# Patient Record
Sex: Male | Born: 1945 | Race: White | Hispanic: No | Marital: Married | Smoking: Never smoker
Health system: Southern US, Community
[De-identification: ages and names within clinical notes are randomized; demographics above are authoritative.]

## PROBLEM LIST (undated history)

## (undated) DIAGNOSIS — R351 Nocturia: Secondary | ICD-10-CM

## (undated) DIAGNOSIS — R972 Elevated prostate specific antigen [PSA]: Principal | ICD-10-CM

## (undated) DIAGNOSIS — F329 Major depressive disorder, single episode, unspecified: Secondary | ICD-10-CM

## (undated) DIAGNOSIS — L719 Rosacea, unspecified: Secondary | ICD-10-CM

## (undated) DIAGNOSIS — I10 Essential (primary) hypertension: Secondary | ICD-10-CM

## (undated) DIAGNOSIS — F32A Depression, unspecified: Secondary | ICD-10-CM

## (undated) DIAGNOSIS — E785 Hyperlipidemia, unspecified: Secondary | ICD-10-CM

## (undated) DIAGNOSIS — N401 Enlarged prostate with lower urinary tract symptoms: Secondary | ICD-10-CM

## (undated) HISTORY — DX: Elevated prostate specific antigen (PSA): R97.20

## (undated) HISTORY — DX: Essential (primary) hypertension: I10

## (undated) HISTORY — DX: Nocturia: R35.1

## (undated) HISTORY — DX: Major depressive disorder, single episode, unspecified: F32.9

## (undated) HISTORY — DX: Hyperlipidemia, unspecified: E78.5

## (undated) HISTORY — PX: WISDOM TOOTH EXTRACTION: SHX21

## (undated) HISTORY — DX: Benign prostatic hyperplasia with lower urinary tract symptoms: N40.1

## (undated) HISTORY — DX: Depression, unspecified: F32.A

## (undated) HISTORY — PX: TONSILLECTOMY: SUR1361

## (undated) HISTORY — DX: Rosacea, unspecified: L71.9

## (undated) HISTORY — PX: FLEXIBLE SIGMOIDOSCOPY: SHX1649

---

## 2003-04-25 LAB — HM SIGMOIDOSCOPY

## 2004-04-02 ENCOUNTER — Encounter: Payer: Self-pay | Admitting: Internal Medicine

## 2004-07-04 ENCOUNTER — Ambulatory Visit: Payer: Self-pay | Admitting: Internal Medicine

## 2004-10-02 ENCOUNTER — Ambulatory Visit: Payer: Self-pay | Admitting: Internal Medicine

## 2004-10-10 ENCOUNTER — Ambulatory Visit: Payer: Self-pay | Admitting: Internal Medicine

## 2005-04-09 ENCOUNTER — Ambulatory Visit: Payer: Self-pay | Admitting: Internal Medicine

## 2005-04-16 ENCOUNTER — Ambulatory Visit: Payer: Self-pay | Admitting: Internal Medicine

## 2005-06-30 ENCOUNTER — Ambulatory Visit: Payer: Self-pay | Admitting: Internal Medicine

## 2005-07-23 ENCOUNTER — Ambulatory Visit: Payer: Self-pay | Admitting: Internal Medicine

## 2005-11-04 ENCOUNTER — Ambulatory Visit: Payer: Self-pay | Admitting: Internal Medicine

## 2005-11-11 ENCOUNTER — Ambulatory Visit: Payer: Self-pay | Admitting: Internal Medicine

## 2006-03-03 ENCOUNTER — Ambulatory Visit: Payer: Self-pay | Admitting: Internal Medicine

## 2006-03-10 ENCOUNTER — Ambulatory Visit: Payer: Self-pay | Admitting: Internal Medicine

## 2006-03-24 ENCOUNTER — Ambulatory Visit: Payer: Self-pay | Admitting: Internal Medicine

## 2006-04-09 ENCOUNTER — Ambulatory Visit: Payer: Self-pay | Admitting: Internal Medicine

## 2006-04-15 ENCOUNTER — Encounter: Admission: RE | Admit: 2006-04-15 | Discharge: 2006-04-15 | Payer: Self-pay | Admitting: Internal Medicine

## 2006-05-18 ENCOUNTER — Ambulatory Visit: Payer: Self-pay | Admitting: Internal Medicine

## 2006-06-15 ENCOUNTER — Ambulatory Visit: Payer: Self-pay | Admitting: Internal Medicine

## 2006-06-15 LAB — CONVERTED CEMR LAB
CO2: 29 meq/L (ref 19–32)
Creatinine, Ser: 1.7 mg/dL — ABNORMAL HIGH (ref 0.4–1.5)
Potassium: 5.2 meq/L — ABNORMAL HIGH (ref 3.5–5.1)
Sodium: 146 meq/L — ABNORMAL HIGH (ref 135–145)

## 2006-06-22 ENCOUNTER — Ambulatory Visit: Payer: Self-pay | Admitting: Internal Medicine

## 2006-07-21 ENCOUNTER — Ambulatory Visit: Payer: Self-pay | Admitting: Internal Medicine

## 2007-02-25 ENCOUNTER — Ambulatory Visit: Payer: Self-pay | Admitting: Internal Medicine

## 2007-02-25 LAB — CONVERTED CEMR LAB
Albumin: 4.4 g/dL (ref 3.5–5.2)
GFR calc Af Amer: 47 mL/min
GFR calc non Af Amer: 39 mL/min
HDL: 43.6 mg/dL (ref >39.0)
Hgb A1c MFr Bld: 6.1 % — ABNORMAL HIGH (ref 4.6–6.0)
LDL Cholesterol: 87 mg/dL (ref 0–99)
Potassium: 4.4 meq/L (ref 3.5–5.1)
Sodium: 142 meq/L (ref 135–145)
Total Bilirubin: 0.8 mg/dL (ref 0.3–1.2)
Total CHOL/HDL Ratio: 3.8
Triglycerides: 172 mg/dL — ABNORMAL HIGH (ref 0–149)
VLDL: 34 mg/dL (ref 0–40)

## 2007-05-19 ENCOUNTER — Encounter: Payer: Self-pay | Admitting: Internal Medicine

## 2007-05-20 ENCOUNTER — Encounter: Payer: Self-pay | Admitting: Internal Medicine

## 2007-05-24 DIAGNOSIS — E785 Hyperlipidemia, unspecified: Secondary | ICD-10-CM | POA: Insufficient documentation

## 2007-05-24 DIAGNOSIS — F329 Major depressive disorder, single episode, unspecified: Secondary | ICD-10-CM | POA: Insufficient documentation

## 2007-05-24 DIAGNOSIS — F3289 Other specified depressive episodes: Secondary | ICD-10-CM | POA: Insufficient documentation

## 2007-05-31 ENCOUNTER — Ambulatory Visit: Payer: Self-pay | Admitting: Internal Medicine

## 2007-05-31 DIAGNOSIS — I10 Essential (primary) hypertension: Secondary | ICD-10-CM | POA: Insufficient documentation

## 2007-05-31 LAB — CONVERTED CEMR LAB
Cholesterol, target level: 200 mg/dL
HDL goal, serum: 40 mg/dL

## 2007-09-27 ENCOUNTER — Ambulatory Visit: Payer: Self-pay | Admitting: Internal Medicine

## 2007-09-27 LAB — CONVERTED CEMR LAB
AST: 21 units/L (ref 0–37)
Basophils Relative: 0.1 % (ref 0.0–1.0)
Bilirubin, Direct: 0.2 mg/dL (ref 0.0–0.3)
CO2: 28 meq/L (ref 19–32)
Chloride: 106 meq/L (ref 96–112)
Creatinine, Ser: 1.8 mg/dL — ABNORMAL HIGH (ref 0.4–1.5)
Eosinophils Relative: 1.5 % (ref 0.0–5.0)
Glucose, Bld: 102 mg/dL — ABNORMAL HIGH (ref 70–99)
Glucose, Urine, Semiquant: NEGATIVE
HCT: 37.8 % — ABNORMAL LOW (ref 39.0–52.0)
MCV: 88.2 fL (ref 78.0–100.0)
Neutrophils Relative %: 67.3 % (ref 43.0–77.0)
PSA: 1.06 ng/mL (ref 0.10–4.00)
Protein, U semiquant: NEGATIVE
RBC: 4.29 M/uL (ref 4.22–5.81)
RDW: 12.6 % (ref 11.5–14.6)
Sodium: 143 meq/L (ref 135–145)
Total Bilirubin: 0.7 mg/dL (ref 0.3–1.2)
Total CHOL/HDL Ratio: 3.9
WBC Urine, dipstick: NEGATIVE
WBC: 7.1 10*3/uL (ref 4.5–10.5)
pH: 5.5

## 2007-10-04 ENCOUNTER — Ambulatory Visit: Payer: Self-pay | Admitting: Internal Medicine

## 2008-03-29 ENCOUNTER — Ambulatory Visit: Payer: Self-pay | Admitting: Internal Medicine

## 2008-03-29 LAB — CONVERTED CEMR LAB
ALT: 20 units/L (ref 0–53)
AST: 24 units/L (ref 0–37)
Albumin: 4.3 g/dL (ref 3.5–5.2)
BUN: 27 mg/dL — ABNORMAL HIGH (ref 6–23)
Basophils Absolute: 0 10*3/uL (ref 0.0–0.1)
Basophils Relative: 0.3 % (ref 0.0–3.0)
CO2: 28 meq/L (ref 19–32)
Calcium: 9.4 mg/dL (ref 8.4–10.5)
Chloride: 104 meq/L (ref 96–112)
Creatinine, Ser: 1.9 mg/dL — ABNORMAL HIGH (ref 0.4–1.5)
Direct LDL: 116.8 mg/dL
Eosinophils Relative: 1.8 % (ref 0.0–5.0)
Hemoglobin: 12.8 g/dL — ABNORMAL LOW (ref 13.0–17.0)
Lymphocytes Relative: 29.2 % (ref 12.0–46.0)
MCHC: 34.5 g/dL (ref 30.0–36.0)
MCV: 89.8 fL (ref 78.0–100.0)
Neutro Abs: 3.7 10*3/uL (ref 1.4–7.7)
Nitrite: NEGATIVE
PSA: 1.13 ng/mL (ref 0.10–4.00)
Protein, U semiquant: NEGATIVE
RBC: 4.13 M/uL — ABNORMAL LOW (ref 4.22–5.81)
Specific Gravity, Urine: 1.02
Total Bilirubin: 0.8 mg/dL (ref 0.3–1.2)
Total Protein: 7.1 g/dL (ref 6.0–8.3)
Urobilinogen, UA: 0.2
VLDL: 47 mg/dL — ABNORMAL HIGH (ref 0–40)
WBC: 5.9 10*3/uL (ref 4.5–10.5)
pH: 5.5

## 2008-04-05 ENCOUNTER — Ambulatory Visit: Payer: Self-pay | Admitting: Internal Medicine

## 2008-04-05 DIAGNOSIS — N183 Chronic kidney disease, stage 3 (moderate): Secondary | ICD-10-CM

## 2008-07-04 ENCOUNTER — Ambulatory Visit: Payer: Self-pay | Admitting: Internal Medicine

## 2008-07-04 LAB — CONVERTED CEMR LAB
ALT: 21 units/L (ref 0–53)
AST: 27 units/L (ref 0–37)
Bilirubin, Direct: 0.1 mg/dL (ref 0.0–0.3)
CO2: 27 meq/L (ref 19–32)
Chloride: 107 meq/L (ref 96–112)
Creatinine, Ser: 2 mg/dL — ABNORMAL HIGH (ref 0.4–1.5)
GFR calc non Af Amer: 36 mL/min
LDL Cholesterol: 85 mg/dL (ref 0–99)
Potassium: 5.1 meq/L (ref 3.5–5.1)
Sodium: 142 meq/L (ref 135–145)
Total Bilirubin: 0.7 mg/dL (ref 0.3–1.2)
Total CHOL/HDL Ratio: 3.7
Triglycerides: 144 mg/dL (ref 0–149)

## 2008-07-11 ENCOUNTER — Ambulatory Visit: Payer: Self-pay | Admitting: Internal Medicine

## 2008-11-09 ENCOUNTER — Ambulatory Visit: Payer: Self-pay | Admitting: Internal Medicine

## 2008-11-09 LAB — CONVERTED CEMR LAB
ALT: 21 units/L (ref 0–53)
Alkaline Phosphatase: 23 units/L — ABNORMAL LOW (ref 39–117)
BUN: 22 mg/dL (ref 6–23)
Bilirubin, Direct: 0 mg/dL (ref 0.0–0.3)
CO2: 28 meq/L (ref 19–32)
Calcium: 9.3 mg/dL (ref 8.4–10.5)
GFR calc non Af Amer: 40.94 mL/min (ref >60)
Glucose, Bld: 99 mg/dL (ref 70–99)
HDL: 42.8 mg/dL (ref >39.00)
Sodium: 142 meq/L (ref 135–145)
Total Bilirubin: 0.8 mg/dL (ref 0.3–1.2)
Total Protein: 6.9 g/dL (ref 6.0–8.3)
VLDL: 42.6 mg/dL — ABNORMAL HIGH (ref 0.0–40.0)

## 2008-11-16 ENCOUNTER — Ambulatory Visit: Payer: Self-pay | Admitting: Internal Medicine

## 2008-11-16 DIAGNOSIS — L719 Rosacea, unspecified: Secondary | ICD-10-CM | POA: Insufficient documentation

## 2009-03-16 ENCOUNTER — Telehealth: Payer: Self-pay | Admitting: Internal Medicine

## 2009-03-16 ENCOUNTER — Encounter: Payer: Self-pay | Admitting: Internal Medicine

## 2009-04-03 ENCOUNTER — Ambulatory Visit: Payer: Self-pay | Admitting: Internal Medicine

## 2009-04-03 LAB — CONVERTED CEMR LAB
AST: 25 units/L (ref 0–37)
BUN: 33 mg/dL — ABNORMAL HIGH (ref 6–23)
Basophils Absolute: 0 10*3/uL (ref 0.0–0.1)
Bilirubin Urine: NEGATIVE
Bilirubin, Direct: 0.1 mg/dL (ref 0.0–0.3)
Blood in Urine, dipstick: NEGATIVE
Calcium: 9.4 mg/dL (ref 8.4–10.5)
Cholesterol: 162 mg/dL (ref 0–200)
Creatinine, Ser: 2.2 mg/dL — ABNORMAL HIGH (ref 0.4–1.5)
GFR calc non Af Amer: 32.43 mL/min (ref >60)
Glucose, Bld: 110 mg/dL — ABNORMAL HIGH (ref 70–99)
Glucose, Urine, Semiquant: NEGATIVE
HDL: 41.1 mg/dL (ref >39.00)
LDL Cholesterol: 86 mg/dL (ref 0–99)
Lymphocytes Relative: 24.5 % (ref 12.0–46.0)
Lymphs Abs: 1.7 10*3/uL (ref 0.7–4.0)
Monocytes Relative: 7.8 % (ref 3.0–12.0)
Neutrophils Relative %: 66.1 % (ref 43.0–77.0)
Platelets: 344 10*3/uL (ref 150.0–400.0)
Protein, U semiquant: NEGATIVE
RDW: 12.9 % (ref 11.5–14.6)
TSH: 3.13 microintl units/mL (ref 0.35–5.50)
Total Bilirubin: 0.7 mg/dL (ref 0.3–1.2)
Urobilinogen, UA: 0.2
VLDL: 34.8 mg/dL (ref 0.0–40.0)
WBC: 6.9 10*3/uL (ref 4.5–10.5)
pH: 5.5

## 2009-04-13 ENCOUNTER — Ambulatory Visit: Payer: Self-pay | Admitting: Internal Medicine

## 2009-05-11 ENCOUNTER — Ambulatory Visit: Payer: Self-pay | Admitting: Internal Medicine

## 2009-05-11 LAB — CONVERTED CEMR LAB
BUN: 19 mg/dL (ref 6–23)
CO2: 30 meq/L (ref 19–32)
Chloride: 108 meq/L (ref 96–112)
Potassium: 4.2 meq/L (ref 3.5–5.1)

## 2009-05-17 ENCOUNTER — Ambulatory Visit: Payer: Self-pay | Admitting: Internal Medicine

## 2009-05-17 ENCOUNTER — Telehealth: Payer: Self-pay | Admitting: Internal Medicine

## 2009-06-21 ENCOUNTER — Ambulatory Visit: Payer: Self-pay | Admitting: Internal Medicine

## 2009-06-21 DIAGNOSIS — M109 Gout, unspecified: Secondary | ICD-10-CM

## 2009-06-27 LAB — CONVERTED CEMR LAB: Uric Acid, Serum: 7.1 mg/dL (ref 4.0–7.8)

## 2009-09-12 ENCOUNTER — Ambulatory Visit: Payer: Self-pay | Admitting: Internal Medicine

## 2009-09-12 LAB — CONVERTED CEMR LAB
CO2: 29 meq/L (ref 19–32)
Calcium: 9.3 mg/dL (ref 8.4–10.5)
Cholesterol: 181 mg/dL (ref 0–200)
GFR calc non Af Amer: 43.61 mL/min (ref >60)
HDL: 45.4 mg/dL (ref >39.00)
Sodium: 142 meq/L (ref 135–145)

## 2009-09-24 ENCOUNTER — Telehealth: Payer: Self-pay | Admitting: Internal Medicine

## 2009-12-05 ENCOUNTER — Ambulatory Visit: Payer: Self-pay | Admitting: Internal Medicine

## 2009-12-05 LAB — CONVERTED CEMR LAB
GFR calc non Af Amer: 40.8 mL/min (ref >60)
Potassium: 4.8 meq/L (ref 3.5–5.1)
Sodium: 142 meq/L (ref 135–145)

## 2009-12-12 ENCOUNTER — Ambulatory Visit: Payer: Self-pay | Admitting: Internal Medicine

## 2010-04-11 ENCOUNTER — Ambulatory Visit: Payer: Self-pay | Admitting: Internal Medicine

## 2010-04-11 LAB — CONVERTED CEMR LAB
AST: 24 units/L (ref 0–37)
BUN: 27 mg/dL — ABNORMAL HIGH (ref 6–23)
Basophils Absolute: 0 10*3/uL (ref 0.0–0.1)
Blood in Urine, dipstick: NEGATIVE
Calcium: 9.6 mg/dL (ref 8.4–10.5)
Direct LDL: 108.3 mg/dL
Eosinophils Absolute: 0.2 10*3/uL (ref 0.0–0.7)
GFR calc non Af Amer: 37.38 mL/min (ref >60)
Glucose, Bld: 101 mg/dL — ABNORMAL HIGH (ref 70–99)
Glucose, Urine, Semiquant: NEGATIVE
HCT: 36.3 % — ABNORMAL LOW (ref 39.0–52.0)
HDL: 41.5 mg/dL (ref >39.00)
Ketones, urine, test strip: NEGATIVE
Lymphocytes Relative: 27.5 % (ref 12.0–46.0)
Lymphs Abs: 1.6 10*3/uL (ref 0.7–4.0)
Monocytes Relative: 8 % (ref 3.0–12.0)
PSA: 4.31 ng/mL — ABNORMAL HIGH (ref 0.10–4.00)
Platelets: 325 10*3/uL (ref 150.0–400.0)
RDW: 14 % (ref 11.5–14.6)
Specific Gravity, Urine: 1.02
TSH: 3 microintl units/mL (ref 0.35–5.50)
Total Bilirubin: 0.6 mg/dL (ref 0.3–1.2)
Triglycerides: 224 mg/dL — ABNORMAL HIGH (ref 0.0–149.0)
pH: 6

## 2010-04-16 ENCOUNTER — Ambulatory Visit: Payer: Self-pay | Admitting: Internal Medicine

## 2010-04-16 DIAGNOSIS — R972 Elevated prostate specific antigen [PSA]: Secondary | ICD-10-CM | POA: Insufficient documentation

## 2010-04-16 HISTORY — DX: Elevated prostate specific antigen (PSA): R97.20

## 2010-05-28 ENCOUNTER — Ambulatory Visit: Payer: Self-pay | Admitting: Internal Medicine

## 2010-05-28 LAB — CONVERTED CEMR LAB: PSA: 1.64 ng/mL (ref 0.10–4.00)

## 2010-06-06 ENCOUNTER — Ambulatory Visit: Payer: Self-pay | Admitting: Internal Medicine

## 2010-09-25 NOTE — Assessment & Plan Note (Signed)
Summary: 3 mo rov/mm---PTS WIFE [facility] // RS   Vital Signs:  Patient profile:   65 year old male Height:      68 inches Weight:      183 pounds BMI:     27.93 Temp:     98.2 degrees F oral Pulse rate:   68 / minute Resp:     14 per minute BP sitting:   120 / 70  (left arm)  Vitals Entered By: Willy Eddy, LPN (September 12, 2009 11:29 AM) CC: roa, Hypertension Management   CC:  roa and Hypertension Management.  History of Present Illness: MOTHER WENT INTO ASSISTED LIVING INCREASED STRESS  Hypertension History:      He denies headache, chest pain, palpitations, dyspnea with exertion, orthopnea, PND, peripheral edema, visual symptoms, neurologic problems, syncope, and side effects from treatment.  HX OF RENAL INSUFFICIENCY.        Positive major cardiovascular risk factors include male age 64 years old or older, hyperlipidemia, and hypertension.  Negative major cardiovascular risk factors include no history of diabetes and non-tobacco-user status.        Positive history for target organ damage include renal insufficiency.  Further assessment for target organ damage reveals no history of ASHD, stroke/TIA, or peripheral vascular disease.     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 Once Daily 2)  Adult Aspirin Low Strength 81 Mg  Tbdp (Aspirin) .... Once Daily 3)  Benicar 40 Mg Tabs (Olmesartan Medoxomil) .... One By Mouth Daily 4)  Folic Acid 1 Mg  Tabs (Folic Acid) .... Once Daily 5)  Vitamin B-6 Cr 200 Mg  Tbcr (Pyridoxine Hcl) .... Once  Daily 6)  Therapeutic Multivitamin   Tabs (Multiple Vitamin) .... Once Daily 7)  Tazorac 0.1 %  Crea (Tazarotene) .... Apply To Face Daily 8)  Lipitor 40 Mg  Tabs (Atorvastatin Calcium) .... One By Mouth Daily 9)  Clindamycin Phos-Benzoyl Perox 1-5 % Gel (Clindamycin Phos-Benzoyl Perox) .... Apply To Face As Needed 10)  Colchicine 0.6 Mg Tabs (Colchicine) .... One By Mouth Two Times A Day  For Acute Gour  Current Medications (verified): 1)  Tricor 145 Mg  Tabs (Fenofibrate) .... Once Daily 2)  Adult Aspirin Low Strength 81 Mg  Tbdp (Aspirin) .... Once Daily 3)  Benicar 40 Mg Tabs (Olmesartan Medoxomil) .... One By Mouth Daily 4)  Folic Acid 1 Mg  Tabs (Folic Acid) .... Once Daily 5)  Vitamin B-6 Cr 200 Mg  Tbcr (Pyridoxine Hcl) .... Once Daily 6)  Therapeutic Multivitamin   Tabs (Multiple Vitamin) .... Once Daily 7)  Lipitor 40 Mg  Tabs (Atorvastatin Calcium) .... One By Mouth Daily 8)  Clindamycin Phos-Benzoyl Perox 1-5 % Gel (Clindamycin Phos-Benzoyl Perox) .... Apply To Face As Needed 9)  Colchicine 0.6 Mg Tabs (Colchicine) .... One By Mouth Two Times A Day  For Acute Gour  Allergies (verified): No Known Drug Allergies  Past History:  Family History: Last updated: 05/24/2007 Family History of Cardiovascular disorder  Social History: Last updated: 05/24/2007 Occupation: Married Never Smoked  Alcohol use-yes  Risk Factors: Smoking Status: never (09/12/2009)  Past medical, surgical, family and social histories (including risk factors) reviewed, and no changes noted (except as noted below).  Past Medical History: Reviewed history from 05/31/2007 and no changes required. Rosacea Depression Hyperlipidemia Hypertension  Past Surgical History: Reviewed history from 05/24/2007 and no changes required. Flexible Sigmoidoscopy Wisdom Teeth  Family History: Reviewed history from 05/24/2007 and no changes required. Family History of Cardiovascular disorder  Social  History: Reviewed history from 05/24/2007 and no changes required. Occupation: Married Never Smoked Alcohol use-yes  Review of Systems  The patient denies anorexia, fever, weight loss, weight gain, vision loss, decreased hearing, hoarseness, chest pain, syncope, dyspnea on exertion, peripheral edema, prolonged cough, headaches, hemoptysis, abdominal pain, melena, hematochezia, severe indigestion/heartburn, hematuria, incontinence, genital sores, muscle weakness, suspicious skin lesions, transient blindness, difficulty walking, depression, unusual weight change, abnormal bleeding, enlarged lymph nodes, angioedema, and breast masses.    Physical Exam  General:  Well-developed,well-nourished,in no acute distress; alert,appropriate and cooperative throughout examination Head:  normocephalic and male-pattern balding.   Eyes:  pupils equal and pupils round.   Ears:  R ear normal and L ear normal.   Mouth:  Oral mucosa and oropharynx without lesions or exudates.  Teeth in good repair. Neck:  No deformities, masses, or tenderness noted. Lungs:  normal respiratory effort and no wheezes.   Heart:  normal rate and regular rhythm.   Abdomen:  soft and non-tender.   Msk:  normal ROM, no joint tenderness, no joint swelling, no joint warmth, and no redness over joints.   Pulses:  R and L carotid,radial,femoral,dorsalis pedis and posterior tibial pulses are full and equal bilaterally Extremities:  trace left pedal edema.   Neurologic:  alert & oriented X3 and finger-to-nose normal.     Impression & Recommendations:  Problem # 1:  CHRONIC KIDNEY DISEASE STAGE II (MILD) (ICD-585.2) Assessment Unchanged  Labs Reviewed: BUN: 19 (05/11/2009)   Cr: 2.0 (05/11/2009)    Hgb: 12.9 (04/03/2009)   Hct: 37.4 (04/03/2009)   Ca++: 9.4 (05/11/2009)    TP: 7.2 (04/03/2009)   Alb: 4.4 (04/03/2009)  Problem # 2:  DISEASE, HTN CKD, NOS, W/CKD, STG I-IV/UNSPC (ICD-403.90) Assessment: Unchanged  His updated  medication list for this problem includes:    Benicar 40 Mg Tabs (Olmesartan medoxomil) ..... One by mouth daily  Orders: TLB-BMP (Basic Metabolic Panel-BMET) (80048-METABOL)  BP today: 120/70 Prior BP: 110/80 (06/21/2009)  Prior 10 Yr Risk Heart Disease: 7 % (07/11/2008)  Labs Reviewed: K+: 4.2 (05/11/2009) Creat: : 2.0 (05/11/2009)   Chol: 162 (04/03/2009)   HDL: 41.10 (04/03/2009)   LDL: 86 (04/03/2009)   TG: 174.0 (04/03/2009)  Problem # 3:  ACUTE GOUTY ARTHROPATHY (ICD-274.01)  MONITER URIC ACID His updated medication list for this problem includes:    Colchicine 0.6 Mg Tabs (Colchicine) ..... One by mouth two times a day  for acute gour  Orders: TLB-Uric Acid, Blood (84550-URIC)  Elevate extremity; warm compresses, symptomatic relief and medication as directed.   Complete Medication List: 1)  Tricor 145 Mg Tabs (Fenofibrate) .... Once daily 2)  Adult Aspirin Low Strength 81 Mg Tbdp (Aspirin) .... Once daily 3)  Benicar 40 Mg Tabs (Olmesartan medoxomil) .... One by mouth daily 4)  Folic Acid 1 Mg Tabs (Folic acid) .... Once daily 5)  Vitamin B-6 Cr 200 Mg Tbcr (Pyridoxine hcl) .... Once daily 6)  Therapeutic Multivitamin Tabs (Multiple vitamin) .... Once daily 7)  Lipitor 40 Mg Tabs (Atorvastatin calcium) .Marland KitchenMarland KitchenMarland Kitchen  One by mouth daily 8)  Clindamycin Phos-benzoyl Perox 1-5 % Gel (Clindamycin phos-benzoyl perox) .... Apply to face as needed 9)  Colchicine 0.6 Mg Tabs (Colchicine) .... One by mouth two times a day  for acute gour  Other Orders: Venipuncture (93235) TLB-Cholesterol, HDL (83718-HDL) TLB-Cholesterol, Direct LDL (83721-DIRLDL) TLB-Cholesterol, Total (82465-CHO)  Hypertension Assessment/Plan:      The patient's hypertensive risk group is category C: Target organ damage and/or diabetes.  His calculated 10 year risk of coronary heart disease is 7 %.  Today's blood pressure is 120/70.  His blood pressure goal is < 140/90.  Patient Instructions: 1)  Please schedule  a follow-up appointment in 3 months. 2)  BMP prior to visit, ICD-9:403.9

## 2010-09-25 NOTE — Progress Notes (Signed)
Summary: zpack?  Phone Note Call from Patient   Caller: Patient Call For: Stacie Glaze MD Summary of Call: Pt is coughing up green sputum, and would like a Zpack.  [facility].  Not sure about fever, but feels ill all over? [phone] Initial call taken by: Lynann Beaver CMA,  September 24, 2009 4:29 PM  Follow-up for Phone Call        per dr Lovell Sheehan- may have z pack Follow-up by: Willy Eddy, LPN,  September 24, 2009 4:41 PM  Additional Follow-up for Phone Call Additional follow up Details #1::        Phone Call Completed Additional Follow-up by: Rudy Jew, RN,  September 24, 2009 4:50 PM    New/Updated Medications: ZITHROMAX Z-PAK 250 MG TABS (AZITHROMYCIN) As directed. Prescriptions: ZITHROMAX Z-PAK 250 MG TABS (AZITHROMYCIN) As directed.  #1 x 0   Entered by:   Rudy Jew, RN   Authorized by:   Stacie Glaze MD   Signed by:   Rudy Jew, RN on 09/24/2009   Method used:   [facility]        [phone] (retail)       [address]       [place], [place]  [address]       Ph: [phone]       Fax: [phone]   RxID:   [id number]

## 2010-09-25 NOTE — Assessment & Plan Note (Signed)
Summary: CPX//CCM   Vital Signs:  Patient profile:   65 year old male Height:      68 inches Weight:      182 pounds BMI:     27.77 Temp:     98.1 degrees F oral Pulse rate:   72 / minute Resp:     14 per minute BP sitting:   110 / 64  (left arm)  Vitals Entered By: Willy Eddy, LPN (April 16, 2010 11:26 AM) CC: cpx- flex in 2002, Hypertension Management Is Patient Diabetic? No   CC:  cpx- flex in 2002 and Hypertension Management.  History of Present Illness: The pt was asked about all immunizations, health maint. services that are appropriate to their age and was given guidance on diet exercize  and weight management  rise in PSA without symptoms no back pain, dyuria or freqency milde progressive anemia with hx of renal dz blood pressure stable  Hypertension History:      He denies headache, chest pain, palpitations, dyspnea with exertion, orthopnea, PND, peripheral edema, visual symptoms, neurologic problems, syncope, and side effects from treatment.        Positive major cardiovascular risk factors include male age 79 years old or older, hyperlipidemia, and hypertension.  Negative major cardiovascular risk factors include no history of diabetes and non-tobacco-user status.        Positive history for target organ damage include renal insufficiency.  Further assessment for target organ damage reveals no history of ASHD, stroke/TIA, or peripheral vascular disease.     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 Once Daily 2)  Adult Aspirin Low Strength 81 Mg  Tbdp (Aspirin) .... Once Daily 3)  Benicar 40 Mg Tabs (Olmesartan Medoxomil) .... One By Mouth Daily 4)  Folic Acid 1 Mg  Tabs (Folic Acid) .... Once Daily 5)  Vitamin B-6 Cr 200 Mg  Tbcr (Pyridoxine Hcl) .... Once Daily 6)  Therapeutic Multivitamin   Tabs (Multiple Vitamin) .... Once Daily 7)  Lipitor 40 Mg  Tabs (Atorvastatin Calcium) .... One By Mouth Daily 8)  Clindamycin Phos-Benzoyl Perox 1-5 % Gel (Clindamycin Phos-Benzoyl Perox) .... Apply To Face As Needed 9)  Colcrys 0.6 Mg Tabs (Colchicine) .... One By Mouth  Two Times A Day Prn  Current Medications (verified): 1)  Tricor 145 Mg  Tabs (Fenofibrate) .... Once Daily 2)  Adult Aspirin Low Strength 81 Mg  Tbdp (Aspirin) .... Once Daily 3)  Benicar 40 Mg Tabs (Olmesartan Medoxomil) .... One By Mouth Daily 4)  Folic Acid 1 Mg  Tabs (Folic Acid) .... Once Daily 5)  Vitamin B-6 Cr 200 Mg  Tbcr (Pyridoxine Hcl) .... Once Daily 6)  Therapeutic Multivitamin   Tabs (Multiple Vitamin) .... Once Daily  7)  Lipitor 40 Mg  Tabs (Atorvastatin Calcium) .... One By Mouth Daily 8)  Clindamycin Phos-Benzoyl Perox 1-5 % Gel (Clindamycin Phos-Benzoyl Perox) .... Apply To Face As Needed 9)  Colcrys 0.6 Mg Tabs (Colchicine) .... One By Mouth  Two Times A Day Prn 10)  Ciprofloxacin Hcl 250 Mg Tabs (Ciprofloxacin Hcl) .... One By Mouth Two Times A Day For 21 Days  Allergies (verified): No Known Drug Allergies  Past History:  Family History: Last updated: 05/24/2007 Family History of Cardiovascular disorder  Social History: Last updated: 05/24/2007 Occupation: Married Never Smoked Alcohol  use-yes  Risk Factors: Smoking Status: never (04/16/2010)  Past medical, surgical, family and social histories (including risk factors) reviewed, and no changes noted (except as noted below).  Past Medical History: Reviewed history from 05/31/2007 and no changes required. Rosacea Depression Hyperlipidemia Hypertension  Past Surgical History: Reviewed history from 05/24/2007 and no changes required. Flexible Sigmoidoscopy Wisdom Teeth  Family History: Reviewed history from 05/24/2007 and no changes required. Family History of Cardiovascular disorder  Social History: Reviewed history from 05/24/2007 and no changes required. Occupation: Married Never Smoked Alcohol use-yes  Review of Systems  The patient denies anorexia, fever, weight loss, weight gain, vision loss, decreased hearing, hoarseness, chest pain, syncope, dyspnea on exertion, peripheral edema, prolonged cough, headaches, hemoptysis, abdominal pain, melena, hematochezia, severe indigestion/heartburn, hematuria, incontinence, genital sores, muscle weakness, suspicious skin lesions, transient blindness, difficulty walking, depression, unusual weight change, abnormal bleeding, enlarged lymph nodes, angioedema, and breast masses.    Physical Exam  General:  Well-developed,well-nourished,in no acute distress; alert,appropriate and cooperative throughout examination Head:  normocephalic and male-pattern balding.   Eyes:  pupils equal and pupils round.   Ears:  R ear normal and L ear normal.   Mouth:  Oral mucosa and oropharynx without lesions or exudates.  Teeth in good repair. Neck:  No deformities, masses, or tenderness noted. Lungs:  normal respiratory effort and no wheezes.   Heart:  normal rate and regular rhythm.   Abdomen:  soft and non-tender.   Genitalia:  circumcised and no urethral discharge.   Prostate:  no gland enlargement, boggy, and 1+ enlarged.   Msk:  normal ROM, no joint tenderness, no joint  swelling, no joint warmth, and no redness over joints.   Pulses:  R and L carotid,radial,femoral,dorsalis pedis and posterior tibial pulses are full and equal bilaterally Extremities:  trace left pedal edema.   Neurologic:  alert & oriented X3 and finger-to-nose normal.     Impression & Recommendations:  Problem # 1:  PHYSICAL EXAMINATION (ICD-V70.0) The pt was asked about all immunizations, health maint. services that are appropriate to their age and was given guidance on diet exercize  and weight management  Td Booster: Tdap (04/05/2008)   Flu Vax: Fluvax 3+ (05/17/2009)   Chol: 181 (09/12/2009)   HDL: 45.40 (09/12/2009)   LDL: 86 (04/03/2009)   TG: 174.0 (04/03/2009) TSH: 3.13 (04/03/2009)   HgbA1C: 6.1 (02/25/2007)   PSA: 1.17 (04/03/2009)  Discussed using sunscreen, use of alcohol, drug use, self testicular exam, routine dental care, routine eye care, routine physical exam, seat belts, multiple vitamins, osteoporosis prevention, adequate calcium intake in diet, and recommendations for immunizations.  Discussed exercise and checking cholesterol.  Discussed gun safety, safe sex, and contraception. Also recommend checking PSA.  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 One by mouth daily  BP today: 110/64 Prior BP: 110/70 (12/12/2009)  Prior 10 Yr Risk Heart Disease: 6 % (12/12/2009)  Labs Reviewed: K+: 4.8 (12/05/2009) Creat: : 1.8 (12/05/2009)   Chol: 181 (09/12/2009)   HDL: 45.40 (09/12/2009)   LDL: 86 (04/03/2009)   TG: 174.0 (04/03/2009)  Problem # 4:  PSA, INCREASED  (ICD-790.93) treat with cipro 250 two times a day and repeat  Complete Medication List: 1)  Tricor 145 Mg Tabs (Fenofibrate) .... Once daily 2)  Adult Aspirin Low Strength 81 Mg Tbdp (Aspirin) .... Once daily 3)  Benicar 40 Mg Tabs (Olmesartan medoxomil) .... One by mouth daily 4)  Folic Acid 1 Mg Tabs (Folic acid) .... Once daily 5)  Vitamin B-6 Cr 200 Mg Tbcr (Pyridoxine hcl) .... Once daily 6)  Therapeutic Multivitamin Tabs (Multiple vitamin) .... Once daily 7)  Lipitor 40 Mg Tabs (Atorvastatin calcium) .... One by mouth daily 8)  Clindamycin Phos-benzoyl Perox 1-5 % Gel (Clindamycin phos-benzoyl perox) .... Apply to face as needed 9)  Colcrys 0.6 Mg Tabs (Colchicine) .... One by mouth  two times a day prn 10)  Ciprofloxacin Hcl 250 Mg Tabs (Ciprofloxacin hcl) .... One by mouth two times a day for 21 days  Hypertension Assessment/Plan:      The patient's hypertensive risk group is category C: Target organ damage and/or diabetes.  His calculated 10 year risk of coronary heart disease is 6 %.  Today's blood pressure is 110/64.  His blood pressure goal is < 140/90.  Patient Instructions: 1)  Please schedule a follow-up appointment in 6 weeks. 2)  PSA prior to visit, ICD-9: 790.93 Prescriptions: CIPROFLOXACIN HCL 250 MG TABS (CIPROFLOXACIN HCL) one by mouth two times a day for 21 days  #42 x 0   Entered and Authorized by:   Stacie Glaze MD   Signed by:   Stacie Glaze MD on 04/16/2010   Method used:   [facility]ically to        [phone][facility] (retail)       [address]       [place], [place]  [address]       Ph: [phone]       Fax: [phone]   RxID:   [id number]

## 2010-09-25 NOTE — Assessment & Plan Note (Signed)
Summary: FU ON LAB WORK/NJR   Vital Signs:  Patient profile:   65 year old male Height:      68 inches Weight:      178 pounds BMI:     27.16 Temp:     98.2 degrees F oral Pulse rate:   72 / minute Resp:     14 per minute BP sitting:   120 / 70  (left arm)  Vitals Entered By: Willy Eddy, LPN (June 06, 2010 10:28 AM) CC: roa labs, Hypertension Management Is Patient Diabetic? No   Primary Care [facility]o:  Stacie Glaze MD  CC:  roa labs and Hypertension Management.  History of Present Illness: the pt present for follow up of PSA monitering for labs follow up for increased stressors with mother in hospital bloos pressure and reanl function stable wit creatinne of 1.9 lipids stable  Hypertension History:      He denies headache, chest pain, palpitations, dyspnea with exertion, orthopnea, PND, peripheral edema, visual symptoms, neurologic problems, syncope, and side effects from treatment.        Positive major cardiovascular risk factors include male age 60 years old or older, hyperlipidemia, and hypertension.  Negative major cardiovascular risk factors include no history of diabetes and non-tobacco-user status.        Positive history for target organ damage include renal insufficiency.  Further assessment for target organ damage reveals no history of ASHD, stroke/TIA, or peripheral vascular disease.     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 Once Daily 2)  Adult Aspirin Low Strength 81 Mg  Tbdp (Aspirin) .... Once Daily 3)  Benicar 40 Mg Tabs (Olmesartan Medoxomil) .... One By Mouth Daily 4)  Folic Acid 1 Mg  Tabs (Folic Acid) .... Once Daily 5)  Vitamin B-6 Cr 200 Mg  Tbcr (Pyridoxine Hcl) .... Once Daily 6)  Therapeutic Multivitamin   Tabs (Multiple Vitamin) .... Once Daily 7)  Lipitor 40 Mg  Tabs (Atorvastatin Calcium) .... One By Mouth Daily 8)  Clindamycin Phos-Benzoyl Perox 1-5 % Gel (Clindamycin Phos-Benzoyl Perox) .... Apply To Face As Needed 9)  Colcrys 0.6 Mg Tabs (Colchicine) .... One By Mouth  Two Times A Day Prn  Current Medications (verified): 1)  Tricor 145 Mg  Tabs (Fenofibrate) .... Once Daily 2)  Adult Aspirin Low Strength 81 Mg  Tbdp (Aspirin) .... Once Daily 3)  Benicar 40 Mg Tabs (Olmesartan Medoxomil) .... One By Mouth Daily 4)  Folic Acid 1 Mg  Tabs (Folic Acid) .... Once Daily 5)  Vitamin B-6 Cr 200 Mg  Tbcr (Pyridoxine Hcl) .... Once  Daily 6)  Therapeutic Multivitamin   Tabs (Multiple Vitamin) .... Once Daily 7)  Lipitor 40 Mg  Tabs (Atorvastatin Calcium) .... One By Mouth Daily 8)  Clindamycin Phos-Benzoyl Perox 1-5 % Gel (Clindamycin Phos-Benzoyl Perox) .... Apply To Face As Needed 9)  Colcrys 0.6 Mg Tabs (Colchicine) .... One By Mouth  Two Times A Day Prn  Allergies (verified): No Known Drug Allergies  Past History:  Family History: Last updated: 05/24/2007 Family History of Cardiovascular disorder  Social History: Last updated: 05/24/2007 Occupation: Married Never Smoked Alcohol use-yes  Risk Factors: Smoking Status: never  (06/06/2010)  Past medical, surgical, family and social histories (including risk factors) reviewed, and no changes noted (except as noted below).  Past Medical History: Reviewed history from 05/31/2007 and no changes required. Rosacea Depression Hyperlipidemia Hypertension  Past Surgical History: Reviewed history from 05/24/2007 and no changes required. Flexible Sigmoidoscopy Wisdom Teeth  Family History: Reviewed history from 05/24/2007 and no changes required. Family History of Cardiovascular disorder  Social History: Reviewed history from 05/24/2007 and no changes required. Occupation: Married Never Smoked Alcohol use-yes  Review of Systems       Flu Vaccine Consent Questions     Do you have a history of severe allergic reactions to this vaccine? no    Any prior history of allergic reactions to egg and/or gelatin? no    Do you have a sensitivity to the preservative Thimersol? no    Do you have a past history of Guillan-Barre Syndrome? no    Do you currently have an acute febrile illness? no    Have you ever had a severe reaction to latex? no    Vaccine information given and explained to patient? yes    Are you currently pregnant? no    Lot Number:[id number]   Exp Date:02/22/2011   Site Given  Left Deltoid IM   Physical Exam  General:  Well-developed,well-nourished,in no acute distress; alert,appropriate and cooperative throughout examination Head:  normocephalic and male-pattern balding.   Eyes:  pupils equal and pupils round.   Ears:  R ear normal and L ear normal.   Mouth:  Oral mucosa and oropharynx without lesions or exudates.  Teeth in good repair. Neck:  No deformities, masses, or tenderness noted. Lungs:  normal respiratory effort and no wheezes.   Heart:  normal rate and regular rhythm.   Abdomen:  soft and non-tender.     Impression & Recommendations:  Problem # 1:  PSA, INCREASED (ICD-790.93) esignificant psa drop with abx therfor will moniter  as scheduled in august  Problem # 2:  ACNE ROSACEA (ICD-695.3) monitering  Problem # 3:  CHRONIC KIDNEY DISEASE STAGE II (MILD) (ICD-585.2)  stabe creatinine  Labs Reviewed: BUN: 27 (04/11/2010)   Cr: 1.9 (04/11/2010)    Hgb: 12.3 (04/11/2010)   Hct: 36.3 (04/11/2010)   Ca++: 9.6 (04/11/2010)    TP: 6.6 (04/11/2010)   Alb: 4.3 (04/11/2010)  Problem # 4:  DISEASE, HTN CKD, NOS, W/CKD, STG I-IV/UNSPC (ICD-403.90)  His updated medication list for this problem includes:    Benicar 40 Mg Tabs (Olmesartan medoxomil) ..... One by mouth daily  BP today: 120/70 Prior BP: 110/64 (04/16/2010)  10 Yr Risk Heart Disease: 7 % Prior 10 Yr Risk Heart Disease: 6 % (12/12/2009)  Labs Reviewed: K+: 5.0 (04/11/2010) Creat: : 1.9 (04/11/2010)   Chol: 173 (04/11/2010)   HDL: 41.50 (04/11/2010)   LDL: 86 (04/03/2009)   TG: 224.0 (04/11/2010)  Complete Medication List: 1)  Tricor 145 Mg  Tabs (Fenofibrate) .... Once daily 2)  Adult Aspirin Low Strength 81 Mg Tbdp (Aspirin) .... Once daily 3)  Benicar 40 Mg Tabs (Olmesartan medoxomil) .... One by mouth daily 4)  Folic Acid 1 Mg Tabs (Folic acid) .... Once daily 5)  Vitamin B-6 Cr 200 Mg Tbcr (Pyridoxine hcl) .... Once daily 6)  Therapeutic Multivitamin Tabs (Multiple vitamin) .... Once daily 7)  Lipitor 40 Mg Tabs (Atorvastatin calcium) .... One by mouth daily 8)  Clindamycin Phos-benzoyl Perox 1-5 % Gel (Clindamycin phos-benzoyl perox) .... Apply to face as needed 9)  Colcrys 0.6 Mg Tabs (Colchicine) .... One by mouth  two times a day prn  Other Orders: Admin 1st Vaccine (16109) Flu Vaccine 49yrs + (60454)  Hypertension Assessment/Plan:      The patient's hypertensive risk group is category C: Target organ damage and/or diabetes.  His calculated 10 year risk of coronary heart disease is 7 %.  Today's blood pressure is 120/70.  His blood pressure goal is < 140/90.  Patient Instructions: 1)  CPX in august

## 2010-09-25 NOTE — Assessment & Plan Note (Signed)
Summary: 3 month rov/njr   Vital Signs:  Patient profile:   65 year old male Height:      68 inches Weight:      183 pounds BMI:     27.93 Temp:     98.2 degrees F oral Pulse rate:   72 / minute Resp:     14 per minute BP sitting:   110 / 70  (left arm)  Vitals Entered By: Willy Eddy, LPN (December 12, 2009 10:36 AM) CC: roa labs-takes colchicine about once a month for flare-?maintenance med?, Lipid Management, Hypertension Management   CC:  roa labs-takes colchicine about once a month for flare-?maintenance med?, Lipid Management, and Hypertension Management.  History of Present Illness: the pt has gout and has colchicine and has used this "up" and wonderers about colycris followed for stability of HTN and renal functions using the benicar and the benicar card use...  Hypertension History:      Positive major cardiovascular risk factors include male age 60 years old or older, hyperlipidemia, and hypertension.  Negative major cardiovascular risk factors include no history of diabetes and non-tobacco-user status.        Positive history for target organ damage include renal insufficiency.  Further assessment for target organ damage reveals no history of ASHD, stroke/TIA, or peripheral vascular disease.    Lipid Management History:      Positive NCEP/ATP III risk factors include male age 63 years old or older and hypertension.  Negative NCEP/ATP III risk factors include non-diabetic, non-tobacco-user status, no ASHD (atherosclerotic heart disease), no prior stroke/TIA, no peripheral vascular disease, and no history of aortic aneurysm.      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 Once Daily 2)  Adult Aspirin Low Strength 81 Mg  Tbdp (Aspirin) .... Once Daily 3)  Benicar 40 Mg Tabs (Olmesartan Medoxomil) .... One By Mouth Daily 4)  Folic Acid 1 Mg  Tabs (Folic Acid) .... Once Daily 5)  Vitamin B-6 Cr 200 Mg  Tbcr (Pyridoxine Hcl) .... Once Daily 6)  Therapeutic Multivitamin   Tabs (Multiple Vitamin) .... Once Daily 7)  Lipitor 40 Mg  Tabs (Atorvastatin Calcium) .... One By Mouth Daily 8)  Clindamycin Phos-Benzoyl Perox 1-5 % Gel (Clindamycin Phos-Benzoyl Perox) .... Apply To Face As Needed 9)  Colchicine 0.6 Mg Tabs (Colchicine) .... One By Mouth Two Times A Day  For Acute Gour 10)  Zithromax Z-Pak 250 Mg Tabs (Azithromycin) .... As Directed.  Current Medications (verified): 1)  Tricor 145 Mg  Tabs (Fenofibrate) .... Once Daily 2)  Adult Aspirin Low Strength 81 Mg  Tbdp (Aspirin) .... Once Daily 3)  Benicar 40 Mg Tabs (Olmesartan Medoxomil) .... One By Mouth Daily 4)  Folic Acid 1  Mg  Tabs (Folic Acid) .... Once Daily 5)  Vitamin B-6 Cr 200 Mg  Tbcr (Pyridoxine Hcl) .... Once Daily 6)  Therapeutic Multivitamin   Tabs (Multiple Vitamin) .... Once Daily 7)  Lipitor 40 Mg  Tabs (Atorvastatin Calcium) .... One By Mouth Daily 8)  Clindamycin Phos-Benzoyl Perox 1-5 % Gel (Clindamycin Phos-Benzoyl Perox) .... Apply To Face As Needed 9)  Colcrys 0.6 Mg Tabs (Colchicine) .... One By Mouth  Two Times A Day Prn  Allergies (verified): No Known Drug Allergies  Past History:  Family History: Last updated:  05/24/2007 Family History of Cardiovascular disorder  Social History: Last updated: 05/24/2007 Occupation: Married Never Smoked Alcohol use-yes  Risk Factors: Smoking Status: never (12/12/2009)  Past medical, surgical, family and social histories (including risk factors) reviewed, and no changes noted (except as noted below).  Past Medical History: Reviewed history from 05/31/2007 and no changes required. Rosacea Depression Hyperlipidemia Hypertension  Past Surgical History: Reviewed history from 05/24/2007 and no changes required. Flexible Sigmoidoscopy Wisdom Teeth  Family History: Reviewed history from 05/24/2007 and no changes required. Family History of Cardiovascular disorder  Social History: Reviewed history from 05/24/2007 and no changes required. Occupation: Married Never Smoked Alcohol use-yes  Review of Systems  The patient denies anorexia, fever, weight loss, weight gain, vision loss, decreased hearing, hoarseness, chest pain, syncope, dyspnea on exertion, peripheral edema, prolonged cough, headaches, hemoptysis, abdominal pain, melena, hematochezia, severe indigestion/heartburn, hematuria, incontinence, genital sores, muscle weakness, suspicious skin lesions, transient blindness, difficulty walking, depression, unusual weight change, abnormal bleeding, enlarged lymph nodes, angioedema, and breast masses.    Physical Exam  General:  Well-developed,well-nourished,in no acute distress; alert,appropriate and cooperative throughout examination Head:  normocephalic and male-pattern balding.   Eyes:  pupils equal and pupils round.   Ears:  R ear normal and L ear normal.   Mouth:  Oral mucosa and oropharynx without lesions or exudates.  Teeth in good repair. Neck:  No deformities, masses, or tenderness noted. Lungs:  normal respiratory effort and no wheezes.   Heart:  normal rate and regular rhythm.   Abdomen:  soft and non-tender.   Msk:  normal ROM, no  joint tenderness, no joint swelling, no joint warmth, and no redness over joints.   Pulses:  R and L carotid,radial,femoral,dorsalis pedis and posterior tibial pulses are full and equal bilaterally Extremities:  trace left pedal edema.     Impression & Recommendations:  Problem # 1:  CHRONIC KIDNEY DISEASE STAGE II (MILD) (ICD-585.2) stable /imporved Labs Reviewed: BUN: 23 (12/05/2009)   Cr: 1.8 (12/05/2009)    Hgb: 12.9 (04/03/2009)   Hct: 37.4 (04/03/2009)   Ca++: 9.2 (12/05/2009)    TP: 7.2 (04/03/2009)   Alb: 4.4 (04/03/2009)  Problem # 2:  DISEASE, HTN CKD, NOS, W/CKD, STG I-IV/UNSPC (ICD-403.90) one 1/2 table a day His updated medication list for this problem includes:    Benicar 40 Mg Tabs (Olmesartan medoxomil) ..... One by mouth daily  BP today: 110/70 Prior BP: 120/70 (09/12/2009)  Prior 10 Yr Risk Heart Disease: 7 % (07/11/2008)  Labs Reviewed: K+: 4.8 (12/05/2009) Creat: : 1.8 (12/05/2009)   Chol: 181 (09/12/2009)   HDL: 45.40 (09/12/2009)   LDL: 86 (04/03/2009)   TG: 174.0 (04/03/2009)  Problem # 3:  HYPERLIPIDEMIA (ICD-272.4)  His updated medication list for this problem includes:    Tricor 145 Mg Tabs (Fenofibrate) ..... Once daily    Lipitor 40 Mg Tabs (Atorvastatin calcium) ..... One by mouth daily  Labs Reviewed: SGOT: 25 (  04/03/2009)   SGPT: 21 (04/03/2009)  Lipid Goals: Chol Goal: 200 (05/31/2007)   HDL Goal: 40 (05/31/2007)   LDL Goal: 130 (05/31/2007)   TG Goal: 150 (05/31/2007)  Prior 10 Yr Risk Heart Disease: 7 % (07/11/2008)   HDL:45.40 (09/12/2009), 41.10 (04/03/2009)  LDL:86 (04/03/2009), 85 (07/04/2008)  Chol:181 (09/12/2009), 162 (04/03/2009)  Trig:174.0 (04/03/2009), 213.0 (11/09/2008)  Problem # 4:  DEPRESSION (ICD-311)  Discussed treatment options, including trial of antidpressant medication. Will refer to behavioral health. Follow-up call in in 24-48 hours and recheck in 2 weeks, sooner as needed. Patient agrees to call if any worsening of  symptoms or thoughts of doing harm arise. Verified that the patient has no suicidal ideation at this time.   Complete Medication List: 1)  Tricor 145 Mg Tabs (Fenofibrate) .... Once daily 2)  Adult Aspirin Low Strength 81 Mg Tbdp (Aspirin) .... Once daily 3)  Benicar 40 Mg Tabs (Olmesartan medoxomil) .... One by mouth daily 4)  Folic Acid 1 Mg Tabs (Folic acid) .... Once daily 5)  Vitamin B-6 Cr 200 Mg Tbcr (Pyridoxine hcl) .... Once daily 6)  Therapeutic Multivitamin Tabs (Multiple vitamin) .... Once daily 7)  Lipitor 40 Mg Tabs (Atorvastatin calcium) .... One by mouth daily 8)  Clindamycin Phos-benzoyl Perox 1-5 % Gel (Clindamycin phos-benzoyl perox) .... Apply to face as needed 9)  Colcrys 0.6 Mg Tabs (Colchicine) .... One by mouth  two times a day prn  Hypertension Assessment/Plan:      The patient's hypertensive risk group is category C: Target organ damage and/or diabetes.  His calculated 10 year risk of coronary heart disease is 6 %.  Today's blood pressure is 110/70.  His blood pressure goal is < 140/90.  Lipid Assessment/Plan:      Based on NCEP/ATP III, the patient's risk factor category is "2 or more risk factors and a calculated 10 year CAD risk of < 20%".  The patient's lipid goals are as follows: Total cholesterol goal is 200; LDL cholesterol goal is 130; HDL cholesterol goal is 40; Triglyceride goal is 150.  His LDL cholesterol goal has been met.    Patient Instructions: 1)  August CPX

## 2010-11-29 ENCOUNTER — Other Ambulatory Visit: Payer: Self-pay | Admitting: Internal Medicine

## 2011-01-25 ENCOUNTER — Other Ambulatory Visit: Payer: Self-pay | Admitting: Internal Medicine

## 2011-04-15 ENCOUNTER — Other Ambulatory Visit (INDEPENDENT_AMBULATORY_CARE_PROVIDER_SITE_OTHER): Payer: BC Managed Care – PPO

## 2011-04-15 DIAGNOSIS — Z0000 Encounter for general adult medical examination without abnormal findings: Secondary | ICD-10-CM

## 2011-04-15 LAB — CBC WITH DIFFERENTIAL/PLATELET
Basophils Relative: 0.4 % (ref 0.0–3.0)
Eosinophils Relative: 3.7 % (ref 0.0–5.0)
Hemoglobin: 12 g/dL — ABNORMAL LOW (ref 13.0–17.0)
Lymphocytes Relative: 26.9 % (ref 12.0–46.0)
Monocytes Relative: 8.8 % (ref 3.0–12.0)
Neutro Abs: 3.7 10*3/uL (ref 1.4–7.7)
Neutrophils Relative %: 60.2 % (ref 43.0–77.0)
RBC: 3.98 Mil/uL — ABNORMAL LOW (ref 4.22–5.81)
WBC: 6.2 10*3/uL (ref 4.5–10.5)

## 2011-04-15 LAB — POCT URINALYSIS DIPSTICK
Glucose, UA: NEGATIVE
Ketones, UA: NEGATIVE
Leukocytes, UA: NEGATIVE
Spec Grav, UA: 1.02
Urobilinogen, UA: 0.2

## 2011-04-15 LAB — HEPATIC FUNCTION PANEL
AST: 26 U/L (ref 0–37)
Albumin: 4.4 g/dL (ref 3.5–5.2)
Alkaline Phosphatase: 24 U/L — ABNORMAL LOW (ref 39–117)
Bilirubin, Direct: 0.1 mg/dL (ref 0.0–0.3)
Total Protein: 6.6 g/dL (ref 6.0–8.3)

## 2011-04-15 LAB — TSH: TSH: 2.74 u[IU]/mL (ref 0.35–5.50)

## 2011-04-15 LAB — BASIC METABOLIC PANEL
CO2: 26 mEq/L (ref 19–32)
Calcium: 9.4 mg/dL (ref 8.4–10.5)
GFR: 32.39 mL/min — ABNORMAL LOW (ref >60.00)
Sodium: 141 mEq/L (ref 135–145)

## 2011-04-15 LAB — PSA: PSA: 1.65 ng/mL (ref 0.10–4.00)

## 2011-04-22 ENCOUNTER — Encounter: Payer: Self-pay | Admitting: Internal Medicine

## 2011-04-22 ENCOUNTER — Ambulatory Visit (INDEPENDENT_AMBULATORY_CARE_PROVIDER_SITE_OTHER): Payer: BC Managed Care – PPO | Admitting: Internal Medicine

## 2011-04-22 DIAGNOSIS — Z0000 Encounter for general adult medical examination without abnormal findings: Secondary | ICD-10-CM

## 2011-04-22 DIAGNOSIS — N182 Chronic kidney disease, stage 2 (mild): Secondary | ICD-10-CM

## 2011-04-22 DIAGNOSIS — N289 Disorder of kidney and ureter, unspecified: Secondary | ICD-10-CM

## 2011-04-22 DIAGNOSIS — E785 Hyperlipidemia, unspecified: Secondary | ICD-10-CM

## 2011-04-22 DIAGNOSIS — I10 Essential (primary) hypertension: Secondary | ICD-10-CM

## 2011-04-22 DIAGNOSIS — I129 Hypertensive chronic kidney disease with stage 1 through stage 4 chronic kidney disease, or unspecified chronic kidney disease: Secondary | ICD-10-CM

## 2011-04-22 DIAGNOSIS — R972 Elevated prostate specific antigen [PSA]: Secondary | ICD-10-CM

## 2011-04-22 LAB — BASIC METABOLIC PANEL
CO2: 27 mEq/L (ref 19–32)
Calcium: 9.6 mg/dL (ref 8.4–10.5)
Creatinine, Ser: 2.3 mg/dL — ABNORMAL HIGH (ref 0.4–1.5)
GFR: 30.01 mL/min — ABNORMAL LOW (ref >60.00)
Glucose, Bld: 117 mg/dL — ABNORMAL HIGH (ref 70–99)
Sodium: 143 mEq/L (ref 135–145)

## 2011-04-22 MED ORDER — OLMESARTAN MEDOXOMIL 20 MG PO TABS: 10.0000 mg | ORAL_TABLET | Freq: Every day | ORAL | Status: DC

## 2011-04-22 NOTE — Progress Notes (Signed)
Subjective:    Patient ID: Robert Gaines, male    DOB: 1945-09-21, 65 y.o.   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  He has also been exercising regularly has lost weight his blood pressures and much under control he's cut his Benicar dose from 40-20 discuss continuing to titrate that down to 10 and monitoring his blood pressure today.  He is extremely volume sensitive on both his creatinine and his BUN indicating stage II renal insufficiency renal disease  Review of Systems  Constitutional: Negative for fever and fatigue.  HENT: Negative for hearing loss, congestion, neck pain and postnasal drip.   Eyes: Negative for discharge, redness and visual disturbance.  Respiratory: Negative for cough, shortness of breath and wheezing.   Cardiovascular: Negative for leg swelling.  Gastrointestinal: Negative for abdominal pain, constipation and abdominal distention.  Genitourinary: Negative for urgency and frequency.  Musculoskeletal: Negative for joint swelling and arthralgias.  Skin: Negative for color change and rash.  Neurological: Negative for weakness and light-headedness.  Hematological: Negative for adenopathy.  Psychiatric/Behavioral: Negative for behavioral problems.   Past Medical History  Diagnosis Date  . Rosacea   . Depression   . Hyperlipidemia   . Hypertension    Past Surgical History  Procedure Date  . Flexible sigmoidoscopy   . Wisdom tooth extraction     reports that he has never smoked. He does not have any smokeless tobacco history on file. He reports that he drinks alcohol. He reports that  he does not use illicit drugs. family history includes Heart disease in an unspecified family member. Not on File     Objective:   Physical Exam  Constitutional: He is oriented to person, place, and time. He appears well-developed and well-nourished.  HENT:  Head: Normocephalic and atraumatic.  Eyes: Conjunctivae are normal. Pupils are equal, round, and reactive to light.  Neck: Normal range of motion. Neck supple.  Cardiovascular: Normal rate and regular rhythm.   Pulmonary/Chest: Effort normal and breath sounds normal.  Abdominal: Soft. Bowel sounds are normal.  Genitourinary: Prostate normal. Guaiac negative stool. No penile tenderness.  Musculoskeletal: Normal range of motion.  Neurological: He is alert and oriented to person, place, and time.  Skin: Skin is warm and dry.  Psychiatric: He has a normal mood and affect. His behavior is normal.          Assessment & Plan:   Patient presents for yearly preventative medicine examination.   all immunizations and health maintenance protocols were reviewed with the patient and they are up to date with these protocols.   screening laboratory values were reviewed with the patient including screening of hyperlipidemia PSA renal function and hepatic function.   There medications past medical history social history problem list and allergies were reviewed in detail.   Goals were established with regard to weight loss exercise diet in compliance with medications  Will monitor a basic metabolic panel today and a nonfasting state see if hydrated state his BUN declined and his creatinine is at his baseline of 2.0.  We will also titrate the Benicar from 20-10 and monitor his blood pressure. He has no peripheral  edema or indication of nephropathy  He has had no recent gout his lipids are stable.

## 2011-04-23 ENCOUNTER — Encounter: Payer: Self-pay | Admitting: Internal Medicine

## 2011-06-05 ENCOUNTER — Ambulatory Visit (INDEPENDENT_AMBULATORY_CARE_PROVIDER_SITE_OTHER): Payer: BC Managed Care – PPO | Admitting: Internal Medicine

## 2011-06-05 DIAGNOSIS — Z23 Encounter for immunization: Secondary | ICD-10-CM

## 2011-07-24 ENCOUNTER — Ambulatory Visit (INDEPENDENT_AMBULATORY_CARE_PROVIDER_SITE_OTHER): Payer: BC Managed Care – PPO | Admitting: Internal Medicine

## 2011-07-24 ENCOUNTER — Encounter: Payer: Self-pay | Admitting: Internal Medicine

## 2011-07-24 VITALS — BP 132/72 | HR 72 | Temp 98.2°F | Resp 16 | Ht 67.0 in | Wt 176.0 lb

## 2011-07-24 DIAGNOSIS — N289 Disorder of kidney and ureter, unspecified: Secondary | ICD-10-CM

## 2011-07-24 LAB — BASIC METABOLIC PANEL
BUN: 32 mg/dL — ABNORMAL HIGH (ref 6–23)
CO2: 29 mEq/L (ref 19–32)
Chloride: 107 mEq/L (ref 96–112)
Creatinine, Ser: 2 mg/dL — ABNORMAL HIGH (ref 0.4–1.5)
Glucose, Bld: 90 mg/dL (ref 70–99)
Potassium: 4.8 mEq/L (ref 3.5–5.1)

## 2011-07-24 LAB — HEMOGLOBIN A1C: Hgb A1c MFr Bld: 6.1 % (ref 4.6–6.5)

## 2011-07-24 NOTE — Patient Instructions (Signed)
The patient is instructed to continue all medications as prescribed. Schedule followup with check out clerk upon leaving the clinic  

## 2011-07-25 ENCOUNTER — Other Ambulatory Visit: Payer: Self-pay | Admitting: Internal Medicine

## 2011-07-27 NOTE — Progress Notes (Signed)
Subjective:    Patient ID: Robert Gaines, male    DOB: July 18, 1946, 65 y.o.   MRN: [id number]  HPI 10. For hypertension and hyperlipidemia with chronic renal insufficiency with monitoring of his creatinine.  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  He denies any increased edema or symptoms of renal failure his blood pressures been well controlled his weight has been stable   Review of Systems  Constitutional: Negative for fever and fatigue.  HENT: Negative for hearing loss, congestion, neck pain and postnasal drip.   Eyes: Negative for discharge, redness and visual disturbance.  Respiratory: Negative for cough, shortness of breath and wheezing.   Cardiovascular: Negative for leg swelling.  Gastrointestinal: Negative for abdominal pain, constipation and abdominal distention.  Genitourinary: Negative for urgency and frequency.  Musculoskeletal: Negative for joint swelling and arthralgias.  Skin: Negative for color change and rash.  Neurological: Negative for weakness and light-headedness.  Hematological: Negative for adenopathy.  Psychiatric/Behavioral: Negative for behavioral problems.   Past Medical History  Diagnosis Date  . Rosacea   . Depression   . Hyperlipidemia   . Hypertension     History   Social History  . Marital Status: Married    Spouse Name: N/A    Number of Children: N/A  . Years of Education: N/A   Occupational History  . Not on file.   Social History Main Topics  . Smoking status: Never Smoker   . Smokeless tobacco: Not on file  . Alcohol Use: Yes  . Drug Use: No  . Sexually Active: Not on file   Other Topics Concern  . Not on file   Social History Narrative  . No narrative on file    Past Surgical History  Procedure Date  . Flexible sigmoidoscopy   . Wisdom tooth extraction     Family History  Problem Relation Age of Onset  . Heart disease      No Known  Allergies  Current Outpatient Prescriptions on File Prior to Visit  Medication Sig Dispense Refill  . aspirin 81 MG tablet Take 81 mg by mouth daily.        Marland Kitchen atorvastatin (LIPITOR) 40 MG tablet TAKE 1 TABLET BY MOUTH EVERY DAY  30 tablet  8  . B Complex-C-Folic Acid (MULTIVITAMIN, STRESS FORMULA) tablet Take 1 tablet by mouth daily.        . clindamycin-benzoyl peroxide (BENZACLIN) gel Apply topically as needed.        . colchicine 0.6 MG tablet Take 0.6 mg by mouth 2 (two) times daily as needed.        . folic acid (FOLVITE) 1 MG tablet Take 1 mg by mouth daily.        Marland Kitchen loratadine-pseudoephedrine (CLARITIN-D 24-HOUR) 10-240 MG per 24 hr tablet Take 1 tablet by mouth daily.        Marland Kitchen olmesartan (BENICAR) 20 MG tablet Take 0.5 tablets (10 mg total) by mouth daily.      Marland Kitchen pyridoxine (B-6) 200 MG tablet Take 200 mg by mouth daily.        . TRICOR 145 MG tablet TAKE 1 TABLET BY MOUTH EVERY DAY  30 tablet  7    BP 132/72  Pulse 72  Temp 98.2 F (36.8 C)  Resp 16  Ht 5\' 7"  (1.702 m)  Wt 176 lb (79.833 kg)  BMI 27.57 kg/m2       Objective:  Physical Exam  Nursing note and vitals reviewed. Constitutional: He appears well-developed and well-nourished.  HENT:  Head: Normocephalic and atraumatic.  Eyes: Conjunctivae are normal. Pupils are equal, round, and reactive to light.  Neck: Normal range of motion. Neck supple.  Cardiovascular: Normal rate and regular rhythm.   Pulmonary/Chest: Effort normal and breath sounds normal.  Abdominal: Soft. Bowel sounds are normal.          Assessment & Plan:  Monitoring of basic metabolic panel for chronic renal insufficiency stage II chronic renal disease.  Stable hypertension her current medications discuss changing medications if renal disease progresses avoiding ACE or ARB-type drugs if renal insufficiency has worsened.  Consider renal consult.  Discussed this with the patient

## 2011-07-29 ENCOUNTER — Ambulatory Visit: Payer: BC Managed Care – PPO | Admitting: Internal Medicine

## 2011-10-21 ENCOUNTER — Other Ambulatory Visit: Payer: Self-pay | Admitting: Internal Medicine

## 2011-11-20 ENCOUNTER — Ambulatory Visit: Payer: BC Managed Care – PPO | Admitting: Internal Medicine

## 2011-11-25 ENCOUNTER — Encounter: Payer: Self-pay | Admitting: Internal Medicine

## 2011-11-25 ENCOUNTER — Ambulatory Visit (INDEPENDENT_AMBULATORY_CARE_PROVIDER_SITE_OTHER): Payer: BC Managed Care – PPO | Admitting: Internal Medicine

## 2011-11-25 VITALS — BP 120/72 | HR 72 | Temp 98.1°F | Resp 16 | Ht 67.0 in | Wt 178.0 lb

## 2011-11-25 DIAGNOSIS — I10 Essential (primary) hypertension: Secondary | ICD-10-CM

## 2011-11-25 DIAGNOSIS — N182 Chronic kidney disease, stage 2 (mild): Secondary | ICD-10-CM

## 2011-11-25 DIAGNOSIS — N289 Disorder of kidney and ureter, unspecified: Secondary | ICD-10-CM

## 2011-11-25 DIAGNOSIS — L719 Rosacea, unspecified: Secondary | ICD-10-CM

## 2011-11-25 LAB — BASIC METABOLIC PANEL
BUN: 32 mg/dL — ABNORMAL HIGH (ref 6–23)
CO2: 29 mEq/L (ref 19–32)
Calcium: 9.6 mg/dL (ref 8.4–10.5)
Chloride: 102 mEq/L (ref 96–112)
Creatinine, Ser: 2 mg/dL — ABNORMAL HIGH (ref 0.4–1.5)
Glucose, Bld: 125 mg/dL — ABNORMAL HIGH (ref 70–99)

## 2011-11-25 MED ORDER — CLINDAMYCIN PHOS-BENZOYL PEROX 1-5 % EX GEL: CUTANEOUS | Status: DC | PRN

## 2011-11-25 MED ORDER — OLMESARTAN MEDOXOMIL 5 MG PO TABS: 5.0000 mg | ORAL_TABLET | Freq: Every day | ORAL | Status: DC

## 2011-11-25 NOTE — Progress Notes (Signed)
Subjective:    Patient ID: DAILY CRATE, male    DOB: Aug 04, 1946, 66 y.o.   MRN: [id number]  HPI Patient is a 66 year old white male followed for hypertension chronic renal insufficiency with stage II renal disease a history of gouty arthropathy and a history of rosacea.  He presents today with acute complaint of worsening rosacea of the face asking for a prescription for a previous product to be used which was a topical cream which he did his rosacea.  He also has hypertension which appears to be well controlled his weight stable and recent basic metabolic panel showed stability of creatinine.  He is due a creatinine BUN and GFR today  Review of Systems  Constitutional: Negative for fever and fatigue.  HENT: Negative for hearing loss, congestion, neck pain and postnasal drip.   Eyes: Negative for discharge, redness and visual disturbance.  Respiratory: Negative for cough, shortness of breath and wheezing.   Cardiovascular: Negative for leg swelling.  Gastrointestinal: Negative for abdominal pain, constipation and abdominal distention.  Genitourinary: Negative for urgency and frequency.  Musculoskeletal: Negative for joint swelling and arthralgias.  Skin: Negative for color change and rash.  Neurological: Negative for weakness and light-headedness.  Hematological: Negative for adenopathy.  Psychiatric/Behavioral: Negative for behavioral problems.   Past Medical History  Diagnosis Date  . Rosacea   . Depression   . Hyperlipidemia   . Hypertension     History   Social History  . Marital Status: Married    Spouse Name: N/A    Number of Children: N/A  . Years of Education: N/A   Occupational History  . Not on file.   Social History Main Topics  . Smoking status: Never Smoker   . Smokeless tobacco: Not on file  . Alcohol Use: Yes  . Drug Use: No  . Sexually Active: Not on file   Other Topics Concern  . Not on file   Social History Narrative  . No narrative on  file    Past Surgical History  Procedure Date  . Flexible sigmoidoscopy   . Wisdom tooth extraction     Family History  Problem Relation Age of Onset  . Heart disease      No Known Allergies  Current Outpatient Prescriptions on File Prior to Visit  Medication Sig Dispense Refill  . aspirin 81 MG tablet Take 81 mg by mouth daily.        Marland Kitchen atorvastatin (LIPITOR) 40 MG tablet TAKE 1 TABLET BY MOUTH EVERY DAY  30 tablet  8  . B Complex-C-Folic Acid (MULTIVITAMIN, STRESS FORMULA) tablet Take 1 tablet by mouth daily.        . colchicine 0.6 MG tablet Take 0.6 mg by mouth 2 (two) times daily as needed.        . folic acid (FOLVITE) 1 MG tablet Take 1 mg by mouth daily.        Marland Kitchen loratadine-pseudoephedrine (CLARITIN-D 24-HOUR) 10-240 MG per 24 hr tablet Take 1 tablet by mouth daily.        . Potassium Gluconate 595 MG CAPS Take 1 capsule by mouth daily.      Marland Kitchen pyridoxine (B-6) 200 MG tablet Take 200 mg by mouth daily.        . TRICOR 145 MG tablet TAKE 1 TABLET BY MOUTH EVERY DAY  30 tablet  7  . DISCONTD: clindamycin-benzoyl peroxide (BENZACLIN) gel Apply topically as needed.        Marland Kitchen DISCONTD: olmesartan (BENICAR) 20  MG tablet Take 0.5 tablets (10 mg total) by mouth daily.        BP 120/72  Pulse 72  Temp 98.1 F (36.7 C)  Resp 16  Ht 5\' 7"  (1.702 m)  Wt 178 lb (80.74 kg)  BMI 27.88 kg/m2       Objective:   Physical Exam  Nursing note and vitals reviewed. Constitutional: He appears well-developed and well-nourished.  HENT:  Head: Normocephalic and atraumatic.  Eyes: Conjunctivae are normal. Pupils are equal, round, and reactive to light.  Neck: Normal range of motion. Neck supple.  Cardiovascular: Normal rate and regular rhythm.   Pulmonary/Chest: Effort normal and breath sounds normal.  Abdominal: Soft. Bowel sounds are normal.          Assessment & Plan:   we will monitor basic metabolic panel to look at his creatinine and GFR we will reduce his Benicar from  10-5 due to stability of his blood pressure we will keep him on a low-dose of an ARB for renal protection.  He has had no recent gouty flares and we are avoiding nonsteroidal use and help other than his baby aspirin that he takes daily we have talked about hydration including drinking water or G-2 prior to workouts to stay hydrated and he is following a moderate protein diet

## 2011-11-25 NOTE — Patient Instructions (Signed)
The patient is instructed to continue all medications as prescribed. Schedule followup with check out clerk upon leaving the clinic  

## 2011-12-03 ENCOUNTER — Telehealth: Payer: Self-pay | Admitting: *Deleted

## 2011-12-03 NOTE — Telephone Encounter (Signed)
Pt. Needs Prior Authorization for Benicar per wife, please.

## 2011-12-08 NOTE — Telephone Encounter (Signed)
Robert Gaines, please call her back and let her know this is already done and has been approved.Thanks.

## 2011-12-08 NOTE — Telephone Encounter (Signed)
Pt. Notified.

## 2012-02-03 ENCOUNTER — Other Ambulatory Visit: Payer: Self-pay | Admitting: Internal Medicine

## 2012-03-21 ENCOUNTER — Other Ambulatory Visit: Payer: Self-pay | Admitting: Internal Medicine

## 2012-04-29 ENCOUNTER — Other Ambulatory Visit (INDEPENDENT_AMBULATORY_CARE_PROVIDER_SITE_OTHER): Payer: BC Managed Care – PPO

## 2012-04-29 DIAGNOSIS — Z0000 Encounter for general adult medical examination without abnormal findings: Secondary | ICD-10-CM

## 2012-04-29 LAB — HEPATIC FUNCTION PANEL
ALT: 23 U/L (ref 0–53)
AST: 30 U/L (ref 0–37)
Albumin: 4.1 g/dL (ref 3.5–5.2)

## 2012-04-29 LAB — POCT URINALYSIS DIPSTICK
Bilirubin, UA: NEGATIVE
Glucose, UA: NEGATIVE
Leukocytes, UA: NEGATIVE
Nitrite, UA: NEGATIVE
Urobilinogen, UA: 0.2
pH, UA: 5.5

## 2012-04-29 LAB — BASIC METABOLIC PANEL
BUN: 27 mg/dL — ABNORMAL HIGH (ref 6–23)
Creatinine, Ser: 1.8 mg/dL — ABNORMAL HIGH (ref 0.4–1.5)
GFR: 40.23 mL/min — ABNORMAL LOW (ref >60.00)
Glucose, Bld: 93 mg/dL (ref 70–99)
Potassium: 5 mEq/L (ref 3.5–5.1)

## 2012-04-29 LAB — CBC WITH DIFFERENTIAL/PLATELET
Basophils Relative: 0.5 % (ref 0.0–3.0)
Eosinophils Relative: 2.8 % (ref 0.0–5.0)
HCT: 40.2 % (ref 39.0–52.0)
Hemoglobin: 13.3 g/dL (ref 13.0–17.0)
Lymphs Abs: 1.5 10*3/uL (ref 0.7–4.0)
Monocytes Relative: 8.5 % (ref 3.0–12.0)
Neutro Abs: 3.5 10*3/uL (ref 1.4–7.7)
RBC: 4.51 Mil/uL (ref 4.22–5.81)
WBC: 5.7 10*3/uL (ref 4.5–10.5)

## 2012-04-29 LAB — LIPID PANEL
LDL Cholesterol: 76 mg/dL (ref 0–99)
Total CHOL/HDL Ratio: 4

## 2012-04-29 LAB — TSH: TSH: 1.92 u[IU]/mL (ref 0.35–5.50)

## 2012-05-06 ENCOUNTER — Encounter: Payer: Self-pay | Admitting: Internal Medicine

## 2012-05-06 ENCOUNTER — Ambulatory Visit (INDEPENDENT_AMBULATORY_CARE_PROVIDER_SITE_OTHER): Payer: BC Managed Care – PPO | Admitting: Internal Medicine

## 2012-05-06 VITALS — BP 120/76 | HR 72 | Temp 98.5°F | Resp 16 | Ht 67.0 in | Wt 172.0 lb

## 2012-05-06 DIAGNOSIS — T887XXA Unspecified adverse effect of drug or medicament, initial encounter: Secondary | ICD-10-CM

## 2012-05-06 DIAGNOSIS — Z0000 Encounter for general adult medical examination without abnormal findings: Secondary | ICD-10-CM

## 2012-05-06 DIAGNOSIS — Z23 Encounter for immunization: Secondary | ICD-10-CM

## 2012-05-06 DIAGNOSIS — E785 Hyperlipidemia, unspecified: Secondary | ICD-10-CM

## 2012-05-06 NOTE — Progress Notes (Signed)
Subjective:    Patient ID: Robert Gaines, male    DOB: 03/03/1946, 66 y.o.   MRN: [id number]  HPI Patient is a 66 year old male who is followed for hypertension stage II kidney disease mild hyperlipidemia history of elevated PSA who presents for his yearly examination we reviewed with the patient and they're screening blood work showing improvement of the creatinine to 1.8 which is baseline from 2 years ago stabilization glomerular filtration resolution of anemia stable liver function stable thyroid moderate increase in PSA and the best cholesterol profile that he has had including a triglyceride 137   Review of Systems  Constitutional: Negative for fever and fatigue.  HENT: Negative for hearing loss, congestion, neck pain and postnasal drip.   Eyes: Negative for discharge, redness and visual disturbance.  Respiratory: Negative for cough, shortness of breath and wheezing.   Cardiovascular: Negative for leg swelling.  Gastrointestinal: Negative for abdominal pain, constipation and abdominal distention.  Genitourinary: Negative for urgency and frequency.  Musculoskeletal: Negative for joint swelling and arthralgias.  Skin: Negative for color change and rash.  Neurological: Negative for weakness and light-headedness.  Hematological: Negative for adenopathy.  Psychiatric/Behavioral: Negative for behavioral problems.       Objective:   Physical Exam  Nursing note and vitals reviewed. Constitutional: He is oriented to person, place, and time. He appears well-developed and well-nourished.  HENT:  Head: Normocephalic and atraumatic.  Eyes: Conjunctivae normal are normal. Pupils are equal, round, and reactive to light.  Neck: Normal range of motion. Neck supple.  Cardiovascular: Normal rate and regular rhythm.   Pulmonary/Chest: Effort normal and breath sounds normal.  Abdominal: Soft. Bowel sounds are normal.  Genitourinary: Rectum normal and prostate normal.  Musculoskeletal: Normal  range of motion.  Neurological: He is alert and oriented to person, place, and time.  Skin: Skin is warm and dry.  Psychiatric: He has a normal mood and affect. His behavior is normal.    Past Medical History  Diagnosis Date  . Rosacea   . Depression   . Hyperlipidemia   . Hypertension     History   Social History  . Marital Status: Married    Spouse Name: N/A    Number of Children: N/A  . Years of Education: N/A   Occupational History  . Not on file.   Social History Main Topics  . Smoking status: Never Smoker   . Smokeless tobacco: Not on file  . Alcohol Use: Yes  . Drug Use: No  . Sexually Active: Not on file   Other Topics Concern  . Not on file   Social History Narrative  . No narrative on file    Past Surgical History  Procedure Date  . Flexible sigmoidoscopy   . Wisdom tooth extraction     Family History  Problem Relation Age of Onset  . Heart disease      No Known Allergies  Current Outpatient Prescriptions on File Prior to Visit  Medication Sig Dispense Refill  . aspirin 81 MG tablet Take 81 mg by mouth daily.        Marland Kitchen atorvastatin (LIPITOR) 40 MG tablet TAKE 1 TABLET BY MOUTH EVERY DAY  30 tablet  8  . B Complex-C-Folic Acid (MULTIVITAMIN, STRESS FORMULA) tablet Take 1 tablet by mouth daily.        . fenofibrate (TRICOR) 145 MG tablet TAKE 1 TABLET BY MOUTH EVERY DAY  30 tablet  7  . folic acid (FOLVITE) 1 MG tablet Take 1  mg by mouth daily.        Marland Kitchen KLS ALLERCLEAR D-24HR 10-240 MG per 24 hr tablet TAKE 1 TABLET BY MOUTH ONCE DAILY  30 tablet  11  . olmesartan (BENICAR) 5 MG tablet Take 1 tablet (5 mg total) by mouth daily.  30 tablet  11  . pyridoxine (B-6) 200 MG tablet Take 200 mg by mouth daily.          BP 120/76  Pulse 72  Temp 98.5 F (36.9 C)  Resp 16  Ht 5\' 7"  (1.702 m)  Wt 172 lb (78.019 kg)  BMI 26.94 kg/m2         Assessment & Plan:   Patient presents for yearly preventative medicine examination.   all  immunizations and health maintenance protocols were reviewed with the patient and they are up to date with these protocols.   screening laboratory values were reviewed with the patient including screening of hyperlipidemia PSA renal function and hepatic function.   There medications past medical history social history problem list and allergies were reviewed in detail.   Goals were established with regard to weight loss exercise diet in compliance with medications   Will hold the tricor and monitor the triglycerides.

## 2012-05-06 NOTE — Patient Instructions (Signed)
The patient is instructed to continue all medications as prescribed. Schedule followup with check out clerk upon leaving the clinic  

## 2012-06-17 ENCOUNTER — Telehealth: Payer: Self-pay | Admitting: Internal Medicine

## 2012-06-17 MED ORDER — AZITHROMYCIN 250 MG PO TABS: ORAL_TABLET | ORAL | Status: DC

## 2012-06-17 NOTE — Telephone Encounter (Signed)
Ok for z pack per dr Derrill Kay in and pt informed

## 2012-06-17 NOTE — Telephone Encounter (Signed)
Caller: Robert Gaines/Patient; Phone: [phone]; Reason for Call: Caller: Robert Gaines/Patient; Patient Name: Robert Gaines; PCP: Darryll Capers (Adults only); Best Callback Phone Number: [phone].  Pt.  Complains of Upper respiratory symptoms 7 days ago 06/10/12.  Pt.  Is coughing up yellow/green sputum.  Afebrile.  Pt.  Was triaged with Cough-Adult, and emergent sx.  R/o per guidelines with exception to: Productive cough with colored sputum.  Disposition: See Provider within 24 hours.  PT.  DECLINIED APPT.  HIS WIFE IS IN THE HOSPITAL AND HE DOESN'T WANT TO COME TO OFFICE AWAY FROM HER.  HE REQUESTS A Z PAK, AND STATES THAT THIS HAS BEEN DONE BEFORE FOR HIM.  Please contact pt.  On cell ph: [phone].  Care instructions given.  CAN/db.

## 2012-07-19 ENCOUNTER — Other Ambulatory Visit: Payer: Self-pay | Admitting: Internal Medicine

## 2012-07-28 ENCOUNTER — Other Ambulatory Visit: Payer: BC Managed Care – PPO

## 2012-07-29 ENCOUNTER — Other Ambulatory Visit (INDEPENDENT_AMBULATORY_CARE_PROVIDER_SITE_OTHER): Payer: Medicare Other

## 2012-07-29 DIAGNOSIS — E785 Hyperlipidemia, unspecified: Secondary | ICD-10-CM | POA: Diagnosis not present

## 2012-07-29 LAB — LIPID PANEL: HDL: 47.8 mg/dL (ref >39.00)

## 2012-08-05 ENCOUNTER — Ambulatory Visit (INDEPENDENT_AMBULATORY_CARE_PROVIDER_SITE_OTHER): Payer: Medicare Other | Admitting: Internal Medicine

## 2012-08-05 ENCOUNTER — Encounter: Payer: Self-pay | Admitting: Internal Medicine

## 2012-08-05 VITALS — BP 135/82 | HR 72 | Temp 98.0°F | Resp 16 | Ht 67.0 in | Wt 178.0 lb

## 2012-08-05 DIAGNOSIS — I129 Hypertensive chronic kidney disease with stage 1 through stage 4 chronic kidney disease, or unspecified chronic kidney disease: Secondary | ICD-10-CM | POA: Diagnosis not present

## 2012-08-05 DIAGNOSIS — I10 Essential (primary) hypertension: Secondary | ICD-10-CM

## 2012-08-05 DIAGNOSIS — N289 Disorder of kidney and ureter, unspecified: Secondary | ICD-10-CM | POA: Diagnosis not present

## 2012-08-05 DIAGNOSIS — N182 Chronic kidney disease, stage 2 (mild): Secondary | ICD-10-CM

## 2012-08-05 MED ORDER — OLMESARTAN MEDOXOMIL 5 MG PO TABS: 5.0000 mg | ORAL_TABLET | Freq: Every day | ORAL | Status: DC

## 2012-08-05 NOTE — Progress Notes (Signed)
Subjective:    Patient ID: Robert Gaines, male    DOB: 1946/02/16, 66 y.o.   MRN: [id number]  HPI  Pre visit labs for lipids reviewed with patient  Review of Systems  Constitutional: Negative for fever and fatigue.  HENT: Negative for hearing loss, congestion, neck pain and postnasal drip.   Eyes: Negative for discharge, redness and visual disturbance.  Respiratory: Negative for cough, shortness of breath and wheezing.   Cardiovascular: Negative for leg swelling.  Gastrointestinal: Negative for abdominal pain, constipation and abdominal distention.  Genitourinary: Negative for urgency and frequency.  Musculoskeletal: Negative for joint swelling and arthralgias.  Skin: Negative for color change and rash.  Neurological: Negative for weakness and light-headedness.  Hematological: Negative for adenopathy.  Psychiatric/Behavioral: Negative for behavioral problems.   Past Medical History  Diagnosis Date  . Rosacea   . Depression   . Hyperlipidemia   . Hypertension     History   Social History  . Marital Status: Married    Spouse Name: N/A    Number of Children: N/A  . Years of Education: N/A   Occupational History  . Not on file.   Social History Main Topics  . Smoking status: Never Smoker   . Smokeless tobacco: Not on file  . Alcohol Use: Yes  . Drug Use: No  . Sexually Active: Not on file   Other Topics Concern  . Not on file   Social History Narrative  . No narrative on file    Past Surgical History  Procedure Date  . Flexible sigmoidoscopy   . Wisdom tooth extraction     Family History  Problem Relation Age of Onset  . Heart disease      No Known Allergies  Current Outpatient Prescriptions on File Prior to Visit  Medication Sig Dispense Refill  . aspirin 81 MG tablet Take 81 mg by mouth daily.        Marland Kitchen atorvastatin (LIPITOR) 40 MG tablet TAKE 1 TABLET BY MOUTH EVERY DAY  30 tablet  8  . B Complex-C-Folic Acid (MULTIVITAMIN, STRESS FORMULA)  tablet Take 1 tablet by mouth daily.        . folic acid (FOLVITE) 1 MG tablet Take 1 mg by mouth daily.        Marland Kitchen KLS ALLERCLEAR D-24HR 10-240 MG per 24 hr tablet TAKE 1 TABLET BY MOUTH ONCE DAILY  30 tablet  11  . pyridoxine (B-6) 200 MG tablet Take 200 mg by mouth daily.        . [DISCONTINUED] olmesartan (BENICAR) 5 MG tablet Take 1 tablet (5 mg total) by mouth daily.  30 tablet  11    BP 135/82  Pulse 72  Temp 98 F (36.7 C)  Resp 16  Ht 5\' 7"  (1.702 m)  Wt 178 lb (80.74 kg)  BMI 27.88 kg/m2       Objective:   Physical Exam  Vitals reviewed. Constitutional: He appears well-developed and well-nourished.  HENT:  Head: Normocephalic and atraumatic.  Eyes: Conjunctivae normal are normal. Pupils are equal, round, and reactive to light.  Neck: Normal range of motion. Neck supple.  Cardiovascular: Normal rate and regular rhythm.   Murmur heard. Pulmonary/Chest: Effort normal and breath sounds normal.  Abdominal: Soft. Bowel sounds are normal.          Assessment & Plan:  Stable renal and HTN monitering of lipids with increased TG due to carbohydrates Diet reveiwed  I have spent more than 30 minutes examining  this patient face-to-face of which over half was spent in counseling

## 2012-09-28 ENCOUNTER — Telehealth: Payer: Self-pay | Admitting: Internal Medicine

## 2012-09-28 MED ORDER — AZITHROMYCIN 250 MG PO TABS: ORAL_TABLET | ORAL | Status: DC

## 2012-09-28 MED ORDER — LISINOPRIL 10 MG PO TABS: 5.0000 mg | ORAL_TABLET | Freq: Every day | ORAL | Status: DC

## 2012-09-28 NOTE — Telephone Encounter (Signed)
Change benicar to 5 mg pf lisinopril  ( take 1/2) May call in z pack

## 2012-09-28 NOTE — Telephone Encounter (Signed)
Patient came in stating that he would like to have a zpack called into [facility] in [place] because he is loosing his voice and getting a sore throat. Patient also states that his benicar is too expensive and would like for the MD to suggest something cheaper. Please advise and assist.

## 2012-09-28 NOTE — Telephone Encounter (Signed)
rx's sent in electronically, pt aware 

## 2012-12-16 DIAGNOSIS — H2510 Age-related nuclear cataract, unspecified eye: Secondary | ICD-10-CM | POA: Diagnosis not present

## 2013-02-02 ENCOUNTER — Encounter: Payer: Self-pay | Admitting: Internal Medicine

## 2013-02-02 ENCOUNTER — Ambulatory Visit (INDEPENDENT_AMBULATORY_CARE_PROVIDER_SITE_OTHER): Payer: Medicare Other | Admitting: Internal Medicine

## 2013-02-02 VITALS — BP 118/78 | HR 72 | Temp 98.2°F | Resp 16 | Ht 67.0 in | Wt 174.0 lb

## 2013-02-02 DIAGNOSIS — N182 Chronic kidney disease, stage 2 (mild): Secondary | ICD-10-CM

## 2013-02-02 DIAGNOSIS — E785 Hyperlipidemia, unspecified: Secondary | ICD-10-CM

## 2013-02-02 DIAGNOSIS — I129 Hypertensive chronic kidney disease with stage 1 through stage 4 chronic kidney disease, or unspecified chronic kidney disease: Secondary | ICD-10-CM

## 2013-02-02 DIAGNOSIS — R972 Elevated prostate specific antigen [PSA]: Secondary | ICD-10-CM

## 2013-02-02 LAB — HEPATIC FUNCTION PANEL
ALT: 23 U/L (ref 0–53)
AST: 22 U/L (ref 0–37)
Albumin: 4 g/dL (ref 3.5–5.2)
Total Bilirubin: 0.7 mg/dL (ref 0.3–1.2)
Total Protein: 6.6 g/dL (ref 6.0–8.3)

## 2013-02-02 LAB — LIPID PANEL
Cholesterol: 145 mg/dL (ref 0–200)
HDL: 43.2 mg/dL (ref >39.00)
Triglycerides: 160 mg/dL — ABNORMAL HIGH (ref 0.0–149.0)
VLDL: 32 mg/dL (ref 0.0–40.0)

## 2013-02-02 LAB — BASIC METABOLIC PANEL
Calcium: 9.2 mg/dL (ref 8.4–10.5)
GFR: 61.52 mL/min (ref >60.00)
Glucose, Bld: 93 mg/dL (ref 70–99)
Potassium: 4.7 mEq/L (ref 3.5–5.1)
Sodium: 141 mEq/L (ref 135–145)

## 2013-02-02 NOTE — Addendum Note (Signed)
Addended by: Stacie Glaze on: 02/02/2013 09:36 AM   Modules accepted: Orders

## 2013-02-02 NOTE — Patient Instructions (Signed)
The patient is instructed to continue all medications as prescribed. Schedule followup with check out clerk upon leaving the clinic  

## 2013-02-02 NOTE — Progress Notes (Signed)
Subjective:    Patient ID: Robert Gaines, male    DOB: May 01, 1946, 67 y.o.   MRN: [id number]  HPI Is a 67 year old male who presents for followup of hypertension hyperlipidemia and also for renal insufficiency.  He presents today for chronic monitoring of his chronic disease states. -- 4 a lipid panel a liver panel and a basic metabolic panel to look at kidneys    Review of Systems  Constitutional: Negative for fever and fatigue.  HENT: Negative for hearing loss, congestion, neck pain and postnasal drip.   Eyes: Negative for discharge, redness and visual disturbance.  Respiratory: Negative for cough, shortness of breath and wheezing.   Cardiovascular: Negative for leg swelling.  Gastrointestinal: Negative for abdominal pain, constipation and abdominal distention.  Genitourinary: Negative for urgency and frequency.  Musculoskeletal: Negative for joint swelling and arthralgias.  Skin: Negative for color change and rash.  Neurological: Negative for weakness and light-headedness.  Hematological: Negative for adenopathy.  Psychiatric/Behavioral: Negative for behavioral problems.       Past Medical History  Diagnosis Date  . Rosacea   . Depression   . Hyperlipidemia   . Hypertension     History   Social History  . Marital Status: Married    Spouse Name: N/A    Number of Children: N/A  . Years of Education: N/A   Occupational History  . Not on file.   Social History Main Topics  . Smoking status: Never Smoker   . Smokeless tobacco: Not on file  . Alcohol Use: Yes  . Drug Use: No  . Sexually Active: Not on file   Other Topics Concern  . Not on file   Social History Narrative  . No narrative on file    Past Surgical History  Procedure Laterality Date  . Flexible sigmoidoscopy    . Wisdom tooth extraction      Family History  Problem Relation Age of Onset  . Heart disease      No Known Allergies  Current Outpatient Prescriptions on File Prior to Visit   Medication Sig Dispense Refill  . aspirin 81 MG tablet Take 81 mg by mouth daily.        Marland Kitchen atorvastatin (LIPITOR) 40 MG tablet TAKE 1 TABLET BY MOUTH EVERY DAY  30 tablet  8  . B Complex-C-Folic Acid (MULTIVITAMIN, STRESS FORMULA) tablet Take 1 tablet by mouth daily.        . folic acid (FOLVITE) 1 MG tablet Take 1 mg by mouth daily.        Marland Kitchen KLS ALLERCLEAR D-24HR 10-240 MG per 24 hr tablet TAKE 1 TABLET BY MOUTH ONCE DAILY  30 tablet  11  . lisinopril (PRINIVIL,ZESTRIL) 10 MG tablet Take 0.5 tablets (5 mg total) by mouth daily.  45 tablet  3  . pyridoxine (B-6) 200 MG tablet Take 200 mg by mouth daily.         No current facility-administered medications on file prior to visit.    BP 118/78  Pulse 72  Temp(Src) 98.2 F (36.8 C)  Resp 16  Ht 5\' 7"  (1.702 m)  Wt 174 lb (78.926 kg)  BMI 27.25 kg/m2    Objective:   Physical Exam  Nursing note and vitals reviewed. Constitutional: He appears well-developed and well-nourished.  HENT:  Head: Normocephalic and atraumatic.  Eyes: Conjunctivae are normal. Pupils are equal, round, and reactive to light.  Neck: Normal range of motion. Neck supple.  Cardiovascular: Normal rate and regular rhythm.  Pulmonary/Chest: Effort normal and breath sounds normal.  Abdominal: Soft. Bowel sounds are normal.          Assessment & Plan:  We'll draw appropriate blood work including a lipid panel liver panel and a basic metabolic panel.  Recently his creatinine has been stable and cholesterol has been at goal

## 2013-02-09 ENCOUNTER — Other Ambulatory Visit: Payer: Self-pay | Admitting: Internal Medicine

## 2013-03-30 ENCOUNTER — Other Ambulatory Visit: Payer: Self-pay

## 2013-04-18 ENCOUNTER — Other Ambulatory Visit: Payer: Self-pay | Admitting: Internal Medicine

## 2013-06-30 ENCOUNTER — Other Ambulatory Visit: Payer: Self-pay

## 2013-08-08 ENCOUNTER — Encounter: Payer: Medicare Other | Admitting: Internal Medicine

## 2013-08-09 ENCOUNTER — Ambulatory Visit (INDEPENDENT_AMBULATORY_CARE_PROVIDER_SITE_OTHER): Payer: Medicare Other

## 2013-08-09 DIAGNOSIS — Z23 Encounter for immunization: Secondary | ICD-10-CM

## 2013-08-22 ENCOUNTER — Encounter: Payer: Medicare Other | Admitting: Internal Medicine

## 2013-09-21 ENCOUNTER — Encounter: Payer: Medicare Other | Admitting: Internal Medicine

## 2013-09-21 ENCOUNTER — Ambulatory Visit (INDEPENDENT_AMBULATORY_CARE_PROVIDER_SITE_OTHER): Payer: Medicare Other | Admitting: Internal Medicine

## 2013-09-21 ENCOUNTER — Encounter: Payer: Self-pay | Admitting: Internal Medicine

## 2013-09-21 VITALS — BP 140/80 | HR 72 | Temp 98.3°F | Resp 16 | Ht 67.0 in | Wt 175.0 lb

## 2013-09-21 DIAGNOSIS — N182 Chronic kidney disease, stage 2 (mild): Secondary | ICD-10-CM | POA: Diagnosis not present

## 2013-09-21 DIAGNOSIS — Z136 Encounter for screening for cardiovascular disorders: Secondary | ICD-10-CM | POA: Diagnosis not present

## 2013-09-21 DIAGNOSIS — R972 Elevated prostate specific antigen [PSA]: Secondary | ICD-10-CM | POA: Diagnosis not present

## 2013-09-21 DIAGNOSIS — Z125 Encounter for screening for malignant neoplasm of prostate: Secondary | ICD-10-CM | POA: Diagnosis not present

## 2013-09-21 DIAGNOSIS — E785 Hyperlipidemia, unspecified: Secondary | ICD-10-CM | POA: Diagnosis not present

## 2013-09-21 DIAGNOSIS — Z0000 Encounter for general adult medical examination without abnormal findings: Secondary | ICD-10-CM

## 2013-09-21 DIAGNOSIS — Z1211 Encounter for screening for malignant neoplasm of colon: Secondary | ICD-10-CM

## 2013-09-21 DIAGNOSIS — L719 Rosacea, unspecified: Secondary | ICD-10-CM

## 2013-09-21 LAB — PSA: PSA: 6.66 ng/mL — ABNORMAL HIGH (ref 0.10–4.00)

## 2013-09-21 LAB — LIPID PANEL
CHOLESTEROL: 172 mg/dL (ref 0–200)
HDL: 45.5 mg/dL (ref >39.00)
Total CHOL/HDL Ratio: 4
Triglycerides: 215 mg/dL — ABNORMAL HIGH (ref 0.0–149.0)
VLDL: 43 mg/dL — AB (ref 0.0–40.0)

## 2013-09-21 LAB — POCT URINALYSIS DIPSTICK
BILIRUBIN UA: NEGATIVE
Glucose, UA: NEGATIVE
Ketones, UA: NEGATIVE
LEUKOCYTES UA: NEGATIVE
Nitrite, UA: NEGATIVE
PROTEIN UA: NEGATIVE
SPEC GRAV UA: 1.02
Urobilinogen, UA: 0.2
pH, UA: 6

## 2013-09-21 LAB — CBC WITH DIFFERENTIAL/PLATELET
BASOS ABS: 0 10*3/uL (ref 0.0–0.1)
Basophils Relative: 0.4 % (ref 0.0–3.0)
EOS ABS: 0.1 10*3/uL (ref 0.0–0.7)
Eosinophils Relative: 1.7 % (ref 0.0–5.0)
HCT: 46.2 % (ref 39.0–52.0)
HEMOGLOBIN: 15.2 g/dL (ref 13.0–17.0)
LYMPHS ABS: 1.7 10*3/uL (ref 0.7–4.0)
LYMPHS PCT: 23.6 % (ref 12.0–46.0)
MCHC: 33 g/dL (ref 30.0–36.0)
MCV: 90.8 fl (ref 78.0–100.0)
MONO ABS: 0.6 10*3/uL (ref 0.1–1.0)
Monocytes Relative: 7.7 % (ref 3.0–12.0)
NEUTROS ABS: 4.8 10*3/uL (ref 1.4–7.7)
Neutrophils Relative %: 66.6 % (ref 43.0–77.0)
Platelets: 334 10*3/uL (ref 150.0–400.0)
RBC: 5.08 Mil/uL (ref 4.22–5.81)
RDW: 13.1 % (ref 11.5–14.6)
WBC: 7.2 10*3/uL (ref 4.5–10.5)

## 2013-09-21 LAB — HEPATIC FUNCTION PANEL
ALBUMIN: 4.5 g/dL (ref 3.5–5.2)
ALT: 23 U/L (ref 0–53)
AST: 28 U/L (ref 0–37)
Alkaline Phosphatase: 64 U/L (ref 39–117)
Bilirubin, Direct: 0.1 mg/dL (ref 0.0–0.3)
TOTAL PROTEIN: 7 g/dL (ref 6.0–8.3)
Total Bilirubin: 0.8 mg/dL (ref 0.3–1.2)

## 2013-09-21 LAB — TSH: TSH: 2.93 u[IU]/mL (ref 0.35–5.50)

## 2013-09-21 LAB — BASIC METABOLIC PANEL
BUN: 21 mg/dL (ref 6–23)
CALCIUM: 9.6 mg/dL (ref 8.4–10.5)
CO2: 28 mEq/L (ref 19–32)
Chloride: 103 mEq/L (ref 96–112)
Creatinine, Ser: 1.4 mg/dL (ref 0.4–1.5)
GFR: 55.71 mL/min — AB (ref >60.00)
GLUCOSE: 107 mg/dL — AB (ref 70–99)
POTASSIUM: 4.7 meq/L (ref 3.5–5.1)
Sodium: 138 mEq/L (ref 135–145)

## 2013-09-21 LAB — LDL CHOLESTEROL, DIRECT: LDL DIRECT: 99.6 mg/dL

## 2013-09-21 NOTE — Progress Notes (Signed)
Subjective:    Patient ID: Robert Gaines, male    DOB: [birth year], 68 y.o.   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  Appropriate lab results ordered Repeat blood pressure today 130.70   Review of Systems     Objective:   Physical Exam        Assessment & Plan:   Subjective:    Robert Gaines is a 68 y.o. male who presents for a welcome to Medicare exam.   Cardiac risk factors: advanced age (older than 80 for men, 17 for women), dyslipidemia and hypertension.  Depression Screen (Note: if answer to either of the following is "Yes", a more complete depression screening is indicated)  Q1: Over the past two weeks, have you felt down, depressed or hopeless? no Q2: Over the past two weeks, have you felt little interest or pleasure in doing things? no  Activities of Daily Living In your present state of health, do you have any difficulty performing the following activities?:  Preparing food and eating?: No Bathing yourself: No Getting dressed: No Using the toilet:No Moving around from place to place: No In the past year have you fallen or had a near fall?:No  Current exercise habits: Gym/ health club routine includes cardio, light weights and stationary bike.  Dietary issues discussed:  Renal diet  Hearing difficulties: No Safe in current home environment: yes  The following portions of the patient's history were reviewed and updated as appropriate: allergies, current medications, past family history, past medical history, past social history, past surgical history and problem list. Review of Systems Pertinent items are noted in HPI.    Objective:     Vision by Snellen chart: right eye:20/20, left eye:20/20 Blood pressure 140/80, pulse 72, temperature 98.3 F (36.8 C), resp. rate 16,  height 5\' 7"  (1.702 m), weight 175 lb (79.379 kg). Body mass index is 27.4 kg/(m^2). BP 140/80  Pulse 72  Temp(Src) 98.3 F (36.8 C)  Resp 16  Ht 5\' 7"  (1.702 m)  Wt 175 lb (79.379 kg)  BMI 27.40 kg/m2  General Appearance:    Alert, cooperative, no distress, appears stated age  Head:    Normocephalic, without obvious abnormality, atraumatic  Eyes:    PERRL, conjunctiva/corneas clear, EOM's intact, fundi    benign, both eyes       Ears:    Normal TM's and external ear canals, both ears  Nose:   Nares normal, septum midline, mucosa normal, no drainage    or sinus tenderness  Throat:   Lips, mucosa, and tongue normal; teeth and gums normal  Neck:   Supple, symmetrical, trachea midline, no adenopathy;       thyroid:  No enlargement/tenderness/nodules; no carotid   bruit or JVD  Back:     Symmetric, no curvature, ROM normal, no CVA tenderness  Lungs:     Clear to auscultation bilaterally, respirations unlabored  Chest wall:    No tenderness or deformity  Heart:    Regular rate and rhythm, S1 and S2 normal, no murmur, rub   or gallop  Abdomen:     Soft, non-tender, bowel sounds active all four quadrants,    no masses, no organomegaly  Genitalia:    Normal male without lesion, discharge or tenderness  Rectal:    Normal tone, normal  prostate, no masses or tenderness;   guaiac negative stool  Extremities:   Extremities normal, atraumatic, no cyanosis or edema  Pulses:   2+ and symmetric all extremities  Skin:   Skin color, texture, turgor normal, no rashes or lesions  Lymph nodes:   Cervical, supraclavicular, and axillary nodes normal  Neurologic:   CNII-XII intact. Normal strength, sensation and reflexes      throughout      Assessment:      Patient presents for yearly preventative medicine examination.   all immunizations and health maintenance protocols were reviewed with the patient and they are up to date with these protocols.   screening laboratory values were reviewed with  the patient including screening of hyperlipidemia PSA renal function and hepatic function.   There medications past medical history social history problem list and allergies were reviewed in detail.   Goals were established with regard to weight loss exercise diet in compliance with medications   has Power of [facility]   Plan:     During the course of the visit the patient was educated and counseled about appropriate screening and preventive services including:   Pneumococcal vaccine   Influenza vaccine  Prostate cancer screening  Colorectal cancer screening  Patient Instructions (the written plan) was given to the patient.

## 2013-09-21 NOTE — Progress Notes (Signed)
Pre visit review using our clinic review tool, if applicable. No additional management support is needed unless otherwise documented below in the visit note. 

## 2013-09-21 NOTE — Patient Instructions (Signed)
The patient is instructed to continue all medications as prescribed. Schedule followup with check out clerk upon leaving the clinic  

## 2013-09-21 NOTE — Addendum Note (Signed)
Addended by: Bonnye FavaKWEI, Viraaj Vorndran K on: 09/21/2013 09:38 AM   Modules accepted: Orders

## 2013-09-22 ENCOUNTER — Other Ambulatory Visit: Payer: Self-pay | Admitting: Internal Medicine

## 2013-09-27 ENCOUNTER — Encounter: Payer: Self-pay | Admitting: Cardiology

## 2013-09-27 ENCOUNTER — Ambulatory Visit (HOSPITAL_COMMUNITY): Payer: Medicare Other | Attending: Cardiology

## 2013-09-27 DIAGNOSIS — I129 Hypertensive chronic kidney disease with stage 1 through stage 4 chronic kidney disease, or unspecified chronic kidney disease: Secondary | ICD-10-CM | POA: Insufficient documentation

## 2013-09-27 DIAGNOSIS — N182 Chronic kidney disease, stage 2 (mild): Secondary | ICD-10-CM | POA: Insufficient documentation

## 2013-09-27 DIAGNOSIS — I700 Atherosclerosis of aorta: Secondary | ICD-10-CM | POA: Diagnosis not present

## 2013-09-27 DIAGNOSIS — E785 Hyperlipidemia, unspecified: Secondary | ICD-10-CM | POA: Diagnosis not present

## 2013-09-27 DIAGNOSIS — I708 Atherosclerosis of other arteries: Secondary | ICD-10-CM | POA: Insufficient documentation

## 2013-09-27 DIAGNOSIS — Z136 Encounter for screening for cardiovascular disorders: Secondary | ICD-10-CM

## 2013-09-30 ENCOUNTER — Encounter: Payer: Self-pay | Admitting: Internal Medicine

## 2013-10-21 ENCOUNTER — Other Ambulatory Visit: Payer: Self-pay | Admitting: Internal Medicine

## 2013-10-21 DIAGNOSIS — N410 Acute prostatitis: Secondary | ICD-10-CM

## 2013-10-21 DIAGNOSIS — R972 Elevated prostate specific antigen [PSA]: Secondary | ICD-10-CM

## 2013-10-21 MED ORDER — CIPROFLOXACIN HCL 500 MG PO TABS: 500.0000 mg | ORAL_TABLET | Freq: Two times a day (BID) | ORAL | Status: DC

## 2013-10-25 ENCOUNTER — Ambulatory Visit (AMBULATORY_SURGERY_CENTER): Payer: Self-pay | Admitting: *Deleted

## 2013-10-25 VITALS — Ht 67.0 in | Wt 181.6 lb

## 2013-10-25 DIAGNOSIS — Z1211 Encounter for screening for malignant neoplasm of colon: Secondary | ICD-10-CM

## 2013-10-25 MED ORDER — MOVIPREP 100 G PO SOLR: 1.0000 | Freq: Once | ORAL | Status: DC

## 2013-10-25 NOTE — Progress Notes (Signed)
Denies allergies to eggs or soy products. Denies complications with sedation or anesthesia. 

## 2013-11-02 ENCOUNTER — Ambulatory Visit (AMBULATORY_SURGERY_CENTER): Payer: Medicare Other | Admitting: Internal Medicine

## 2013-11-02 ENCOUNTER — Encounter: Payer: Self-pay | Admitting: Internal Medicine

## 2013-11-02 VITALS — BP 122/71 | HR 64 | Temp 98.5°F | Resp 21 | Ht 67.0 in | Wt 181.0 lb

## 2013-11-02 DIAGNOSIS — F329 Major depressive disorder, single episode, unspecified: Secondary | ICD-10-CM | POA: Diagnosis not present

## 2013-11-02 DIAGNOSIS — F3289 Other specified depressive episodes: Secondary | ICD-10-CM | POA: Diagnosis not present

## 2013-11-02 DIAGNOSIS — Z1211 Encounter for screening for malignant neoplasm of colon: Secondary | ICD-10-CM

## 2013-11-02 DIAGNOSIS — N186 End stage renal disease: Secondary | ICD-10-CM | POA: Diagnosis not present

## 2013-11-02 DIAGNOSIS — I10 Essential (primary) hypertension: Secondary | ICD-10-CM | POA: Diagnosis not present

## 2013-11-02 MED ORDER — SODIUM CHLORIDE 0.9 % IV SOLN: 500.0000 mL | INTRAVENOUS | Status: DC

## 2013-11-02 NOTE — Progress Notes (Signed)
Patient denies any allergies to eggs or soy. Patient denies any problems with anesthesia.  

## 2013-11-02 NOTE — Op Note (Signed)
[facility] [address] [place], [id number]   COLONOSCOPY PROCEDURE REPORT  PATIENT: Robert Gaines, Robert L.  MR#: [id number] BIRTHDATE: [birth year] , 67  yrs. old GENDER: Male ENDOSCOPIST: Roxy CedarJohn N [place] Dalporto Jr, MD REFERRED XB:JYNWBY:[place] Carolynn SayersE Jenkins, M.D. PROCEDURE DATE:  11/02/2013 PROCEDURE:   Colonoscopy, screening First Screening Colonoscopy - Avg.  risk and is 50 yrs.  old or older - No.  Prior Negative Screening - Now for repeat screening. N/A  History of Adenoma - Now for follow-up colonoscopy & has been > or = to 3 yrs.  N/A  Polyps Removed Today? No.  Recommend repeat exam, <10 yrs? No. ASA CLASS:   Class II INDICATIONS:average risk screening. MEDICATIONS: MAC sedation, administered by CRNA and propofol (Diprivan) 300mg  IV  DESCRIPTION OF PROCEDURE:   After the risks benefits and alternatives of the procedure were thoroughly explained, informed consent was obtained.  A digital rectal exam revealed no abnormalities of the rectum.   The LB [id number]  endoscope was introduced through the anus and advanced to the cecum, which was identified by both the appendix and ileocecal valve. No adverse events experienced.   The quality of the prep was good, using MoviPrep  The instrument was then slowly withdrawn as the colon was fully examined.      COLON FINDINGS: A normal appearing cecum, ileocecal valve, and appendiceal orifice were identified.  The ascending, hepatic flexure, transverse, splenic flexure, descending, sigmoid colon and rectum appeared unremarkable.  No polyps or cancers were seen. Retroflexed views revealed internal hemorrhoids. The time to cecum=4 minutes 20 seconds.  Withdrawal time=13 minutes 0 seconds. The scope was withdrawn and the procedure completed. COMPLICATIONS: There were no complications.  ENDOSCOPIC IMPRESSION: Normal colon  RECOMMENDATIONS: Continue current colorectal screening recommendations for "routine risk" patients with a  repeat colonoscopy in 10 years.   eSigned:  Roxy CedarJohn N Sanii Kukla Jr, MD 11/02/2013 1:48 PM   cc: Stacie GlazeJohn E Jenkins, MD and The Patient

## 2013-11-02 NOTE — Progress Notes (Signed)
Propofol given over incremental dosages 

## 2013-11-02 NOTE — Patient Instructions (Signed)
Discharge instructions given with verbal understanding. Normal exam. Resume previous medications. YOU HAD AN ENDOSCOPIC PROCEDURE TODAY AT THE  ENDOSCOPY CENTER: Refer to the procedure report that was given to you for any specific questions about what was found during the examination.  If the procedure report does not answer your questions, please call your gastroenterologist to clarify.  If you requested that your care partner not be given the details of your procedure findings, then the procedure report has been included in a sealed envelope for you to review at your convenience later.  YOU SHOULD EXPECT: Some feelings of bloating in the abdomen. Passage of more gas than usual.  Walking can help get rid of the air that was put into your GI tract during the procedure and reduce the bloating. If you had a lower endoscopy (such as a colonoscopy or flexible sigmoidoscopy) you may notice spotting of blood in your stool or on the toilet paper. If you underwent a bowel prep for your procedure, then you may not have a normal bowel movement for a few days.  DIET: Your first meal following the procedure should be a light meal and then it is ok to progress to your normal diet.  A half-sandwich or bowl of soup is an example of a good first meal.  Heavy or fried foods are harder to digest and may make you feel nauseous or bloated.  Likewise meals heavy in dairy and vegetables can cause extra gas to form and this can also increase the bloating.  Drink plenty of fluids but you should avoid alcoholic beverages for 24 hours.  ACTIVITY: Your care partner should take you home directly after the procedure.  You should plan to take it easy, moving slowly for the rest of the day.  You can resume normal activity the day after the procedure however you should NOT DRIVE or use heavy machinery for 24 hours (because of the sedation medicines used during the test).    SYMPTOMS TO REPORT IMMEDIATELY: A gastroenterologist  can be reached at any hour.  During normal business hours, 8:30 AM to 5:00 PM Monday through Friday, call [phone].  After hours and on weekends, please call the GI answering service at [phone] who will take a message and have the physician on call contact you.   Following lower endoscopy (colonoscopy or flexible sigmoidoscopy):  Excessive amounts of blood in the stool  Significant tenderness or worsening of abdominal pains  Swelling of the abdomen that is new, acute  Fever of 100F or higher  FOLLOW UP: If any biopsies were taken you will be contacted by phone or by letter within the next 1-3 weeks.  Call your gastroenterologist if you have not heard about the biopsies in 3 weeks.  Our staff will call the home number listed on your records the next business day following your procedure to check on you and address any questions or concerns that you may have at that time regarding the information given to you following your procedure. This is a courtesy call and so if there is no answer at the home number and we have not heard from you through the emergency physician on call, we will assume that you have returned to your regular daily activities without incident.  SIGNATURES/CONFIDENTIALITY: You and/or your care partner have signed paperwork which will be entered into your electronic medical record.  These signatures attest to the fact that that the information above on your After Visit Summary has been reviewed   and is understood.  Full responsibility of the confidentiality of this discharge information lies with you and/or your care-partner. 

## 2013-11-03 ENCOUNTER — Telehealth: Payer: Self-pay | Admitting: *Deleted

## 2013-11-03 NOTE — Telephone Encounter (Signed)
  Follow up Call-  Call back number 11/02/2013  Post procedure Call Back phone  # [phone]  Permission to leave phone message Yes     Patient questions:  Do you have a fever, pain , or abdominal swelling? no Pain Score  0 *  Have you tolerated food without any problems? yes  Have you been able to return to your normal activities? yes  Do you have any questions about your discharge instructions: Diet   no Medications  no Follow up visit  no  Do you have questions or concerns about your Care? no  Actions: * If pain score is 4 or above: No action needed, pain <4.

## 2013-11-08 ENCOUNTER — Telehealth: Payer: Self-pay | Admitting: Internal Medicine

## 2013-11-08 MED ORDER — COLCHICINE 0.6 MG PO TABS: 0.6000 mg | ORAL_TABLET | Freq: Two times a day (BID) | ORAL | Status: DC

## 2013-11-08 NOTE — Telephone Encounter (Signed)
Pt is having a gout flare up and requesting [facility]chicine [facility] [address] [place]

## 2013-11-08 NOTE — Telephone Encounter (Signed)
rx sent in electronically 

## 2013-11-18 ENCOUNTER — Other Ambulatory Visit: Payer: Medicare Other

## 2013-11-18 DIAGNOSIS — N410 Acute prostatitis: Secondary | ICD-10-CM

## 2013-11-18 DIAGNOSIS — R972 Elevated prostate specific antigen [PSA]: Secondary | ICD-10-CM

## 2013-11-19 LAB — PSA, TOTAL AND FREE
PSA, Free Pct: 25 % (ref >25)
PSA, Free: 0.63 ng/mL
PSA: 2.52 ng/mL (ref <=4.00)

## 2013-11-23 ENCOUNTER — Telehealth: Payer: Self-pay | Admitting: Internal Medicine

## 2013-11-23 NOTE — Telephone Encounter (Signed)
Pt wife is calling requesting PSA results

## 2013-11-28 NOTE — Telephone Encounter (Signed)
PSA dropped to 2.2!!!!

## 2013-11-29 NOTE — Telephone Encounter (Signed)
Pt informed

## 2014-01-11 DIAGNOSIS — M545 Low back pain, unspecified: Secondary | ICD-10-CM | POA: Diagnosis not present

## 2014-01-11 DIAGNOSIS — M25569 Pain in unspecified knee: Secondary | ICD-10-CM | POA: Diagnosis not present

## 2014-01-12 ENCOUNTER — Other Ambulatory Visit: Payer: Self-pay | Admitting: Internal Medicine

## 2014-01-24 ENCOUNTER — Ambulatory Visit: Payer: Medicare Other | Attending: Sports Medicine | Admitting: Physical Therapy

## 2014-01-24 DIAGNOSIS — M25559 Pain in unspecified hip: Secondary | ICD-10-CM | POA: Diagnosis not present

## 2014-01-24 DIAGNOSIS — M25569 Pain in unspecified knee: Secondary | ICD-10-CM | POA: Insufficient documentation

## 2014-01-24 DIAGNOSIS — IMO0001 Reserved for inherently not codable concepts without codable children: Secondary | ICD-10-CM | POA: Insufficient documentation

## 2014-01-26 ENCOUNTER — Ambulatory Visit: Payer: Medicare Other | Admitting: Physical Therapy

## 2014-01-26 DIAGNOSIS — IMO0001 Reserved for inherently not codable concepts without codable children: Secondary | ICD-10-CM | POA: Diagnosis not present

## 2014-01-31 ENCOUNTER — Other Ambulatory Visit: Payer: Self-pay | Admitting: Internal Medicine

## 2014-01-31 ENCOUNTER — Ambulatory Visit: Payer: Medicare Other | Admitting: Physical Therapy

## 2014-01-31 DIAGNOSIS — IMO0001 Reserved for inherently not codable concepts without codable children: Secondary | ICD-10-CM | POA: Diagnosis not present

## 2014-02-02 ENCOUNTER — Ambulatory Visit: Payer: Medicare Other | Admitting: Physical Therapy

## 2014-02-02 DIAGNOSIS — IMO0001 Reserved for inherently not codable concepts without codable children: Secondary | ICD-10-CM | POA: Diagnosis not present

## 2014-03-09 DIAGNOSIS — H2510 Age-related nuclear cataract, unspecified eye: Secondary | ICD-10-CM | POA: Diagnosis not present

## 2014-03-22 ENCOUNTER — Ambulatory Visit: Payer: Medicare Other | Admitting: Internal Medicine

## 2014-04-11 ENCOUNTER — Ambulatory Visit: Payer: Medicare Other | Admitting: Family Medicine

## 2014-05-11 ENCOUNTER — Encounter: Payer: Self-pay | Admitting: Family Medicine

## 2014-05-11 ENCOUNTER — Ambulatory Visit (INDEPENDENT_AMBULATORY_CARE_PROVIDER_SITE_OTHER): Payer: Medicare Other | Admitting: Family Medicine

## 2014-05-11 VITALS — BP 120/72 | HR 88 | Temp 97.4°F | Wt 178.0 lb

## 2014-05-11 DIAGNOSIS — Z23 Encounter for immunization: Secondary | ICD-10-CM

## 2014-05-11 DIAGNOSIS — N183 Chronic kidney disease, stage 3 unspecified: Secondary | ICD-10-CM

## 2014-05-11 DIAGNOSIS — I10 Essential (primary) hypertension: Secondary | ICD-10-CM

## 2014-05-11 DIAGNOSIS — E785 Hyperlipidemia, unspecified: Secondary | ICD-10-CM

## 2014-05-11 DIAGNOSIS — J302 Other seasonal allergic rhinitis: Secondary | ICD-10-CM | POA: Insufficient documentation

## 2014-05-11 NOTE — Assessment & Plan Note (Signed)
Unclear etiology with previous GFR closer to 30. I am glad it is improved closer to 60. Patient takes Aleve on a regular basis and I advised against this. Tylenol only. Continue lisinopril 5 mg. Check bmet next visit

## 2014-05-11 NOTE — Patient Instructions (Addendum)
Front desk-May be 6th person per full day for 30 minute visit in January or February for physical.   No changes today.   Health Maintenance Due  Topic Date Due  . Zostavax -check with insurance 08/13/2007  . Pneumococcal Polysaccharide -today  08/12/2012

## 2014-05-11 NOTE — Progress Notes (Signed)
Tana Conch, MD Phone: [phone]  Subjective:  Patient presents today to establish care with me as their new primary care provider. Patient was formerly a patient of Dr. Lovell Sheehan. Chief complaint-noted.   Hyperlipidemia-well controlled  Lab Results  Component Value Date   LDLCALC 70 02/02/2013  On statin: atorvastatin.  Regular exercise: yes Gym 3x a week.  Diet: attempts to eat well ROS- no chest pain or shortness of breath. No myalgias  Hypertension-stable CKD Stage II/III-stable BP Readings from Last 3 Encounters:  05/11/14 120/72  11/02/13 122/71  09/21/13 140/80  Home BP monitoring-no Advised avoid nsaids Compliant with medications-yes without side effects, lisinopril ROS-Denies any CP, HA, SOB, blurry vision, LE edema.   The following were reviewed and entered/updated in epic: Past Medical History  Diagnosis Date  . Rosacea   . Depression   . Hyperlipidemia   . Hypertension    Patient Active Problem List   Diagnosis Date Noted  . Acute gouty arthropathy 06/21/2009    Priority: Medium  . Chronic kidney disease, stage III (moderate) 04/05/2008    Priority: Medium  . Hypertension 05/31/2007    Priority: Medium  . HYPERLIPIDEMIA 05/24/2007    Priority: Medium  . Seasonal allergies 05/11/2014    Priority: Low  . PSA, INCREASED 04/16/2010    Priority: Low  . ACNE ROSACEA 11/16/2008    Priority: Low  . DEPRESSION 05/24/2007    Priority: Low   Past Surgical History  Procedure Laterality Date  . Flexible sigmoidoscopy    . Wisdom tooth extraction      Family History  Problem Relation Age of Onset  . Heart disease Father     MI 50, smoker  . Colon cancer Neg Hx   . Esophageal cancer Neg Hx   . Rectal cancer Neg Hx   . Stomach cancer Neg Hx     Medications- reviewed and updated Current Outpatient Prescriptions  Medication Sig Dispense Refill  . aspirin 81 MG tablet Take 81 mg by mouth daily.        Marland Kitchen atorvastatin (LIPITOR) 40 MG tablet TAKE 1  TABLET BY MOUTH EVERY DAY  30 tablet  8  . B Complex-C-Folic Acid (MULTIVITAMIN, STRESS FORMULA) tablet Take 1 tablet by mouth daily.        . folic acid (FOLVITE) 1 MG tablet Take 1 mg by mouth daily.        Marland Kitchen lisinopril (PRINIVIL,ZESTRIL) 10 MG tablet TAKE 0.5 TABLETS (5 MG TOTAL) BY MOUTH DAILY.  45 tablet  3  . pyridoxine (B-6) 200 MG tablet Take 200 mg by mouth daily.        . colchicine 0.6 MG tablet Take 1 tablet (0.6 mg total) by mouth 2 (two) times daily.  60 tablet  0  . KLS ALLERCLEAR D-24HR 10-240 MG per 24 hr tablet TAKE 1 TABLET BY MOUTH ONCE DAILY  30 tablet  11   No current facility-administered medications for this visit.    Allergies-reviewed and updated No Known Allergies  History   Social History  . Marital Status: Married    Spouse Name: N/A    Number of Children: N/A  . Years of Education: N/A   Social History Main Topics  . Smoking status: Never Smoker   . Smokeless tobacco: Never Used  . Alcohol Use: 0.0 oz/week     Comment: occas  . Drug Use: No  . Sexual Activity: None   Other Topics Concern  . None   Social History Narrative  Married 1978. Son from first marriage [name] (1971-psychiatrist in [place]). No grandkids.    2 dogs-sheltie/collie mix and border collie mix      Retired from Medical laboratory scientific officer: time with dogs, exercising    ROS--See HPI   Objective: BP 120/72  Pulse 88  Temp(Src) 97.4 F (36.3 C)  Wt 178 lb (80.74 kg) Gen: NAD, resting comfortably HEENT: Mucous membranes are moist. Oropharynx normal Neck: no thyromegaly CV: RRR no murmurs rubs or gallops Lungs: CTAB no crackles, wheeze, rhonchi Abdomen: soft/nontender/nondistended/normal bowel sounds. No rebound or guarding.  Ext: no edema Skin: warm, dry Neuro: grossly normal, moves all extremities, PERRLA  Assessment/Plan:  HYPERLIPIDEMIA Well-controlled on Lipitor. Continue current medication.  Chronic kidney disease, stage III  (moderate) Unclear etiology with previous GFR closer to 30. I am glad it is improved closer to 60. Patient takes Aleve on a regular basis and I advised against this. Tylenol only. Continue lisinopril 5 mg. Check bmet next visit  Hypertension Well-controlled on lisinopril 5 mg. Continue.   Orders Placed This Encounter  Procedures  . Pneumococcal polysaccharide vaccine 23-valent greater than or equal to 2yo subcutaneous/IM

## 2014-05-11 NOTE — Assessment & Plan Note (Signed)
Well-controlled on Lipitor. Continue current medication.

## 2014-05-11 NOTE — Assessment & Plan Note (Signed)
Well-controlled on lisinopril 5 mg. Continue.

## 2014-06-12 ENCOUNTER — Ambulatory Visit (INDEPENDENT_AMBULATORY_CARE_PROVIDER_SITE_OTHER): Payer: Medicare Other | Admitting: Family Medicine

## 2014-06-12 ENCOUNTER — Encounter: Payer: Self-pay | Admitting: Family Medicine

## 2014-06-12 VITALS — BP 140/72 | Temp 97.5°F | Wt 182.0 lb

## 2014-06-12 DIAGNOSIS — J329 Chronic sinusitis, unspecified: Secondary | ICD-10-CM

## 2014-06-12 DIAGNOSIS — B349 Viral infection, unspecified: Secondary | ICD-10-CM | POA: Diagnosis not present

## 2014-06-12 DIAGNOSIS — B9789 Other viral agents as the cause of diseases classified elsewhere: Secondary | ICD-10-CM

## 2014-06-12 MED ORDER — AZITHROMYCIN 250 MG PO TABS: ORAL_TABLET | ORAL | Status: DC

## 2014-06-12 NOTE — Patient Instructions (Addendum)
Suspect this is a viral URI or sinusitis. I am worried about your right sinus potentially developing a bacterial infection.   I want you to stick with OTC meds as well as neti pot unless you develop the following 1. Worsening of symptoms at this point as they have started to improve 2. Lingering of symptoms that are not improving beyond 10 days (Friday or Saturday)

## 2014-06-12 NOTE — Progress Notes (Signed)
  Tana ConchStephen Hunter, MD Phone: [phone]  Subjective:   Robert Gaines is a 68 y.o. year old very pleasant male patient who presents with the following:  Cough/congestion/sinus pressure Wednesday or Thursday of last week started with some weakness and started with runny nose. Then started with cough with green sptum. Right ear with slushy sound which resolved. Right sided sinus pressure. CHicken soup last night. Tried mucinex and taking claritin-d. Aleve-pm and generic tylenol in daytime. Symptoms seem to be improving slightly today for first day.   ROS- occasional chill and slight warmth but no fever. No nausea/vomiting. Slight tightness in chest with coughing.   Past Medical History- Patient Active Problem List   Diagnosis Date Noted  . Acute gouty arthropathy 06/21/2009    Priority: Medium  . Chronic kidney disease, stage III (moderate) 04/05/2008    Priority: Medium  . Hypertension 05/31/2007    Priority: Medium  . HYPERLIPIDEMIA 05/24/2007    Priority: Medium  . Seasonal allergies 05/11/2014    Priority: Low  . PSA, INCREASED 04/16/2010    Priority: Low  . ACNE ROSACEA 11/16/2008    Priority: Low  . DEPRESSION 05/24/2007    Priority: Low   Medications- reviewed and updated Current Outpatient Prescriptions  Medication Sig Dispense Refill  . aspirin 81 MG tablet Take 81 mg by mouth daily.        Marland Kitchen. atorvastatin (LIPITOR) 40 MG tablet TAKE 1 TABLET BY MOUTH EVERY DAY  30 tablet  8  . B Complex-C-Folic Acid (MULTIVITAMIN, STRESS FORMULA) tablet Take 1 tablet by mouth daily.        . colchicine 0.6 MG tablet Take 1 tablet (0.6 mg total) by mouth 2 (two) times daily.  60 tablet  0  . folic acid (FOLVITE) 1 MG tablet Take 1 mg by mouth daily.        Marland Kitchen. KLS ALLERCLEAR D-24HR 10-240 MG per 24 hr tablet TAKE 1 TABLET BY MOUTH ONCE DAILY  30 tablet  11  . lisinopril (PRINIVIL,ZESTRIL) 10 MG tablet TAKE 0.5 TABLETS (5 MG TOTAL) BY MOUTH DAILY.  45 tablet  3  . pyridoxine (B-6) 200 MG  tablet Take 200 mg by mouth daily.         No current facility-administered medications for this visit.    Objective: BP 140/72  Temp(Src) 97.5 F (36.4 C)  Wt 182 lb (82.555 kg) Gen: NAD, resting comfortably HEENT: TM normal, oropharynx normal, nasal turbinates erythematous and swollen, right sided sinus tenderness noted over right maxillary sinus.  CV: RRR no murmurs rubs or gallops Lungs: CTAB no crackles, wheeze, rhonchi Abdomen: soft/nontender/nondistended/normal bowel sounds.  Ext: no edema Skin: warm, dry, no rash Neuro: grossly normal, moves all extremities  Assessment/Plan:  Viral Upper respiratory infection/likely R viral sinusitis Discusses symptomatic care to continue. If symptoms last beyond 10 days or worsen in regards to right maxillary sinus, provided azithromycin for bacterial superinfection. Extensive counseling on need to avoid antibiotic overuse discussed.   Return precautions advised (if symptoms dont resolve with antibiotic or worsen despite or new symptoms)  Meds ordered this encounter  Medications  . azithromycin (ZITHROMAX) 250 MG tablet    Sig: Take 2 pills on day 1, then 1 pill daily until finished (5 days total)    Dispense:  6 tablet    Refill:  0

## 2014-06-14 ENCOUNTER — Ambulatory Visit: Payer: Medicare Other | Admitting: Family Medicine

## 2014-07-12 ENCOUNTER — Ambulatory Visit: Payer: Medicare Other | Admitting: Family Medicine

## 2014-09-26 ENCOUNTER — Other Ambulatory Visit: Payer: Self-pay | Admitting: *Deleted

## 2014-09-26 ENCOUNTER — Other Ambulatory Visit: Payer: Medicare Other

## 2014-09-26 MED ORDER — ATORVASTATIN CALCIUM 40 MG PO TABS: 40.0000 mg | ORAL_TABLET | Freq: Every day | ORAL | Status: DC

## 2014-09-29 ENCOUNTER — Other Ambulatory Visit: Payer: Self-pay | Admitting: Family Medicine

## 2014-10-03 ENCOUNTER — Ambulatory Visit (INDEPENDENT_AMBULATORY_CARE_PROVIDER_SITE_OTHER): Payer: Medicare Other | Admitting: Family Medicine

## 2014-10-03 ENCOUNTER — Encounter: Payer: Self-pay | Admitting: Family Medicine

## 2014-10-03 VITALS — BP 130/60 | Temp 97.6°F | Wt 175.0 lb

## 2014-10-03 DIAGNOSIS — N401 Enlarged prostate with lower urinary tract symptoms: Secondary | ICD-10-CM | POA: Insufficient documentation

## 2014-10-03 DIAGNOSIS — M10079 Idiopathic gout, unspecified ankle and foot: Secondary | ICD-10-CM

## 2014-10-03 DIAGNOSIS — R351 Nocturia: Secondary | ICD-10-CM

## 2014-10-03 DIAGNOSIS — N183 Chronic kidney disease, stage 3 unspecified: Secondary | ICD-10-CM

## 2014-10-03 DIAGNOSIS — R739 Hyperglycemia, unspecified: Secondary | ICD-10-CM | POA: Diagnosis not present

## 2014-10-03 DIAGNOSIS — E785 Hyperlipidemia, unspecified: Secondary | ICD-10-CM

## 2014-10-03 DIAGNOSIS — I10 Essential (primary) hypertension: Secondary | ICD-10-CM | POA: Diagnosis not present

## 2014-10-03 HISTORY — DX: Benign prostatic hyperplasia with lower urinary tract symptoms: N40.1

## 2014-10-03 LAB — HEMOGLOBIN A1C: Hgb A1c MFr Bld: 6 % (ref 4.6–6.5)

## 2014-10-03 LAB — COMPREHENSIVE METABOLIC PANEL
ALT: 26 U/L (ref 0–53)
AST: 23 U/L (ref 0–37)
Albumin: 4.6 g/dL (ref 3.5–5.2)
Alkaline Phosphatase: 40 U/L (ref 39–117)
BILIRUBIN TOTAL: 0.6 mg/dL (ref 0.2–1.2)
BUN: 22 mg/dL (ref 6–23)
CALCIUM: 9.8 mg/dL (ref 8.4–10.5)
CO2: 29 meq/L (ref 19–32)
CREATININE: 1.32 mg/dL (ref 0.40–1.50)
Chloride: 105 mEq/L (ref 96–112)
GFR: 57.48 mL/min — AB (ref >60.00)
Glucose, Bld: 108 mg/dL — ABNORMAL HIGH (ref 70–99)
Potassium: 5.3 mEq/L — ABNORMAL HIGH (ref 3.5–5.1)
Sodium: 141 mEq/L (ref 135–145)
Total Protein: 6.9 g/dL (ref 6.0–8.3)

## 2014-10-03 LAB — CBC
HEMATOCRIT: 43.4 % (ref 39.0–52.0)
Hemoglobin: 14.9 g/dL (ref 13.0–17.0)
MCHC: 34.4 g/dL (ref 30.0–36.0)
MCV: 87.9 fl (ref 78.0–100.0)
PLATELETS: 293 10*3/uL (ref 150.0–400.0)
RBC: 4.94 Mil/uL (ref 4.22–5.81)
RDW: 13.1 % (ref 11.5–15.5)
WBC: 6.2 10*3/uL (ref 4.0–10.5)

## 2014-10-03 LAB — LIPID PANEL
CHOL/HDL RATIO: 4
Cholesterol: 173 mg/dL (ref 0–200)
HDL: 48.6 mg/dL (ref >39.00)
LDL CALC: 94 mg/dL (ref 0–99)
NonHDL: 124.4
Triglycerides: 151 mg/dL — ABNORMAL HIGH (ref 0.0–149.0)
VLDL: 30.2 mg/dL (ref 0.0–40.0)

## 2014-10-03 LAB — PSA: PSA: 2.6 ng/mL (ref 0.10–4.00)

## 2014-10-03 LAB — TSH: TSH: 2.62 u[IU]/mL (ref 0.35–4.50)

## 2014-10-03 LAB — URIC ACID: Uric Acid, Serum: 8.7 mg/dL — ABNORMAL HIGH (ref 4.0–7.8)

## 2014-10-03 NOTE — Patient Instructions (Addendum)
Update labs today  Target weight at home between 165-170 as long as no diabetes, may push lower if this develops. Glad you have made some lifestyle changes  Mychart message with results. Next year can let me know a few weeks before by mychart and I can order your fasting labs.   Keep an eye on spot R low back  See me in 6 months.

## 2014-10-03 NOTE — Assessment & Plan Note (Signed)
Continue lipitor, check lipids today.

## 2014-10-03 NOTE — Assessment & Plan Note (Signed)
Check uric acid. Continue prn colchicine. If uric acid >6 consider allopurinol.

## 2014-10-03 NOTE — Assessment & Plan Note (Signed)
Nocturia twice a night. Does not want medication at this time. Check PSA given enlargement with nocturia.

## 2014-10-03 NOTE — Progress Notes (Signed)
Tana ConchStephen Hunter, MD Phone: [phone]  Subjective:  Patient presents today for follow up of chronic medical conditions including gout, hyperlipidemia, hypertension, CKD stage III.   Patient has not had a gout attack since his visit in September. He has colchcine as needed for attacks 1-2x a year. Not on uric acid lowering agent. His cholesterol was well controlled on lipitor on last check. His blood pressure is controlled on lisiopril 5mg . This also protects him from progression of CKD though on low dose only.   In addition, patient has a history of elevated PSA. He has an enlarged prostate on exam today and reports Nocturia twice a night so we opted for repeat PSA testing.    Going to gym 3x a week. Hoping to maintain weight around 170 at home. Has cut out sweet tea on regular basis after going to wife dm education classes. Cutting down on sweeteners. He is getting regular dental exams, eye exams, wearing sunscreen. Does not see dermatologist. Opts for PSA testing. Diet reasonable.    ROS-Denies any CP, HA, SOB, blurry vision, LE edema. No hot/swollen joints. No oliguria or changes in urination.   The following were reviewed and entered/updated in epic (no changes have occurred since establish visit): Past Medical History  Diagnosis Date  . Rosacea   . Depression   . Hyperlipidemia   . Hypertension    Patient Active Problem List   Diagnosis Date Noted  . Hyperglycemia 10/03/2014    Priority: Medium  . BPH associated with nocturia 10/03/2014    Priority: Medium  . Gout 06/21/2009    Priority: Medium  . Chronic kidney disease, stage III (moderate) 04/05/2008    Priority: Medium  . Hypertension 05/31/2007    Priority: Medium  . Hyperlipidemia 05/24/2007    Priority: Medium  . Seasonal allergies 05/11/2014    Priority: Low  . PSA, INCREASED 04/16/2010    Priority: Low  . ACNE ROSACEA 11/16/2008    Priority: Low  . DEPRESSION 05/24/2007    Priority: Low   Past Surgical  History  Procedure Laterality Date  . Flexible sigmoidoscopy    . Wisdom tooth extraction      Family History  Problem Relation Age of Onset  . Heart disease Father     MI 2057, smoker  . Colon cancer Neg Hx   . Esophageal cancer Neg Hx   . Rectal cancer Neg Hx   . Stomach cancer Neg Hx     Medications- reviewed and updated Current Outpatient Prescriptions  Medication Sig Dispense Refill  . aspirin 81 MG tablet Take 81 mg by mouth daily.      Marland Kitchen. atorvastatin (LIPITOR) 40 MG tablet Take 1 tablet (40 mg total) by mouth daily. 30 tablet 11  . B Complex-C-Folic Acid (MULTIVITAMIN, STRESS FORMULA) tablet Take 1 tablet by mouth daily.      . colchicine 0.6 MG tablet Take 1 tablet (0.6 mg total) by mouth 2 (two) times daily. 60 tablet 0  . folic acid (FOLVITE) 1 MG tablet Take 1 mg by mouth daily.      Marland Kitchen. KLS ALLERCLEAR D-24HR 10-240 MG per 24 hr tablet TAKE 1 TABLET BY MOUTH ONCE DAILY 30 tablet 11  . lisinopril (PRINIVIL,ZESTRIL) 10 MG tablet TAKE 1/2 TABLET BY MOUTH DAILY 45 tablet 3  . pyridoxine (B-6) 200 MG tablet Take 200 mg by mouth daily.       No current facility-administered medications for this visit.    Allergies-reviewed and updated No Known Allergies  History   Social History  . Marital Status: Married    Spouse Name: N/A    Number of Children: N/A  . Years of Education: N/A   Social History Main Topics  . Smoking status: Never Smoker   . Smokeless tobacco: Never Used  . Alcohol Use: 0.0 oz/week     Comment: occas  . Drug Use: No  . Sexual Activity: None   Other Topics Concern  . None   Social History Narrative   Married 1978. Son from first marriage [name] (1971-psychiatrist in [place]). No grandkids.    2 dogs-sheltie/collie mix and border collie mix      Retired from Medical laboratory scientific officer: time with dogs, exercising    ROS--See HPI   Objective: BP 130/60 mmHg  Temp(Src) 97.6 F (36.4 C)  Wt 175 lb (79.379 kg) Gen:  NAD, resting comfortably on table HEENT: Mucous membranes are moist. Oropharynx normal. Good dentition Neck: no thyromegaly, no lyphadenopathy CV: RRR no murmurs rubs or gallops Lungs: CTAB no crackles, wheeze, rhonchi Abdomen: soft/nontender/nondistended/normal bowel sounds. No rebound or guarding.  Ext: no edema Skin: warm, dry, right low back with oval shaped 8 x 4cm lesion (present for years per patient with no recent change) Neuro: grossly normal, moves all extremities, PERRLA   Assessment/Plan:  Hyperlipidemia Continue lipitor, check lipids today.    Gout Check uric acid. Continue prn colchicine. If uric acid >6 consider allopurinol.    Hypertension Controlled on lisinopril .    Chronic kidney disease, stage III (moderate) Continue lisinopril . Check BMET today. Continue to avoid nsaids.    BPH associated with nocturia Nocturia twice a night. Does not want medication at this time. Check PSA given enlargement with nocturia.     Return precautions advised. Keep an eye on spot r low back. 6 month follow up to monitor kidney function advised. AWV 1 year.    Orders Placed This Encounter  Procedures  . Uric Acid  . CBC    [place]  . Comprehensive metabolic panel    [place]    Order Specific Question:  Has the patient fasted?    Answer:  No  . Lipid panel    [place]    Order Specific Question:  Has the patient fasted?    Answer:  No  . Hemoglobin A1c    [place]  . TSH    [place]  . PSA

## 2014-10-03 NOTE — Assessment & Plan Note (Signed)
Controlled on lisinopril 5 mg 

## 2014-10-03 NOTE — Assessment & Plan Note (Signed)
Continue lisinopril 5mg . Check BMET today. Continue to avoid nsaids.

## 2014-12-12 ENCOUNTER — Other Ambulatory Visit: Payer: Self-pay | Admitting: *Deleted

## 2014-12-12 MED ORDER — ATORVASTATIN CALCIUM 40 MG PO TABS: 40.0000 mg | ORAL_TABLET | Freq: Every day | ORAL | Status: DC

## 2015-01-08 ENCOUNTER — Telehealth: Payer: Self-pay | Admitting: Family Medicine

## 2015-01-08 DIAGNOSIS — N183 Chronic kidney disease, stage 3 (moderate): Secondary | ICD-10-CM

## 2015-01-08 DIAGNOSIS — M109 Gout, unspecified: Secondary | ICD-10-CM

## 2015-01-08 NOTE — Telephone Encounter (Signed)
Dt hunter had suggested pt recheck potasium in 2-3 mos for a visit 2/9. Can you put the order in?

## 2015-01-09 ENCOUNTER — Other Ambulatory Visit (INDEPENDENT_AMBULATORY_CARE_PROVIDER_SITE_OTHER): Payer: Medicare Other

## 2015-01-09 DIAGNOSIS — M109 Gout, unspecified: Secondary | ICD-10-CM | POA: Diagnosis not present

## 2015-01-09 DIAGNOSIS — N183 Chronic kidney disease, stage 3 (moderate): Secondary | ICD-10-CM

## 2015-01-09 LAB — COMPREHENSIVE METABOLIC PANEL
ALK PHOS: 38 U/L — AB (ref 39–117)
ALT: 22 U/L (ref 0–53)
AST: 25 U/L (ref 0–37)
Albumin: 4.5 g/dL (ref 3.5–5.2)
BILIRUBIN TOTAL: 0.7 mg/dL (ref 0.2–1.2)
BUN: 21 mg/dL (ref 6–23)
CALCIUM: 9.4 mg/dL (ref 8.4–10.5)
CO2: 29 mEq/L (ref 19–32)
Chloride: 104 mEq/L (ref 96–112)
Creatinine, Ser: 1.39 mg/dL (ref 0.40–1.50)
GFR: 54.11 mL/min — ABNORMAL LOW (ref >60.00)
Glucose, Bld: 99 mg/dL (ref 70–99)
Potassium: 3.9 mEq/L (ref 3.5–5.1)
Sodium: 138 mEq/L (ref 135–145)
TOTAL PROTEIN: 6.9 g/dL (ref 6.0–8.3)

## 2015-01-09 LAB — URIC ACID: URIC ACID, SERUM: 9.2 mg/dL — AB (ref 4.0–7.8)

## 2015-01-09 NOTE — Telephone Encounter (Signed)
Orders entered

## 2015-01-16 ENCOUNTER — Encounter: Payer: Self-pay | Admitting: Family Medicine

## 2015-01-16 ENCOUNTER — Ambulatory Visit (INDEPENDENT_AMBULATORY_CARE_PROVIDER_SITE_OTHER): Payer: Medicare Other | Admitting: Family Medicine

## 2015-01-16 DIAGNOSIS — M10079 Idiopathic gout, unspecified ankle and foot: Secondary | ICD-10-CM

## 2015-01-16 DIAGNOSIS — N183 Chronic kidney disease, stage 3 unspecified: Secondary | ICD-10-CM

## 2015-01-16 DIAGNOSIS — I10 Essential (primary) hypertension: Secondary | ICD-10-CM

## 2015-01-16 DIAGNOSIS — E785 Hyperlipidemia, unspecified: Secondary | ICD-10-CM | POA: Diagnosis not present

## 2015-01-16 MED ORDER — COLCHICINE 0.6 MG PO TABS: 0.6000 mg | ORAL_TABLET | Freq: Every day | ORAL | Status: DC

## 2015-01-16 MED ORDER — ALLOPURINOL 100 MG PO TABS: 100.0000 mg | ORAL_TABLET | Freq: Every day | ORAL | Status: DC

## 2015-01-16 NOTE — Assessment & Plan Note (Signed)
Well controlled with LDL <100. Continue current meds: lipitor 40mg . Need this for CKD III

## 2015-01-16 NOTE — Assessment & Plan Note (Signed)
GFR still in 55-60 range after previously as low as 35. COntinue BP, lipid control. Also think lowering uric acid may benefit potentially.

## 2015-01-16 NOTE — Assessment & Plan Note (Signed)
Good control, lisinopril also likely renal protective.

## 2015-01-16 NOTE — Patient Instructions (Signed)
Kidney function stable- no changes  Gout Uric acid 9.2 and goal less than 6 Start allopurinol 100mg  and take daily For first 2 weeks, take 1 colchicine a day After 2 weeks, continue allopurinol but stop colchicine  Get uric acid level checked 2-3 months and may further increase allopurinol to 200mg 

## 2015-01-16 NOTE — Assessment & Plan Note (Signed)
Uric acid 9.2. Start allopurinol 100mg  with follow up labs 2-3 months and likely titrate at that time. For first 2 weeks, also use colchicine daily

## 2015-01-16 NOTE — Progress Notes (Signed)
Tana ConchStephen Rogene Meth, MD  Subjective:  Robert Gaines is a 69 y.o. year old very pleasant male patient who presents with:  CKD Stage III- stable with BP and lipid control Gout- stable but with elevated uric acid, no flares in last year, previously 1-2x a year Hyperlipidemia-controlled on atorvastatin 40mg  Hypertension-controlled on lisinopril 5mg   ROS- no chest pain, shortness of breath, hot swollen joint, myalgias  Past Medical History- hyperglycemia, BPH  Medications- reviewed and updated Current Outpatient Prescriptions  Medication Sig Dispense Refill  . aspirin 81 MG tablet Take 81 mg by mouth daily.      Marland Kitchen. atorvastatin (LIPITOR) 40 MG tablet Take 1 tablet (40 mg total) by mouth daily. 90 tablet 3  . B Complex-C-Folic Acid (MULTIVITAMIN, STRESS FORMULA) tablet Take 1 tablet by mouth daily.      . colchicine 0.6 MG tablet Take 1 tablet (0.6 mg total) by mouth 2 (two) times daily. 60 tablet 0  . folic acid (FOLVITE) 1 MG tablet Take 1 mg by mouth daily.      Marland Kitchen. KLS ALLERCLEAR D-24HR 10-240 MG per 24 hr tablet TAKE 1 TABLET BY MOUTH ONCE DAILY 30 tablet 11  . lisinopril (PRINIVIL,ZESTRIL) 10 MG tablet TAKE 1/2 TABLET BY MOUTH DAILY 45 tablet 3  . pyridoxine (B-6) 200 MG tablet Take 200 mg by mouth daily.       No current facility-administered medications for this visit.    Objective: BP 138/68 mmHg  Pulse 69  Temp(Src) 97.4 F (36.3 C)  Wt 173 lb (78.472 kg) Gen: NAD, resting comfortably CV: RRR no murmurs rubs or gallops Lungs: CTAB no crackles, wheeze, rhonchi Abdomen: soft/nontender/nondistended/normal bowel sounds.  Ext: no edema Skin: warm, dry, no rash Neuro: grossly normal, moves all extremities   Assessment/Plan:  Hyperlipidemia Well controlled with LDL <100. Continue current meds: lipitor 40mg . Need this for CKD III    Gout Uric acid 9.2. Start allopurinol 100mg  with follow up labs 2-3 months and likely titrate at that time. For first 2 weeks, also use colchicine  daily   Hypertension Good control, lisinopril also likely renal protective.    Chronic kidney disease, stage III (moderate) GFR still in 55-60 range after previously as low as 35. COntinue BP, lipid control. Also think lowering uric acid may benefit potentially.    2-3 month uric acid. Likely in office 6 months  Orders Placed This Encounter  Procedures  . Uric Acid    Standing Status: Future     Number of Occurrences:      Standing Expiration Date: 01/16/2016    Meds ordered this encounter  Medications  . allopurinol (ZYLOPRIM) 100 MG tablet    Sig: Take 1-2 tablets (100-200 mg total) by mouth daily. Start with 100mg  until next lab    Dispense:  60 tablet    Refill:  5  . colchicine 0.6 MG tablet    Sig: Take 1 tablet (0.6 mg total) by mouth daily. If not on daily and  If flare, can use 2 tabs followed by repeat dose in 2 hours ifnot resolved    Dispense:  60 tablet    Refill:  2

## 2015-01-29 ENCOUNTER — Other Ambulatory Visit: Payer: Self-pay | Admitting: *Deleted

## 2015-01-29 MED ORDER — LORATADINE-PSEUDOEPHEDRINE ER 10-240 MG PO TB24: 1.0000 | ORAL_TABLET | Freq: Every day | ORAL | Status: DC

## 2015-04-02 ENCOUNTER — Other Ambulatory Visit (INDEPENDENT_AMBULATORY_CARE_PROVIDER_SITE_OTHER): Payer: Medicare Other

## 2015-04-02 DIAGNOSIS — M10079 Idiopathic gout, unspecified ankle and foot: Secondary | ICD-10-CM | POA: Diagnosis not present

## 2015-04-02 LAB — URIC ACID: Uric Acid, Serum: 7 mg/dL (ref 4.0–7.8)

## 2015-04-02 MED ORDER — ALLOPURINOL 100 MG PO TABS: ORAL_TABLET | ORAL | Status: DC

## 2015-04-10 ENCOUNTER — Other Ambulatory Visit: Payer: Self-pay

## 2015-04-10 MED ORDER — ALLOPURINOL 100 MG PO TABS: ORAL_TABLET | ORAL | Status: DC

## 2015-04-12 DIAGNOSIS — H40013 Open angle with borderline findings, low risk, bilateral: Secondary | ICD-10-CM | POA: Diagnosis not present

## 2015-05-14 ENCOUNTER — Other Ambulatory Visit (INDEPENDENT_AMBULATORY_CARE_PROVIDER_SITE_OTHER): Payer: Medicare Other

## 2015-05-14 DIAGNOSIS — M10079 Idiopathic gout, unspecified ankle and foot: Secondary | ICD-10-CM

## 2015-05-14 LAB — URIC ACID: Uric Acid, Serum: 5.6 mg/dL (ref 4.0–7.8)

## 2015-05-24 ENCOUNTER — Ambulatory Visit (INDEPENDENT_AMBULATORY_CARE_PROVIDER_SITE_OTHER): Payer: Medicare Other | Admitting: Family Medicine

## 2015-05-24 DIAGNOSIS — Z23 Encounter for immunization: Secondary | ICD-10-CM

## 2015-07-30 ENCOUNTER — Ambulatory Visit: Payer: Medicare Other | Admitting: Family Medicine

## 2015-07-31 ENCOUNTER — Other Ambulatory Visit: Payer: Self-pay | Admitting: Family Medicine

## 2015-09-16 ENCOUNTER — Other Ambulatory Visit: Payer: Self-pay | Admitting: Family Medicine

## 2015-09-20 ENCOUNTER — Encounter: Payer: Self-pay | Admitting: Family Medicine

## 2015-09-20 NOTE — Telephone Encounter (Signed)
Uric acid under gout Cbc, cmet, lipid under hyperlipidemia PSA under bph with nocturia   thanks

## 2015-09-21 ENCOUNTER — Other Ambulatory Visit: Payer: Self-pay | Admitting: Family Medicine

## 2015-09-21 DIAGNOSIS — E785 Hyperlipidemia, unspecified: Secondary | ICD-10-CM

## 2015-09-21 DIAGNOSIS — M10079 Idiopathic gout, unspecified ankle and foot: Secondary | ICD-10-CM

## 2015-09-21 DIAGNOSIS — R351 Nocturia: Principal | ICD-10-CM

## 2015-09-21 DIAGNOSIS — N401 Enlarged prostate with lower urinary tract symptoms: Secondary | ICD-10-CM

## 2015-10-04 ENCOUNTER — Other Ambulatory Visit (INDEPENDENT_AMBULATORY_CARE_PROVIDER_SITE_OTHER): Payer: Medicare Other

## 2015-10-04 DIAGNOSIS — E785 Hyperlipidemia, unspecified: Secondary | ICD-10-CM | POA: Diagnosis not present

## 2015-10-04 DIAGNOSIS — N401 Enlarged prostate with lower urinary tract symptoms: Secondary | ICD-10-CM

## 2015-10-04 DIAGNOSIS — R351 Nocturia: Secondary | ICD-10-CM | POA: Diagnosis not present

## 2015-10-04 DIAGNOSIS — M10079 Idiopathic gout, unspecified ankle and foot: Secondary | ICD-10-CM | POA: Diagnosis not present

## 2015-10-04 LAB — COMPREHENSIVE METABOLIC PANEL
ALT: 22 U/L (ref 0–53)
AST: 24 U/L (ref 0–37)
Albumin: 4.7 g/dL (ref 3.5–5.2)
Alkaline Phosphatase: 39 U/L (ref 39–117)
BUN: 21 mg/dL (ref 6–23)
CALCIUM: 9.8 mg/dL (ref 8.4–10.5)
CHLORIDE: 102 meq/L (ref 96–112)
CO2: 30 meq/L (ref 19–32)
CREATININE: 1.34 mg/dL (ref 0.40–1.50)
GFR: 56.32 mL/min — ABNORMAL LOW (ref >60.00)
Glucose, Bld: 107 mg/dL — ABNORMAL HIGH (ref 70–99)
POTASSIUM: 5 meq/L (ref 3.5–5.1)
SODIUM: 139 meq/L (ref 135–145)
Total Bilirubin: 0.8 mg/dL (ref 0.2–1.2)
Total Protein: 6.9 g/dL (ref 6.0–8.3)

## 2015-10-04 LAB — LIPID PANEL
CHOL/HDL RATIO: 4
Cholesterol: 191 mg/dL (ref 0–200)
HDL: 47.6 mg/dL (ref >39.00)
LDL CALC: 104 mg/dL — AB (ref 0–99)
NonHDL: 143.25
Triglycerides: 195 mg/dL — ABNORMAL HIGH (ref 0.0–149.0)
VLDL: 39 mg/dL (ref 0.0–40.0)

## 2015-10-04 LAB — CBC
HCT: 43.7 % (ref 39.0–52.0)
Hemoglobin: 14.5 g/dL (ref 13.0–17.0)
MCHC: 33.2 g/dL (ref 30.0–36.0)
MCV: 91.2 fl (ref 78.0–100.0)
Platelets: 265 10*3/uL (ref 150.0–400.0)
RBC: 4.79 Mil/uL (ref 4.22–5.81)
RDW: 13.5 % (ref 11.5–15.5)
WBC: 6.4 10*3/uL (ref 4.0–10.5)

## 2015-10-04 LAB — URIC ACID: URIC ACID, SERUM: 6 mg/dL (ref 4.0–7.8)

## 2015-10-04 LAB — PSA: PSA: 4.15 ng/mL — ABNORMAL HIGH (ref 0.10–4.00)

## 2015-10-09 ENCOUNTER — Encounter: Payer: Self-pay | Admitting: Family Medicine

## 2015-10-09 ENCOUNTER — Ambulatory Visit (INDEPENDENT_AMBULATORY_CARE_PROVIDER_SITE_OTHER): Payer: Medicare Other | Admitting: Family Medicine

## 2015-10-09 VITALS — BP 126/64 | HR 72 | Temp 97.7°F | Ht 67.0 in | Wt 177.0 lb

## 2015-10-09 DIAGNOSIS — R351 Nocturia: Secondary | ICD-10-CM

## 2015-10-09 DIAGNOSIS — R6889 Other general symptoms and signs: Secondary | ICD-10-CM

## 2015-10-09 DIAGNOSIS — Z0001 Encounter for general adult medical examination with abnormal findings: Secondary | ICD-10-CM

## 2015-10-09 DIAGNOSIS — Z20828 Contact with and (suspected) exposure to other viral communicable diseases: Secondary | ICD-10-CM

## 2015-10-09 DIAGNOSIS — R739 Hyperglycemia, unspecified: Secondary | ICD-10-CM

## 2015-10-09 DIAGNOSIS — R972 Elevated prostate specific antigen [PSA]: Secondary | ICD-10-CM | POA: Diagnosis not present

## 2015-10-09 DIAGNOSIS — Z23 Encounter for immunization: Secondary | ICD-10-CM | POA: Diagnosis not present

## 2015-10-09 DIAGNOSIS — N401 Enlarged prostate with lower urinary tract symptoms: Secondary | ICD-10-CM

## 2015-10-09 NOTE — Patient Instructions (Addendum)
Final pneumonia shot ([id number]) received today.  Return in 3 months for labs. We will decide on follow up at that point. I would likely say see me at 6 months from today for blood pressure check in but if blood pressure is doing great and no gout flares, 1 year for annual wellness visit likely ok.    Robert Gaines , Thank you for taking time to come for your Medicare Wellness Visit. I appreciate your ongoing commitment to your health goals. Please review the following plan we discussed and let me know if I can assist you in the future.   These are the goals we discussed: 1. [place]rive for 165-170 on home scales   This is a list of the screening recommended for you and due dates:  Health Maintenance  Topic Date Due  .  Hepatitis C: One time screening is recommended by Center for Disease Control  (CDC) for  adults born from [birth year] through 1964.   25-Nov-1945  . Shingles Vaccine  10/24/2019*  . Flu Shot  03/25/2016  . Tetanus Vaccine  04/05/2018  . Colon Cancer Screening  11/03/2023  . Pneumonia vaccines  Completed  *Topic was postponed. The date shown is not the original due date.

## 2015-10-09 NOTE — Progress Notes (Signed)
Tana Conch, MD Phone: [phone]  Subjective:  Patient presents today for their annual wellness visit.    Preventive Screening-Counseling & Management  Smoking Status: Never Smoker Second Hand Smoking status: No smokers in home  Risk Factors Regular exercise: 3x a week stretching/cardio- walks quickly, does some lifting as well with 1.5-2 hours Diet: reasonable  Fall Risk: None   Cardiac risk factors:  advanced age (older than 44 for men, 2 for women)  Hyperlipidemia -mild poor control but on atrovastatin  No diabetes.  Family History: father at age 40 had MI   Depression Screen None. PHQ2 0   Activities of Daily Living Independent ADLs and IADLs   Hearing Difficulties: -patient declines  Cognitive Testing No reported trouble.   Normal 3 word recall  List the Names of Other Physician/Practitioners you currently use: -Dr. Nile Riggs optho  Immunization History  Administered Date(s) Administered  . Influenza Split 06/05/2011, 05/06/2012  . Influenza Whole 08/25/2000, 05/31/2007, 05/17/2009, 06/06/2010  . Influenza,inj,Quad PF,36+ Mos 08/09/2013, 05/11/2014, 05/24/2015  . Pneumococcal Conjugate-13 10/09/2015  . Pneumococcal Polysaccharide-23 05/11/2014  . Td 06/25/1998, 04/05/2008   Required Immunizations needed today prevnar 13 given  Screening tests- up to date Health Maintenance Due  Topic Date Due  . Hepatitis C Screening - next labs 05-30-1946   ROS- No pertinent positives discovered in course of AWV. Has had some plantar fasciitis - right heel pain.  Pertinent to prostate issues- no increased nocturia, polyuria. No bony pain. No increased fatigue.   The following were reviewed and entered/updated in epic: Past Medical History  Diagnosis Date  . Rosacea   . Depression   . Hyperlipidemia   . Hypertension   . BPH associated with nocturia 10/03/2014  . PSA, INCREASED 04/16/2010    History prostatitis x 2, # decreased with  ciprofloxacin     Patient Active Problem List   Diagnosis Date Noted  . Hyperglycemia 10/03/2014    Priority: Medium  . BPH associated with nocturia 10/03/2014    Priority: Medium  . Gout 06/21/2009    Priority: Medium  . Chronic kidney disease, stage III (moderate) 04/05/2008    Priority: Medium  . Hypertension 05/31/2007    Priority: Medium  . Hyperlipidemia 05/24/2007    Priority: Medium  . Seasonal allergies 05/11/2014    Priority: Low  . PSA, INCREASED 04/16/2010    Priority: Low  . ACNE ROSACEA 11/16/2008    Priority: Low  . DEPRESSION 05/24/2007    Priority: Low   Past Surgical History  Procedure Laterality Date  . Flexible sigmoidoscopy    . Wisdom tooth extraction      Family History  Problem Relation Age of Onset  . Heart disease Father     MI 65, smoker  . Colon cancer Neg Hx   . Esophageal cancer Neg Hx   . Rectal cancer Neg Hx   . Stomach cancer Neg Hx     Medications- reviewed and updated Current Outpatient Prescriptions  Medication Sig Dispense Refill  . allopurinol (ZYLOPRIM) 100 MG tablet Take two pills daily. 60 tablet 6  . aspirin 81 MG tablet Take 81 mg by mouth daily.      Marland Kitchen atorvastatin (LIPITOR) 40 MG tablet Take 1 tablet (40 mg total) by mouth daily. 90 tablet 3  . Calcium Carbonate-Vitamin D (CALCIUM-VITAMIN D) 500-200 MG-UNIT tablet Take 1 tablet by mouth daily.    . colchicine 0.6 MG tablet Take 1 tablet (0.6 mg total) by mouth daily. If not on daily and  If flare, can use 2 tabs followed by repeat dose in 2 hours ifnot resolved (Patient not taking: Reported on 10/09/2015) 60 tablet 2  . folic acid (FOLVITE) 1 MG tablet Take 1 mg by mouth daily.      Marland Kitchen glucosamine-chondroitin 500-400 MG tablet Take 1 tablet by mouth 3 (three) times daily.    Marland Kitchen lisinopril (PRINIVIL,ZESTRIL) 10 MG tablet TAKE 1/2 TABLET BY MOUTH DAILY 45 tablet 3  . loratadine-pseudoephedrine (KLS ALLERCLEAR D-24HR) 10-240 MG per 24 hr tablet Take 1 tablet by mouth daily. 30  tablet 11  . Multiple Vitamins-Minerals (CENTRUM SILVER PO) Take by mouth.    . pyridoxine (B-6) 200 MG tablet Take 200 mg by mouth daily.       No current facility-administered medications for this visit.    Allergies-reviewed and updated No Known Allergies  Social History   Social History  . Marital Status: Married    Spouse Name: N/A  . Number of Children: N/A  . Years of Education: N/A   Social History Main Topics  . Smoking status: Never Smoker   . Smokeless tobacco: Never Used  . Alcohol Use: 0.0 oz/week     Comment: occas  . Drug Use: No  . Sexual Activity: Not Asked   Other Topics Concern  . None   Social History Narrative   Married 1978. Son from first marriage [name] (1971-psychiatrist in [place]). No grandkids.    2 dogs-sheltie/collie mix and border collie mix      Retired from Special educational needs teacher      Hobbies: time with dogs, exercising    Objective: BP 126/64 mmHg  Pulse 72  Temp(Src) 97.7 F (36.5 C)  Ht 5\' 7"  (1.702 m)  Wt 177 lb (80.287 kg)  BMI 27.72 kg/m2 Gen: NAD, resting comfortably HEENT: Mucous membranes are moist. Oropharynx normal Neck: no thyromegaly CV: RRR no murmurs rubs or gallops Lungs: CTAB no crackles, wheeze, rhonchi Abdomen: soft/nontender/nondistended/normal bowel sounds. No rebound or guarding.  Rectal: normal tone- non boggy, diffusely enlarged prostate, no masses or tenderness Ext: no edema Skin: warm, dry Neuro: grossly normal, moves all extremities, PERRLA  Assessment/Plan:  AWV completed  BPH associated with nocturia Elevated PSA S: known BPH. Also known history of prostatitis leading to increased PSA in past and resolved with ciprofloxacin treatment. At present has elevated PSA but no obvious prostatitis on exam A/P: we discussed referring to urology vs. Repeat PSA in 3 months. Without obvious prostatitis on exam- discussed guidelines in primary care lean against treating empirically. He opted  for 3 month PSA check.     3 month return for labs. Follow up 6 months for BP check. 1 year for Awv if no gout flares and bp controlled at home and PSA normalizes Return precautions advised.   Orders Placed This Encounter  Procedures  . Pneumococcal conjugate vaccine 13-valent  . Hepatitis C antibody, reflex    [facility]    Standing Status: Future     Number of Occurrences:      Standing Expiration Date: 10/08/2016  . Basic metabolic panel    Standing Status: Future     Number of Occurrences:      Standing Expiration Date: 10/08/2016  . Hemoglobin A1c    [facility]    Standing Status: Future     Number of Occurrences:      Standing Expiration Date: 10/08/2016  . PSA    Standing Status: Future     Number of Occurrences:  Standing Expiration Date: 10/08/2016    Meds ordered this encounter  Medications  . Calcium Carbonate-Vitamin D (CALCIUM-VITAMIN D) 500-200 MG-UNIT tablet    Sig: Take 1 tablet by mouth daily.  Marland Kitchen glucosamine-chondroitin 500-400 MG tablet    Sig: Take 1 tablet by mouth 3 (three) times daily.  . Multiple Vitamins-Minerals (CENTRUM SILVER PO)    Sig: Take by mouth.

## 2015-10-10 ENCOUNTER — Encounter: Payer: Self-pay | Admitting: Family Medicine

## 2015-10-10 NOTE — Assessment & Plan Note (Addendum)
Elevated PSA S: known BPH. Also known history of prostatitis leading to increased PSA in past and resolved with ciprofloxacin treatment. At present has elevated PSA but no obvious prostatitis on exam A/P: we discussed referring to urology vs. Repeat PSA in 3 months. Without obvious prostatitis on exam- discussed guidelines in primary care lean against treating empirically. He opted for 3 month PSA check. Long discussion about patient concerns about biopsy potential as well as fluctuations of PSA

## 2015-11-14 ENCOUNTER — Telehealth: Payer: Self-pay | Admitting: Family Medicine

## 2015-11-14 NOTE — Telephone Encounter (Signed)
FYI - patient has an appointment 11/15/15.  Patient is requesting a zpak

## 2015-11-14 NOTE — Telephone Encounter (Signed)
noted 

## 2015-11-14 NOTE — Telephone Encounter (Signed)
Patient Name: Robert Gaines  DOB: Jul 23, 1946    Initial Comment Caller states her husband started taking Mucinex when he started to feel sick two days ago. She wants to know if he should be taking something else? He is having cold chills and a bad cough.    Nurse Assessment  Nurse: Sherilyn CooterHenry, RN, Thurmond ButtsWade Date/Time Lamount Cohen(Eastern Time): 11/14/2015 1:26:12 PM  Confirm and document reason for call. If symptomatic, describe symptoms. You must click the next button to save text entered. ---Patient states that he has a cough for the past couple days. He is coughing up "green stuff." He is taking Mucinex DM. He is also taking Aleve for "general feeling." He was hoping to be able to get a Zpk. Denies difficulty breathing. He has a scratchy throat;. He has had low grade fevers off and on. He does this by touch, not a thermometer. He has chills at times.  Has the patient traveled out of the country within the last 30 days? ---No  Does the patient have any new or worsening symptoms? ---Yes  Will a triage be completed? ---Yes  Related visit to physician within the last 2 weeks? ---No  Does the PT have any chronic conditions? (i.e. diabetes, asthma, etc.) ---Yes  List chronic conditions. ---Hypercholesterolemia, Gout, HTN  Is this a behavioral health or substance abuse call? ---No     Guidelines    Guideline Title Affirmed Question Affirmed Notes  Cough - Acute Productive Cough (all triage questions negative)    Final Disposition User   Home Care Sherilyn CooterHenry, RN, Thurmond ButtsWade    Referrals  REFERRED TO PCP OFFICE   Disagree/Comply: Disagree  Disagree/Comply Reason: Disagree with instructions

## 2015-11-15 ENCOUNTER — Ambulatory Visit (INDEPENDENT_AMBULATORY_CARE_PROVIDER_SITE_OTHER): Payer: Medicare Other | Admitting: Family Medicine

## 2015-11-15 ENCOUNTER — Encounter: Payer: Self-pay | Admitting: Family Medicine

## 2015-11-15 VITALS — BP 140/80 | HR 79 | Temp 98.0°F | Resp 20 | Ht 67.0 in | Wt 174.0 lb

## 2015-11-15 DIAGNOSIS — J069 Acute upper respiratory infection, unspecified: Secondary | ICD-10-CM

## 2015-11-15 NOTE — Progress Notes (Signed)
PCP: Tana Conch, MD  Subjective:  Robert Gaines is a 70 y.o. year old very pleasant male patient who presents with Upper Respiratory infection symptoms including nasal congestion (but minimal), sore throat due to drainage), cough productive of dark green sputum -started: monday -symptoms show no change -previous treatments: claritin D, mucinex DM -sick contacts/travel/risks: denies flu exposure. Wife has been sick with similar illness but no productive cough -Hx of: allergies  ROS-denies fever (does have some subjective warmth at night), SOB, NVD, tooth pain  Pertinent Past Medical History-  Patient Active Problem List   Diagnosis Date Noted  . Hyperglycemia 10/03/2014    Priority: Medium  . BPH associated with nocturia 10/03/2014    Priority: Medium  . Gout 06/21/2009    Priority: Medium  . Chronic kidney disease, stage III (moderate) 04/05/2008    Priority: Medium  . Hypertension 05/31/2007    Priority: Medium  . Hyperlipidemia 05/24/2007    Priority: Medium  . Seasonal allergies 05/11/2014    Priority: Low  . PSA, INCREASED 04/16/2010    Priority: Low  . ACNE ROSACEA 11/16/2008    Priority: Low  . DEPRESSION 05/24/2007    Priority: Low   Medications- reviewed  Current Outpatient Prescriptions  Medication Sig Dispense Refill  . allopurinol (ZYLOPRIM) 100 MG tablet Take two pills daily. 60 tablet 6  . aspirin 81 MG tablet Take 81 mg by mouth daily.      Marland Kitchen atorvastatin (LIPITOR) 40 MG tablet Take 1 tablet (40 mg total) by mouth daily. 90 tablet 3  . Calcium Carbonate-Vitamin D (CALCIUM-VITAMIN D) 500-200 MG-UNIT tablet Take 1 tablet by mouth daily.    . colchicine 0.6 MG tablet Take 1 tablet (0.6 mg total) by mouth daily. If not on daily and  If flare, can use 2 tabs followed by repeat dose in 2 hours ifnot resolved 60 tablet 2  . folic acid (FOLVITE) 1 MG tablet Take 1 mg by mouth daily.      Marland Kitchen glucosamine-chondroitin 500-400 MG tablet Take 1 tablet by mouth 3  (three) times daily.    Marland Kitchen lisinopril (PRINIVIL,ZESTRIL) 10 MG tablet TAKE 1/2 TABLET BY MOUTH DAILY 45 tablet 3  . loratadine-pseudoephedrine (KLS ALLERCLEAR D-24HR) 10-240 MG per 24 hr tablet Take 1 tablet by mouth daily. 30 tablet 11  . Multiple Vitamins-Minerals (CENTRUM SILVER PO) Take by mouth.    . pyridoxine (B-6) 200 MG tablet Take 200 mg by mouth daily.       No current facility-administered medications for this visit.    Objective: BP 140/80 mmHg  Pulse 79  Temp(Src) 98 F (36.7 C) (Oral)  Resp 20  Ht  (1.702 m)  Wt 174 lb (78.926 kg)  BMI 27.25 kg/m2  SpO2 97% Gen: NAD, resting comfortably HEENT: Turbinates erythematous, TM normal, pharynx mildly erythematous with no tonsilar exudate or edema, no sinus tenderness CV: RRR no murmurs rubs or gallops Lungs: CTAB no crackles, wheeze, rhonchi Abdomen: soft/nontender/nondistended/normal bowel sounds.  Ext: no edema Skin: warm, dry, no rash Neuro: grossly normal, moves all extremities  Assessment/Plan:  Upper Respiratory infection History and exam today are suggestive of viral URI.  We discussed that a serious infection or illness is unlikely. We also discussed reasons why current illness does not meet criteria for bacterial illness and therefore no antibiotics indicated. Also educated on signs that bacterial infection may have developed. We discussed treatment side effects, likely course, antibiotic misuse, transmission, and signs of developing a serious illness.  Symptomatic  treatment with: mucinex Dm, may continue claritin- D advised to watch BP (only modestly elevated today). Declines codeine cough syrup as states cough not that bad  Finally, we reviewed reasons to return to care including if symptoms worsen or persist or new concerns arise.  Patient does have some sinus congestion and we discussed if he has double sickening through Monday or symptoms through next Thursday would agree to z-pak for potential bacterial  sinusitis.   Wife may schedule appointment as well  The duration of face-to-face time during this visit was 20  minutes. Greater than 50% of this time was spent in counseling, explanation of diagnosis, planning of further management, and/or coordination of care.

## 2015-11-15 NOTE — Patient Instructions (Signed)
Right now- I would say you have more of an upper respiratory infection that are close to 100% viral so antibiotics would not be helpful   Signs that you may have developed bacterial infection 1. Symptoms get better over next few days then worsen again (particularly sinus pressure and congestion) 2. Symptoms last to next Thursday- particularly with sinus pressure and congestion being more prominent  If either of these develop- please give me a call and I will call in antibiotics (Azithromycin per your request as have tolerated well in past)  Upper Respiratory Infection, Adult Most upper respiratory infections (URIs) are a viral infection of the air passages leading to the lungs. A URI affects the nose, throat, and upper air passages. The most common type of URI is nasopharyngitis and is typically referred to as "the common cold." URIs run their course and usually go away on their own. Most of the time, a URI does not require medical attention, but sometimes a bacterial infection in the upper airways can follow a viral infection. This is called a secondary infection. Sinus and middle ear infections are common types of secondary upper respiratory infections. Bacterial pneumonia can also complicate a URI. A URI can worsen asthma and chronic obstructive pulmonary disease (COPD). Sometimes, these complications can require emergency medical care and may be life threatening.  CAUSES Almost all URIs are caused by viruses. A virus is a type of germ and can spread from one person to another.  RISKS FACTORS You may be at risk for a URI if:   You smoke.   You have chronic heart or lung disease.  You have a weakened defense (immune) system.   You are very young or very old.   You have nasal allergies or asthma.  You work in crowded or poorly ventilated areas.  You work in health care facilities or schools. SIGNS AND SYMPTOMS  Symptoms typically develop 2-3 days after you come in contact with a  cold virus. Most viral URIs last 7-14 days.  Symptoms may include:   Runny or stuffy (congested) nose.   Sneezing.   Cough.   Sore throat.   Headache.   Fatigue.   Fever.   Loss of appetite.   Pain in your forehead, behind your eyes, and over your cheekbones (sinus pain).  Muscle aches.  DIAGNOSIS  Your health care provider may diagnose a URI by:  Physical exam.  Tests to check that your symptoms are not due to another condition such as:  Strep throat.  Sinusitis.  Pneumonia.  Asthma. TREATMENT  A URI goes away on its own with time. It cannot be cured with medicines, but medicines may be prescribed or recommended to relieve symptoms. Medicines may help:  Reduce your fever.  Reduce your cough.  Relieve nasal congestion. HOME CARE INSTRUCTIONS   Take medicines only as directed by your health care provider.   Gargle warm saltwater or take cough drops to comfort your throat as directed by your health care provider.  Use a warm mist humidifier or inhale steam from a shower to increase air moisture. This may make it easier to breathe.  Drink enough fluid to keep your urine clear or pale yellow.   Eat soups and other clear broths and maintain good nutrition.   Rest as needed.   Return to work when your temperature has returned to normal or as your health care provider advises. You may need to stay home longer to avoid infecting others. You can also use  a face mask and careful hand washing to prevent spread of the virus.  Increase the usage of your inhaler if you have asthma.   Do not use any tobacco products, including cigarettes, chewing tobacco, or electronic cigarettes. If you need help quitting, ask your health care provider. PREVENTION  The best way to protect yourself from getting a cold is to practice good hygiene.   Avoid oral or hand contact with people with cold symptoms.   Wash your hands often if contact occurs.  There is no  clear evidence that vitamin C, vitamin E, echinacea, or exercise reduces the chance of developing a cold. However, it is always recommended to get plenty of rest, exercise, and practice good nutrition.  SEEK MEDICAL CARE IF:   You are getting worse rather than better.   Your symptoms are not controlled by medicine.   You have chills.  You have worsening shortness of breath.  You have brown or red mucus.  You have yellow or brown nasal discharge.  You have pain in your face, especially when you bend forward.  You have a fever.  You have swollen neck glands.  You have pain while swallowing.  You have white areas in the back of your throat. SEEK IMMEDIATE MEDICAL CARE IF:   You have severe or persistent:  Headache.  Ear pain.  Sinus pain.  Chest pain.  You have chronic lung disease and any of the following:  Wheezing.  Prolonged cough.  Coughing up blood.  A change in your usual mucus.  You have a stiff neck.  You have changes in your:  Vision.  Hearing.  Thinking.  Mood. MAKE SURE YOU:   Understand these instructions.  Will watch your condition.  Will get help right away if you are not doing well or get worse.   This information is not intended to replace advice given to you by your health care provider. Make sure you discuss any questions you have with your health care provider.   Document Released: 02/04/2001 Document Revised: 12/26/2014 Document Reviewed: 11/16/2013 Elsevier Interactive Patient Education [employer]2016 [employer] Inc[employer]

## 2015-11-19 ENCOUNTER — Encounter: Payer: Self-pay | Admitting: Family Medicine

## 2015-11-20 ENCOUNTER — Emergency Department (HOSPITAL_COMMUNITY)
Admission: EM | Admit: 2015-11-20 | Discharge: 2015-11-21 | Disposition: A | Discharge status: Home / Self Care | Payer: Medicare Other | Attending: Emergency Medicine | Admitting: Emergency Medicine

## 2015-11-20 ENCOUNTER — Emergency Department (HOSPITAL_COMMUNITY): Payer: Medicare Other

## 2015-11-20 ENCOUNTER — Encounter: Payer: Self-pay | Admitting: Family Medicine

## 2015-11-20 ENCOUNTER — Encounter (HOSPITAL_COMMUNITY): Payer: Self-pay

## 2015-11-20 ENCOUNTER — Ambulatory Visit (INDEPENDENT_AMBULATORY_CARE_PROVIDER_SITE_OTHER): Payer: Medicare Other | Admitting: Family Medicine

## 2015-11-20 VITALS — BP 120/68 | HR 87 | Temp 98.3°F | Wt 172.0 lb

## 2015-11-20 DIAGNOSIS — N400 Enlarged prostate without lower urinary tract symptoms: Secondary | ICD-10-CM | POA: Insufficient documentation

## 2015-11-20 DIAGNOSIS — R140 Abdominal distension (gaseous): Secondary | ICD-10-CM | POA: Diagnosis not present

## 2015-11-20 DIAGNOSIS — I10 Essential (primary) hypertension: Secondary | ICD-10-CM | POA: Diagnosis not present

## 2015-11-20 DIAGNOSIS — R109 Unspecified abdominal pain: Secondary | ICD-10-CM

## 2015-11-20 DIAGNOSIS — Z79899 Other long term (current) drug therapy: Secondary | ICD-10-CM | POA: Diagnosis not present

## 2015-11-20 DIAGNOSIS — R1084 Generalized abdominal pain: Secondary | ICD-10-CM

## 2015-11-20 DIAGNOSIS — Z7982 Long term (current) use of aspirin: Secondary | ICD-10-CM | POA: Insufficient documentation

## 2015-11-20 DIAGNOSIS — Z872 Personal history of diseases of the skin and subcutaneous tissue: Secondary | ICD-10-CM | POA: Diagnosis not present

## 2015-11-20 DIAGNOSIS — Z8659 Personal history of other mental and behavioral disorders: Secondary | ICD-10-CM | POA: Insufficient documentation

## 2015-11-20 DIAGNOSIS — E785 Hyperlipidemia, unspecified: Secondary | ICD-10-CM | POA: Insufficient documentation

## 2015-11-20 DIAGNOSIS — K5900 Constipation, unspecified: Secondary | ICD-10-CM | POA: Diagnosis not present

## 2015-11-20 DIAGNOSIS — N200 Calculus of kidney: Secondary | ICD-10-CM | POA: Diagnosis not present

## 2015-11-20 DIAGNOSIS — N201 Calculus of ureter: Secondary | ICD-10-CM | POA: Diagnosis not present

## 2015-11-20 LAB — URINALYSIS, ROUTINE W REFLEX MICROSCOPIC
BILIRUBIN URINE: NEGATIVE
GLUCOSE, UA: NEGATIVE mg/dL
HGB URINE DIPSTICK: NEGATIVE
KETONES UR: NEGATIVE mg/dL
LEUKOCYTES UA: NEGATIVE
Nitrite: NEGATIVE
PH: 5.5 (ref 5.0–8.0)
PROTEIN: NEGATIVE mg/dL
Specific Gravity, Urine: 1.026 (ref 1.005–1.030)

## 2015-11-20 LAB — BASIC METABOLIC PANEL
ANION GAP: 13 (ref 5–15)
BUN: 25 mg/dL — AB (ref 6–20)
CHLORIDE: 97 mmol/L — AB (ref 101–111)
CO2: 24 mmol/L (ref 22–32)
CREATININE: 2.2 mg/dL — AB (ref 0.61–1.24)
Calcium: 9.2 mg/dL (ref 8.9–10.3)
GFR calc Af Amer: 34 mL/min — ABNORMAL LOW (ref >60)
GFR calc non Af Amer: 29 mL/min — ABNORMAL LOW (ref >60)
Glucose, Bld: 124 mg/dL — ABNORMAL HIGH (ref 65–99)
POTASSIUM: 4.4 mmol/L (ref 3.5–5.1)
SODIUM: 134 mmol/L — AB (ref 135–145)

## 2015-11-20 LAB — CBC WITH DIFFERENTIAL/PLATELET
BASOS ABS: 0 10*3/uL (ref 0.0–0.1)
Basophils Relative: 0 %
EOS ABS: 0 10*3/uL (ref 0.0–0.7)
EOS PCT: 0 %
HCT: 38.5 % — ABNORMAL LOW (ref 39.0–52.0)
Hemoglobin: 13.7 g/dL (ref 13.0–17.0)
Lymphocytes Relative: 7 %
Lymphs Abs: 1.2 10*3/uL (ref 0.7–4.0)
MCH: 31.1 pg (ref 26.0–34.0)
MCHC: 35.6 g/dL (ref 30.0–36.0)
MCV: 87.3 fL (ref 78.0–100.0)
MONO ABS: 1.9 10*3/uL — AB (ref 0.1–1.0)
Monocytes Relative: 11 %
Neutro Abs: 13.9 10*3/uL — ABNORMAL HIGH (ref 1.7–7.7)
Neutrophils Relative %: 82 %
PLATELETS: 292 10*3/uL (ref 150–400)
RBC: 4.41 MIL/uL (ref 4.22–5.81)
RDW: 12.9 % (ref 11.5–15.5)
WBC: 17.1 10*3/uL — AB (ref 4.0–10.5)

## 2015-11-20 MED ORDER — SODIUM CHLORIDE 0.9 % IV BOLUS (SEPSIS)
1000.0000 mL | Freq: Once | INTRAVENOUS | Status: AC
  Administered 2015-11-20: 1000 mL via INTRAVENOUS

## 2015-11-20 NOTE — ED Provider Notes (Signed)
CSN: [id number]     Arrival date & time 11/20/15  1753 History   First MD Initiated Contact with Patient 11/20/15 1903     Chief Complaint  Patient presents with  . Constipation   HPI  Robert Gaines is a 70 year old male with PMHx of HTN, HLD, depression and BPH presenting with constipation. Patient was seen at his PCPs office earlier today for this complaint and directed to come to the emergency department for imaging to rule out bowel obstruction. Patient has not had a bowel movement in 5 days. He states he typically has one every 1-2 days. He does endorse straining an attempt to have a bowel movement with no stool passage. He complains of intermittent abdominal pressure. He states this is worse in the evening. He denies pain; states it is simply uncomfortable. He also notes that he has not been passing gas. He denies any history of abdominal surgeries. He notes decreased appetite over the past week. He has been able to drink fluids but states he gets somewhat nauseous with solids. He has tried MiraLAX and mag citrate without relief of his symptoms. He does note that he recently had an upper respiratory viral infection but those symptoms resolved. He states he is currently not experiencing any abdominal pain, abdominal pressure or nausea. He denies any episodes of vomiting. He states his abdomen does appear to be slightly more distended than usual. He has no other complaints today. Not use narcotic medications. No new changes to medications or diet.  Past Medical History  Diagnosis Date  . Rosacea   . Depression   . Hyperlipidemia   . Hypertension   . BPH associated with nocturia 10/03/2014  . PSA, INCREASED 04/16/2010    History prostatitis x 2, # decreased with ciprofloxacin     Past Surgical History  Procedure Laterality Date  . Flexible sigmoidoscopy    . Wisdom tooth extraction     Family History  Problem Relation Age of Onset  . Heart disease Father     MI 53, smoker  . Colon cancer  Neg Hx   . Esophageal cancer Neg Hx   . Rectal cancer Neg Hx   . Stomach cancer Neg Hx    Social History  Substance Use Topics  . Smoking status: Never Smoker   . Smokeless tobacco: Never Used  . Alcohol Use: 0.0 oz/week     Comment: occas    Review of Systems  Constitutional: Positive for appetite change.  Gastrointestinal: Positive for nausea, constipation and abdominal distention.  All other systems reviewed and are negative.     Allergies  Review of patient's allergies indicates no known allergies.  Home Medications   Prior to Admission medications   Medication Sig Start Date End Date Taking? Authorizing Provider  allopurinol (ZYLOPRIM) 100 MG tablet Take two pills daily. Patient taking differently: Take 200 mg by mouth daily.  04/10/15  Yes Shelva Majestic, MD  aspirin 81 MG tablet Take 81 mg by mouth daily.     Yes Historical Provider, MD  atorvastatin (LIPITOR) 40 MG tablet Take 1 tablet (40 mg total) by mouth daily. 12/12/14  Yes Shelva Majestic, MD  Calcium Carbonate-Vitamin D (CALCIUM-VITAMIN D) 500-200 MG-UNIT tablet Take 1 tablet by mouth daily.   Yes Historical Provider, MD  colchicine 0.6 MG tablet Take 1 tablet (0.6 mg total) by mouth daily. If not on daily and  If flare, can use 2 tabs followed by repeat dose in 2 hours  ifnot resolved 01/16/15  Yes Shelva Majestic, MD  folic acid (FOLVITE) 1 MG tablet Take 1 mg by mouth daily.     Yes Historical Provider, MD  glucosamine-chondroitin 500-400 MG tablet Take 1 tablet by mouth 3 (three) times daily.   Yes Historical Provider, MD  lisinopril (PRINIVIL,ZESTRIL) 10 MG tablet TAKE 1/2 TABLET BY MOUTH DAILY 09/17/15  Yes Shelva Majestic, MD  loratadine-pseudoephedrine (KLS ALLERCLEAR D-24HR) 10-240 MG per 24 hr tablet Take 1 tablet by mouth daily. 01/29/15  Yes Shelva Majestic, MD  Multiple Vitamins-Minerals (CENTRUM SILVER PO) Take 1 tablet by mouth daily.    Yes Historical Provider, MD  pyridoxine (B-6) 200 MG tablet  Take 200 mg by mouth daily.     Yes Historical Provider, MD  ondansetron (ZOFRAN) 4 MG tablet Take 1 tablet (4 mg total) by mouth every 6 (six) hours. 11/21/15   Robert Meriweather, PA-C  oxyCODONE-acetaminophen (PERCOCET/ROXICET) 5-325 MG tablet Take 1 tablet by mouth every 4 (four) hours as needed for severe pain. 11/21/15   Robert Mich, PA-C  tamsulosin (FLOMAX) 0.4 MG CAPS capsule Take 1 capsule (0.4 mg total) by mouth daily. 11/21/15   Robert Karapetyan, PA-C   BP 128/86 mmHg  Pulse 89  Temp(Src) 98.2 F (36.8 C) (Oral)  Resp 18  SpO2 96% Physical Exam  Constitutional: He appears well-developed and well-nourished. No distress.  Nontoxic-appearing  HENT:  Head: Normocephalic and atraumatic.  Eyes: Conjunctivae are normal. Right eye exhibits no discharge. Left eye exhibits no discharge. No scleral icterus.  Neck: Normal range of motion.  Cardiovascular: Normal rate and regular rhythm.   Pulmonary/Chest: Effort normal and breath sounds normal. No respiratory distress.  Abdominal: Soft. Bowel sounds are normal. He exhibits distension. There is no tenderness. There is no rebound and no guarding.  Normoactive bowel sounds. Abdomen is soft and mildly distended. No tenderness or peritoneal signs. No CVA tenderness  Musculoskeletal: Normal range of motion.  Neurological: He is alert. Coordination normal.  Skin: Skin is warm and dry.  Psychiatric: He has a normal mood and affect. His behavior is normal.  Nursing note and vitals reviewed.   ED Course  Procedures (including critical care time) Labs Review Labs Reviewed  CBC WITH DIFFERENTIAL/PLATELET - Abnormal; Notable for the following:    WBC 17.1 (*)    HCT 38.5 (*)    Neutro Abs 13.9 (*)    Monocytes Absolute 1.9 (*)    All other components within normal limits  BASIC METABOLIC PANEL - Abnormal; Notable for the following:    Sodium 134 (*)    Chloride 97 (*)    Glucose, Bld 124 (*)    BUN 25 (*)    Creatinine, Ser 2.20 (*)    GFR  calc non Af Amer 29 (*)    GFR calc Af Amer 34 (*)    All other components within normal limits  URINALYSIS, ROUTINE W REFLEX MICROSCOPIC (NOT AT [facility]) - Abnormal; Notable for the following:    Color, Urine AMBER (*)    All other components within normal limits    Imaging Review Ct Abdomen Pelvis Wo Contrast  11/20/2015  CLINICAL DATA:  Abdominal pain and constipation. EXAM: CT ABDOMEN AND PELVIS WITHOUT CONTRAST TECHNIQUE: Multidetector CT imaging of the abdomen and pelvis was performed following the standard protocol without IV contrast. COMPARISON:  04/15/2006 MRI angiogram of the abdomen. Abdominal radiographs from earlier today. FINDINGS: Lower chest: Left lower lobe 5 mm solid pulmonary nodule (series 6/image 3).  Coronary atherosclerosis. Hepatobiliary: Normal liver with no liver mass. Normal gallbladder with no radiopaque cholelithiasis. No biliary ductal dilatation. Pancreas: Normal, with no mass or duct dilation. Spleen: Normal size. No mass. Adrenals/Urinary Tract: Normal adrenals. Obstructing 6 x 6 mm left ureteral stone at the level of the proximal left pelvic ureter, with mild to moderate left hydroureteronephrosis with left perinephric fat stranding, mild enlargement of the left kidney, left periureteric fat stranding and urothelial wall thickening in the left renal collecting system and dilated proximal left ureter. No additional renal or ureteral stones. Malrotated right kidney. No right hydronephrosis. Simple 1.4 cm upper right renal cyst. Simple exophytic 6.7 cm lower right renal cyst. No contour deforming left renal mass. Normal caliber right ureter. Collapsed and grossly normal bladder. Stomach/Bowel: Grossly normal stomach. The proximal small bowel is top-normal caliber. The distal small bowel is collapsed. No focal small bowel caliber transition. No small bowel wall thickening. Appendix is not discretely visualized. There are fluid levels throughout the cecum, ascending colon,  descending colon, sigmoid colon and rectum. There is mild gaseous distention of the transverse colon. No large bowel wall thickening or pericolonic fat stranding. Vascular/Lymphatic: Atherosclerotic nonaneurysmal abdominal aorta. No pathologically enlarged lymph nodes in the abdomen or pelvis. Reproductive: Mild prostatomegaly with mass effect on the bladder base by the enlarged central lobe of the prostate. Other: No pneumoperitoneum, ascites or focal fluid collection. Musculoskeletal: No aggressive appearing focal osseous lesions. Mild degenerative changes in the visualized thoracolumbar spine. IMPRESSION: 1. Obstructing 6 mm left ureteral stone at the level of the proximal pelvic segment of the left ureter, with mild to moderate left hydroureteronephrosis. Prominent left periureteric fat stranding and urothelial wall thickening in the left renal collecting system and proximal left ureter, cannot exclude superimposed left urinary tract infection. 2. Top-normal caliber proximal small bowel. Collapsed distal small bowel. No focal small bowel caliber transition. Fluid levels throughout the colon. Mild gaseous distention of the transverse colon. Findings suggest a mild adynamic ileus of the small and large bowel. 3. Left lower lobe 5 mm pulmonary nodule. No follow-up needed if patient is low-risk. Non-contrast chest CT can be considered in 12 months if patient is high-risk. This recommendation follows the consensus statement: Guidelines for Management of Incidental Pulmonary Nodules Detected on CT Images:From the Fleischner Society 2017; published online before print (10.1148/radiol.0981191478). 4. Additional findings include coronary atherosclerosis and mild prostatomegaly. Electronically Signed   By: Delbert Phenix M.D.   On: 11/20/2015 23:39   Dg Abd 1 View  11/20/2015  CLINICAL DATA:  70 year old male with history of constipation for the past 5 days. Pressure in the abdomen. EXAM: ABDOMEN - 1 VIEW COMPARISON:  No  priors. FINDINGS: Mild gaseous distention throughout the transverse colon. Gas and stool are noted to the level of the distal rectum. There are several nondilated loops of gas-filled small bowel in the central abdomen. No pathologic dilatation of small bowel. No pneumoperitoneum. IMPRESSION: 1. Nonspecific, nonobstructive bowel gas pattern, as above. 2. No pneumoperitoneum. Electronically Signed   By: Trudie Reed M.D.   On: 11/20/2015 19:55   I have personally reviewed and evaluated these images and lab results as part of my medical decision-making.   EKG Interpretation None      MDM   Final diagnoses:  Constipation  Abdominal pain  Nephrolithiasis   Pt presenting with constipation and abdominal distension x 5 days. Associated symptoms include postprandial nausea. Seen by PCP for this complaint and directed to ED for abdominal work up. Afebrile  and nontoxic appearing. Abdomen is soft, non-tender without peritoneal sign. No CVA tenderness. Leukocytosis with elevated creatinine from baseline. Urinalysis without hematuria or infection. Pt has been diagnosed with a Kidney Stone via CT. No constipation noted on CT. Discussed with patient who admits to history of two kidney stones in the past. States this pain is somewhat similar but much less severe. Dicussed pt with Dr. Rubin PayorPickering who recommends close outpatient follow up with urology. Pt will be discharged home with pain medications, nausea medication and instructed to strain their urine until the stone passes. Given referral information for urology and pt is to make appt tomorrow. Return precautions given in discharge paperwork and discussed with pt at bedside. Pt stable for discharge      Robert HeimlichStevi Niara Bunker, PA-C 11/21/15 0037  Benjiman CoreNathan Pickering, MD 11/22/15 [phone]

## 2015-11-20 NOTE — ED Notes (Signed)
Provided a urinal for urine sample.  

## 2015-11-20 NOTE — ED Notes (Signed)
Pt here from MD office.  No bm x 5 days.  ? Small bowel obstruction per MD.  Pt has recently had flu and decreased intake.  Pt is distended without passing gas.  No recent narcotic use.

## 2015-11-20 NOTE — Progress Notes (Signed)
Tana Conch, MD  Subjective:  Robert Gaines is a 70 y.o. year old very pleasant male patient who presents for/with See problem oriented charting ROS- see ros below  Past Medical History-  Patient Active Problem List   Diagnosis Date Noted  . Hyperglycemia 10/03/2014    Priority: Medium  . BPH associated with nocturia 10/03/2014    Priority: Medium  . Gout 06/21/2009    Priority: Medium  . Chronic kidney disease, stage III (moderate) 04/05/2008    Priority: Medium  . Hypertension 05/31/2007    Priority: Medium  . Hyperlipidemia 05/24/2007    Priority: Medium  . Seasonal allergies 05/11/2014    Priority: Low  . PSA, INCREASED 04/16/2010    Priority: Low  . ACNE ROSACEA 11/16/2008    Priority: Low  . DEPRESSION 05/24/2007    Priority: Low    Medications- reviewed and updated Current Outpatient Prescriptions  Medication Sig Dispense Refill  . allopurinol (ZYLOPRIM) 100 MG tablet Take two pills daily. 60 tablet 6  . aspirin 81 MG tablet Take 81 mg by mouth daily.      Marland Kitchen atorvastatin (LIPITOR) 40 MG tablet Take 1 tablet (40 mg total) by mouth daily. 90 tablet 3  . Calcium Carbonate-Vitamin D (CALCIUM-VITAMIN D) 500-200 MG-UNIT tablet Take 1 tablet by mouth daily.    . folic acid (FOLVITE) 1 MG tablet Take 1 mg by mouth daily.      Marland Kitchen glucosamine-chondroitin 500-400 MG tablet Take 1 tablet by mouth 3 (three) times daily.    Marland Kitchen lisinopril (PRINIVIL,ZESTRIL) 10 MG tablet TAKE 1/2 TABLET BY MOUTH DAILY 45 tablet 3  . loratadine-pseudoephedrine (KLS ALLERCLEAR D-24HR) 10-240 MG per 24 hr tablet Take 1 tablet by mouth daily. 30 tablet 11  . Multiple Vitamins-Minerals (CENTRUM SILVER PO) Take by mouth.    . pyridoxine (B-6) 200 MG tablet Take 200 mg by mouth daily.      . colchicine 0.6 MG tablet Take 1 tablet (0.6 mg total) by mouth daily. If not on daily and  If flare, can use 2 tabs followed by repeat dose in 2 hours ifnot resolved (Patient not taking: Reported on 11/20/2015) 60  tablet 2   No current facility-administered medications for this visit.    Objective: BP 120/68 mmHg  Pulse 87  Temp(Src) 98.3 F (36.8 C)  Wt 172 lb (78.019 kg) Gen: NAD, appears slightly uncomfortable in chair CV: RRR no murmurs rubs or gallops Lungs: CTAB no crackles, wheeze, rhonchi Abdomen: distended and firm abdomen. Mild to moderate pain to palpation. Hypoactive bowel sounds.   No rebound or guarding.  Ext: no edema Skin: warm, dry, no rash over abdomen Neuro: grossly normal, moves all extremities  Assessment/Plan:  Abdominal distension/diffuse abdominal pain S:No bowel movement in 5 days. Took miralax 11/19/15 one capful. Went to drug store and tried magnesium citrate late afternoon. Took another dose of miralax this morning.  Feels tired, lethargic, food doesn't taste as good, less appetite. Nausea with eating lights amount of food. Is able to drink fluids ok. Feels bloating in abdomen and almost feels firm. Rates abdominal pressure in lower abdomen as mild most times but other times flares to 7/10 times. Not passing any gas.  ROS- no fever, chills. No diarrhea in recent memory. No vomiting but has severely decreased appetite. Feels very distended.  A/P: 70 year old male with no history abdominal surgery presenting with no bowel movement in 5 days and passing no gas with progressively distended abdomen and no stool  noted in stool vault. I am concerned about small bowel obstruction and have sent to ED for x-ray at minimum but may need CT especially given no obvious source for obstruction in history such as adhesions. May need NG tube and bowel rest.   The duration of face-to-face time during this visit was 25 minutes. Greater than 50% of this time was spent in counseling, explanation of diagnosis, planning of further management, and/or coordination of care.

## 2015-11-20 NOTE — Patient Instructions (Signed)
I am concerned about small bowel obstruction. You will need at least an x-ray of the abdomen but may need CT of the abdomen. The reason I am concerned about this is the tightness of your abdomen and no bowel movement or gas in 5 days. This may indicate a blockage more concerning than just constipation. I did not feel any stool burden on rectal exam as potential cause.   I am ok with you driving home for wife to drive you to emergency room but I want you to go immediately to the emergency room.

## 2015-11-21 ENCOUNTER — Telehealth: Payer: Self-pay | Admitting: Family Medicine

## 2015-11-21 MED ORDER — TAMSULOSIN HCL 0.4 MG PO CAPS: 0.4000 mg | ORAL_CAPSULE | Freq: Every day | ORAL | Status: DC

## 2015-11-21 MED ORDER — ONDANSETRON HCL 4 MG PO TABS: 4.0000 mg | ORAL_TABLET | Freq: Four times a day (QID) | ORAL | Status: DC

## 2015-11-21 MED ORDER — OXYCODONE-ACETAMINOPHEN 5-325 MG PO TABS: 1.0000 | ORAL_TABLET | ORAL | Status: DC | PRN

## 2015-11-21 NOTE — Telephone Encounter (Signed)
Spoke with patient post ER. Had large BM finally. In addition- found to have kidney stone which could have been source of pain and seeing urology today likely.

## 2015-11-21 NOTE — ED Notes (Signed)
Provided a strainer x 2.

## 2015-11-21 NOTE — Discharge Instructions (Signed)
Schedule a follow up appointment with Dr. Patsi Searsannenbaum for a visit as soon as possible. Strain all your urine until the stone passes.   Kidney Stones Kidney stones (urolithiasis) are deposits that form inside your kidneys. The intense pain is caused by the stone moving through the urinary tract. When the stone moves, the ureter goes into spasm around the stone. The stone is usually passed in the urine.  CAUSES   A disorder that makes certain neck glands produce too much parathyroid hormone (primary hyperparathyroidism).  A buildup of uric acid crystals, similar to gout in your joints.  Narrowing (stricture) of the ureter.  A kidney obstruction present at birth (congenital obstruction).  Previous surgery on the kidney or ureters.  Numerous kidney infections. SYMPTOMS   Feeling sick to your stomach (nauseous).  Throwing up (vomiting).  Blood in the urine (hematuria).  Pain that usually spreads (radiates) to the groin.  Frequency or urgency of urination. DIAGNOSIS   Taking a history and physical exam.  Blood or urine tests.  CT scan.  Occasionally, an examination of the inside of the urinary bladder (cystoscopy) is performed. TREATMENT   Observation.  Increasing your fluid intake.  Extracorporeal shock wave lithotripsy--This is a noninvasive procedure that uses shock waves to break up kidney stones.  Surgery may be needed if you have severe pain or persistent obstruction. There are various surgical procedures. Most of the procedures are performed with the use of small instruments. Only small incisions are needed to accommodate these instruments, so recovery time is minimized. The size, location, and chemical composition are all important variables that will determine the proper choice of action for you. Talk to your health care provider to better understand your situation so that you will minimize the risk of injury to yourself and your kidney.  HOME CARE INSTRUCTIONS    Drink enough water and fluids to keep your urine clear or pale yellow. This will help you to pass the stone or stone fragments.  Strain all urine through the provided strainer. Keep all particulate matter and stones for your health care provider to see. The stone causing the pain may be as small as a grain of salt. It is very important to use the strainer each and every time you pass your urine. The collection of your stone will allow your health care provider to analyze it and verify that a stone has actually passed. The stone analysis will often identify what you can do to reduce the incidence of recurrences.  Only take over-the-counter or prescription medicines for pain, discomfort, or fever as directed by your health care provider.  Keep all follow-up visits as told by your health care provider. This is important.  Get follow-up X-rays if required. The absence of pain does not always mean that the stone has passed. It may have only stopped moving. If the urine remains completely obstructed, it can cause loss of kidney function or even complete destruction of the kidney. It is your responsibility to make sure X-rays and follow-ups are completed. Ultrasounds of the kidney can show blockages and the status of the kidney. Ultrasounds are not associated with any radiation and can be performed easily in a matter of minutes.  Make changes to your daily diet as told by your health care provider. You may be told to:  Limit the amount of salt that you eat.  Eat 5 or more servings of fruits and vegetables each day.  Limit the amount of meat, poultry, fish, and eggs  that you eat.  Collect a 24-hour urine sample as told by your health care provider.You may need to collect another urine sample every 6-12 months. SEEK MEDICAL CARE IF:  You experience pain that is progressive and unresponsive to any pain medicine you have been prescribed. SEEK IMMEDIATE MEDICAL CARE IF:   Pain cannot be controlled  with the prescribed medicine.  You have a fever or shaking chills.  The severity or intensity of pain increases over 18 hours and is not relieved by pain medicine.  You develop a new onset of abdominal pain.  You feel faint or pass out.  You are unable to urinate.   This information is not intended to replace advice given to you by your health care provider. Make sure you discuss any questions you have with your health care provider.   Document Released: 08/11/2005 Document Revised: 05/02/2015 Document Reviewed: 01/12/2013 Elsevier Interactive Patient Education [employer].

## 2015-11-22 DIAGNOSIS — Z125 Encounter for screening for malignant neoplasm of prostate: Secondary | ICD-10-CM | POA: Diagnosis not present

## 2015-11-22 DIAGNOSIS — N200 Calculus of kidney: Secondary | ICD-10-CM | POA: Diagnosis not present

## 2015-11-22 DIAGNOSIS — R93421 Abnormal radiologic findings on diagnostic imaging of right kidney: Secondary | ICD-10-CM | POA: Diagnosis not present

## 2015-11-22 DIAGNOSIS — Z0000 Encounter for general adult medical examination without abnormal findings: Secondary | ICD-10-CM | POA: Diagnosis not present

## 2015-11-22 DIAGNOSIS — N179 Acute kidney failure, unspecified: Secondary | ICD-10-CM | POA: Diagnosis not present

## 2015-11-22 DIAGNOSIS — N281 Cyst of kidney, acquired: Secondary | ICD-10-CM | POA: Diagnosis not present

## 2015-11-26 ENCOUNTER — Other Ambulatory Visit: Payer: Self-pay | Admitting: Urology

## 2015-11-27 ENCOUNTER — Encounter (HOSPITAL_COMMUNITY): Payer: Self-pay | Admitting: *Deleted

## 2015-11-29 ENCOUNTER — Encounter (HOSPITAL_COMMUNITY): Payer: Self-pay | Admitting: *Deleted

## 2015-11-29 ENCOUNTER — Ambulatory Visit (HOSPITAL_COMMUNITY): Payer: Medicare Other

## 2015-11-29 ENCOUNTER — Ambulatory Visit (HOSPITAL_COMMUNITY)
Admission: RE | Admit: 2015-11-29 | Discharge: 2015-11-29 | Disposition: A | Discharge status: Home / Self Care | Payer: Medicare Other | Source: Ambulatory Visit | Attending: Urology | Admitting: Urology

## 2015-11-29 ENCOUNTER — Encounter (HOSPITAL_COMMUNITY)
Admission: RE | Disposition: A | Discharge status: Home / Self Care | Payer: Self-pay | Source: Ambulatory Visit | Attending: Urology

## 2015-11-29 DIAGNOSIS — N281 Cyst of kidney, acquired: Secondary | ICD-10-CM | POA: Diagnosis not present

## 2015-11-29 DIAGNOSIS — M109 Gout, unspecified: Secondary | ICD-10-CM | POA: Diagnosis not present

## 2015-11-29 DIAGNOSIS — Q632 Ectopic kidney: Secondary | ICD-10-CM | POA: Insufficient documentation

## 2015-11-29 DIAGNOSIS — N201 Calculus of ureter: Secondary | ICD-10-CM | POA: Diagnosis present

## 2015-11-29 DIAGNOSIS — I10 Essential (primary) hypertension: Secondary | ICD-10-CM | POA: Diagnosis not present

## 2015-11-29 SURGERY — LITHOTRIPSY, ESWL
Anesthesia: LOCAL | Laterality: Left

## 2015-11-29 MED ORDER — TAMSULOSIN HCL 0.4 MG PO CAPS: 0.4000 mg | ORAL_CAPSULE | Freq: Every day | ORAL | Status: DC

## 2015-11-29 MED ORDER — SODIUM CHLORIDE 0.9 % IV SOLN
INTRAVENOUS | Status: DC
  Administered 2015-11-29: 09:00:00 via INTRAVENOUS

## 2015-11-29 MED ORDER — CIPROFLOXACIN IN D5W 400 MG/200ML IV SOLN
400.0000 mg | INTRAVENOUS | Status: AC
  Administered 2015-11-29: 400 mg via INTRAVENOUS
  Filled 2015-11-29: qty 200

## 2015-11-29 MED ORDER — DIAZEPAM 5 MG PO TABS
10.0000 mg | ORAL_TABLET | ORAL | Status: AC
  Administered 2015-11-29: 10 mg via ORAL
  Filled 2015-11-29: qty 2

## 2015-11-29 MED ORDER — DIPHENHYDRAMINE HCL 25 MG PO CAPS
25.0000 mg | ORAL_CAPSULE | ORAL | Status: AC
  Administered 2015-11-29: 25 mg via ORAL
  Filled 2015-11-29: qty 1

## 2015-11-29 NOTE — H&P (Signed)
Dear Dr Robert Gaines, please find the attached office note with summary of clinical findings and plan.   Chief Complaint Consultation for nephrolithiasis / acute renal failure, bilateral non-complex renal cysts, Rt renal malrotation, prostate screening   History of Present Illness 1 - Nephrolithiasis / acute renal failure - left 86m ureteral stone in distal 1/3 of ureter by ER CT 10/2015 on eval abdominal pain and consipation. Stone is solitary, 770m 10cm SSD, 900 HU and at level of S2 foramina by scout images. Cr at time 2.2 up from abseline 1.5, no hyperkalemia, had poor PO intake prior.    2 - Bilateral non-complex renal cysts - RT 1.4cm, Lt 7cm non-complex cysts incidetnal on ER CT 10/2015. NO mass effect or nodules.    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 NO hydro / parenchymal thinning.     4 - Prostate screening - No FHX prostate cancer. History of up and down PSA's.   2012 PSA 1.65  2015 PSA 6.66  2016 PSA 2.6  2017 PSA 4.15 --> recheck pending through PCP / DRE 40gm smooth. Vol 6562my CT ellipsoid calculation.     PMH sig for TNA, HTN. NO CV diseas. NO strong blood thinners. His PCP is Ste[facility]ret Reddish with [facility]  Today "AllYings seen in consultation for above. [facility] Dr. HunYong Channele is presently on trial of passage with pain meds and tamsulosin and reports no interval passage, still having quite a bit of pain. NO fevers / emesis.    Past Medical History Problems  1. History of gout (Z87.39) 2. History of hypertension (Z86.79)  Surgical History Problems  1. History of No Surgical Problems  Current Meds 1. Lipitor 40 MG Oral Tablet;  Therapy: (Recorded:60Mar2017) to Recorded 2. Prinivil 5 MG Oral Tablet;  Therapy: (Recorded:60Mar2017) to Recorded 3. Zyloprim 100 MG Oral Tablet;  Therapy: (Recorded:60Mar2017) to Recorded  Allergies Medication  1. No Known  Drug Allergies  Family History Problems  1. Family history of Death of family member : Mother, Father 2. Family history of diabetes mellitus (Z83.3) : Father  Social History Problems    Denied: History of Alcohol use   Caffeine use (F15.90)   Married   Never a smoker   Number of children   Retired  Review of Systems Genitourinary, constitutional, skin, eye, otolaryngeal, hematologic/lymphatic, cardiovascular, pulmonary, endocrine, musculoskeletal, gastrointestinal, neurological and psychiatric system(s) were reviewed and pertinent findings if present are noted and are otherwise negative.  Gastrointestinal: flank pain.    Vitals Vital Signs [Data Includes: Last 1 Day]  Recorded: 60M17EYC1448:05PM  Height: 5 ft 7 in Weight: 172 lb  BMI Calculated: 26.94 BSA Calculated: 1.9 Blood Pressure: 131 / 74 Temperature: 97.5 F Heart Rate: 88  Physical Exam Constitutional: Well nourished and well developed . No acute distress.  ENT:. The ears and nose are normal in appearance.  Neck: The appearance of the neck is normal and no neck mass is present.  Pulmonary: No respiratory distress and normal respiratory rhythm and effort.  Cardiovascular: Heart rate and rhythm are normal . No peripheral edema.  Abdomen: The abdomen is soft and nontender. No masses are palpated. No CVA tenderness. No hernias are palpable. No hepatosplenomegaly noted.  Rectal: Rectal exam demonstrates normal sphincter tone, no tenderness and no masses. 40gm smooth. The prostate has no nodularity and is not tender. The left seminal vesicle  is nonpalpable. The right seminal vesicle is nonpalpable. The perineum is normal on inspection.  Genitourinary: Examination of the penis demonstrates no discharge, no masses, no lesions and a normal meatus. The scrotum is without lesions. The right epididymis is palpably normal and non-tender. The left epididymis is palpably normal and non-tender. The right testis is non-tender and  without masses. The left testis is non-tender and without masses.  Lymphatics: The femoral and inguinal nodes are not enlarged or tender.  Skin: Normal skin turgor, no visible rash and no visible skin lesions.  Neuro/Psych:. Mood and affect are appropriate.    Results/Data Urine [Data Includes: Last 1 Day]   [id number]  COLOR AMBER   APPEARANCE CLEAR   SPECIFIC GRAVITY 1.020   pH 6.0   GLUCOSE NEGATIVE   BILIRUBIN NEGATIVE   KETONE TRACE   BLOOD NEGATIVE   PROTEIN TRACE   NITRITE NEGATIVE   LEUKOCYTE ESTERASE NEGATIVE    Procedure   KUB:    Study: Abdominal Anterior-Posterior Radiograph  Date of Service: 11/22/2015    Indication: Left Ureteral Stone    Bony Structures: Visualized bony structures of the ribs, spine, and pelvis demonstrate no large lytic or blastic lesions.    Bowel Gas: Visualized bowel gas is very copious in upper abdomen.     Rt Kidney: The right kidney is shadow is visualized. No radiodense structures are identified in the expected location of the kidney and renal pelvis. No radiodense structures are identified in the expected location of the ureter.    Lt Kidney: The left kidney is shadow is visualized. No radiodense structures are identified in the expected location of the kidney and renal pelvis. A single radiodense structures are identified in the expected location of the ureter at level of around S2 foramina.     Bladder Area / Pelvis: No radiodense structures are identified in the expected location of the urinary bladder. No densities consistent with phleboliths are visualized.     IMPRESSION:     1 - Left distal ureteral stone in stable position at around S2 foramina by abdominal radiograph.    Phebe Colla MD     Assessment Assessed  1. Acute renal failure (N17.9) 2. Screening for prostate cancer (Z12.5) 3. Renal cyst, acquired (N28.1) 4. Nephrolithiasis (N20.0) 5. Abnormal radiologic findings on diagnostic imaging of  right kidney (K56.256)  Plan Health Maintenance  1. UA With REFLEX; [Do Not Release]; Status:Complete;   Done: [id number] 12:26PM Nephrolithiasis  2. Start: Oxycodone-Acetaminophen 5-325 MG Oral Tablet (Percocet); TAKE 1-2 TABLET  EVERY 6 HOURS AS NEEDED FOR KIDNEY STONE PAIN 3. KUB; Status:Complete;   Done: [id number] 01:59PM  Discussion/Summary 1 - Nephrolithiasis / acute renal failure - Rec HOLD LISINOPRIL until stone treated.     We discussed management strategies of ureteral stones including medical expulsive therapy (MET) (preferred for stones <34m diameter), ureteroscopic stone manipulation (URS), and shockwave lithotripsy (SWL) in detail including relative risks / benefits / and efficacy. We discussed that all patients are candidates for MET as long as can keep comfortable and hydrated.     After consideration of options, the patient has decided to proceed with SWL next week.     We discussed shockwave lithotripsy in detail as well as my "rule of 9s" with stones <985m less than 900 HU, and skin to stone distance <9cm having approximately 90% treatment success with single session of treatment. We then addressed how stones that are larger, more dense, and in patients with less favorable anatomy have  incrementally decreased success rates. We discussed risks including, bleeding, infection, hematoma, loss of kidney, need for staged therapy, need for adjunctive therapy and requirement to refrain from any anticoagulants, anti-platelet or aspirin-like products peri-procedureally. After careful consideration, the patient has chosen to proceed.       2 - Bilateral non-complex renal cysts - imaging reassuring, no mass effect or nodules. No further eval necessary.     3 - Rt renal malrotation - No obvious functional sequelae, rare anatomic variant, no specific intervention necessary.     4 - Prostate screening - up and down course and exam today reassuring. Agree with Dr. Yong Gaines that  recheck necessary and pending in May, consider further eval if not back to baseline.     5 - RTC after lithotripsy.

## 2015-11-29 NOTE — Discharge Instructions (Signed)
See [facility] Discharge instructions

## 2015-12-24 DIAGNOSIS — H40013 Open angle with borderline findings, low risk, bilateral: Secondary | ICD-10-CM | POA: Diagnosis not present

## 2015-12-24 DIAGNOSIS — H2513 Age-related nuclear cataract, bilateral: Secondary | ICD-10-CM | POA: Diagnosis not present

## 2016-01-01 DIAGNOSIS — Z0000 Encounter for general adult medical examination without abnormal findings: Secondary | ICD-10-CM | POA: Diagnosis not present

## 2016-01-01 DIAGNOSIS — N200 Calculus of kidney: Secondary | ICD-10-CM | POA: Diagnosis not present

## 2016-01-02 ENCOUNTER — Other Ambulatory Visit: Payer: Self-pay | Admitting: Family Medicine

## 2016-01-10 ENCOUNTER — Other Ambulatory Visit (INDEPENDENT_AMBULATORY_CARE_PROVIDER_SITE_OTHER): Payer: Medicare Other

## 2016-01-10 ENCOUNTER — Other Ambulatory Visit: Payer: Self-pay | Admitting: Family Medicine

## 2016-01-10 DIAGNOSIS — R739 Hyperglycemia, unspecified: Secondary | ICD-10-CM | POA: Diagnosis not present

## 2016-01-10 DIAGNOSIS — R972 Elevated prostate specific antigen [PSA]: Secondary | ICD-10-CM

## 2016-01-10 DIAGNOSIS — Z20828 Contact with and (suspected) exposure to other viral communicable diseases: Secondary | ICD-10-CM | POA: Diagnosis not present

## 2016-01-10 DIAGNOSIS — R351 Nocturia: Secondary | ICD-10-CM | POA: Diagnosis not present

## 2016-01-10 DIAGNOSIS — N401 Enlarged prostate with lower urinary tract symptoms: Secondary | ICD-10-CM | POA: Diagnosis not present

## 2016-01-10 LAB — BASIC METABOLIC PANEL
BUN: 20 mg/dL (ref 6–23)
CHLORIDE: 106 meq/L (ref 96–112)
CO2: 27 mEq/L (ref 19–32)
CREATININE: 1.21 mg/dL (ref 0.40–1.50)
Calcium: 9.4 mg/dL (ref 8.4–10.5)
GFR: 63.31 mL/min (ref >60.00)
GLUCOSE: 100 mg/dL — AB (ref 70–99)
POTASSIUM: 4.6 meq/L (ref 3.5–5.1)
Sodium: 140 mEq/L (ref 135–145)

## 2016-01-10 LAB — HEMOGLOBIN A1C: HEMOGLOBIN A1C: 6 % (ref 4.6–6.5)

## 2016-01-10 LAB — PSA: PSA: 2.35 ng/mL (ref 0.10–4.00)

## 2016-01-11 LAB — HEPATITIS C ANTIBODY: HCV AB: NEGATIVE

## 2016-01-31 ENCOUNTER — Other Ambulatory Visit: Payer: Self-pay | Admitting: Family Medicine

## 2016-04-09 ENCOUNTER — Other Ambulatory Visit: Payer: Self-pay | Admitting: Family Medicine

## 2016-05-22 ENCOUNTER — Ambulatory Visit (INDEPENDENT_AMBULATORY_CARE_PROVIDER_SITE_OTHER): Payer: Medicare Other | Admitting: Family Medicine

## 2016-05-22 ENCOUNTER — Encounter: Payer: Self-pay | Admitting: Family Medicine

## 2016-05-22 VITALS — BP 136/82 | HR 70 | Temp 98.1°F | Wt 174.0 lb

## 2016-05-22 DIAGNOSIS — Z23 Encounter for immunization: Secondary | ICD-10-CM | POA: Diagnosis not present

## 2016-05-22 DIAGNOSIS — N401 Enlarged prostate with lower urinary tract symptoms: Secondary | ICD-10-CM | POA: Diagnosis not present

## 2016-05-22 DIAGNOSIS — I10 Essential (primary) hypertension: Secondary | ICD-10-CM

## 2016-05-22 DIAGNOSIS — R351 Nocturia: Secondary | ICD-10-CM

## 2016-05-22 DIAGNOSIS — M10079 Idiopathic gout, unspecified ankle and foot: Secondary | ICD-10-CM

## 2016-05-22 DIAGNOSIS — E785 Hyperlipidemia, unspecified: Secondary | ICD-10-CM | POA: Diagnosis not present

## 2016-05-22 DIAGNOSIS — R739 Hyperglycemia, unspecified: Secondary | ICD-10-CM

## 2016-05-22 MED ORDER — ALLOPURINOL 100 MG PO TABS: ORAL_TABLET | ORAL | 11 refills | Status: DC

## 2016-05-22 MED ORDER — LISINOPRIL 5 MG PO TABS: 5.0000 mg | ORAL_TABLET | Freq: Every day | ORAL | 11 refills | Status: DC

## 2016-05-22 NOTE — Assessment & Plan Note (Signed)
S: controlled. On lisinopril 10mg  (only takes half tablet) BP Readings from Last 3 Encounters:  05/22/16 136/82  11/29/15 135/65  11/21/15 128/86  A/P:Continue current meds:  But change to 5mg  tablet

## 2016-05-22 NOTE — Progress Notes (Signed)
Subjective:  Robert Gaines is a 70 y.o. year old very pleasant male patient who presents for/with See problem oriented charting ROS- No chest pain or shortness of breath. No headache or blurry vision. Has had one gout flare right footsee any ROS included in HPI as well.   Past Medical History-  Patient Active Problem List   Diagnosis Date Noted  . Hyperglycemia 10/03/2014    Priority: Medium  . BPH associated with nocturia 10/03/2014    Priority: Medium  . Gout 06/21/2009    Priority: Medium  . Chronic kidney disease, stage III (moderate) 04/05/2008    Priority: Medium  . Hypertension 05/31/2007    Priority: Medium  . Hyperlipidemia 05/24/2007    Priority: Medium  . Seasonal allergies 05/11/2014    Priority: Low  . PSA, INCREASED 04/16/2010    Priority: Low  . ACNE ROSACEA 11/16/2008    Priority: Low  . DEPRESSION 05/24/2007    Priority: Low    Medications- reviewed and updated Current Outpatient Prescriptions  Medication Sig Dispense Refill  . allopurinol (ZYLOPRIM) 100 MG tablet Take two pills daily. 60 tablet 11  . aspirin 81 MG tablet Take 81 mg by mouth daily.      Marland Kitchen. atorvastatin (LIPITOR) 40 MG tablet TAKE 1 TABLET (40 MG TOTAL) BY MOUTH DAILY. 90 tablet 2  . Biotin 5000 MCG TABS Take 1 tablet by mouth.    . Calcium Carbonate-Vitamin D (CALCIUM-VITAMIN D) 500-200 MG-UNIT tablet Take 1 tablet by mouth daily.    Marland Kitchen. COLCRYS 0.6 MG tablet TAKE 1 TABLET EVERY DAY IF NOT ON DAILY AND IF FLARE CAN USE 2 FOLLOWED BY REPEAT DOSE IN 2 HR IF NO 60 tablet 1  . folic acid (FOLVITE) 1 MG tablet Take 1 mg by mouth daily.      Marland Kitchen. glucosamine-chondroitin 500-400 MG tablet Take 1 tablet by mouth 3 (three) times daily.    Marland Kitchen. KLS ALLERCLEAR D-24HR 10-240 MG 24 hr tablet TAKE 1 TABLET BY MOUTH DAILY. 30 tablet 11  . Krill Oil 500 MG CAPS Take 350 mg by mouth.     Marland Kitchen. lisinopril (PRINIVIL,ZESTRIL) 5 MG tablet Take 1 tablet (5 mg total) by mouth daily. 30 tablet 11  . Multiple Vitamins-Minerals  (CENTRUM SILVER PO) Take 1 tablet by mouth daily.     Marland Kitchen. pyridoxine (B-6) 200 MG tablet Take 200 mg by mouth daily.       No current facility-administered medications for this visit.     Objective: BP 136/82 (BP Location: Left Arm, Patient Position: Sitting, Cuff Size: Normal)   Pulse 70   Temp 98.1 F (36.7 C) (Oral)   Wt 174 lb (78.9 kg)   SpO2 97%   BMI 28.08 kg/m  Gen: NAD, resting comfortably  Assessment/Plan:  Hypertension S: controlled. On lisinopril 10mg  (only takes half tablet) BP Readings from Last 3 Encounters:  05/22/16 136/82  11/29/15 135/65  11/21/15 128/86  A/P:Continue current meds:  But change to 5mg  tablet  Hyperlipidemia S: mild poorly controlled on atorvastatin 40mg . No myalgias.  Lab Results  Component Value Date   CHOL 191 10/04/2015   HDL 47.60 10/04/2015   LDLCALC 104 (H) 10/04/2015   LDLDIRECT 99.6 09/21/2013   TRIG 195.0 (H) 10/04/2015   CHOLHDL 4 10/04/2015   A/P: has lost weight- will recheck lipids before awv, hopeful LDL <100   BPH associated with nocturia S: likely bph as cause with prior bump in PSA fortunately trended back down Lab Results  Component  Value Date   PSA 2.35 01/10/2016   PSA 4.15 (H) 10/04/2015   PSA 2.60 10/03/2014  A/P: repeat PSA at awv  Gout S: on allopurinol 200mg . Has colcrys on hand and has  Used once with flare in last 7 months.  Lab Results  Component Value Date   LABURIC 6.0 10/04/2015  A/P: update uric acid with awv, continue current medicines- monitor flare ups  AWV 10/14/16 with labs 10/04/16 or later  Orders Placed This Encounter  Procedures  . Flu vaccine HIGH DOSE PF  . Hemoglobin A1c    [place]    Standing Status:   Future    Standing Expiration Date:   05/22/2017  . CBC    Standing Status:   Future    Standing Expiration Date:   05/22/2017  . Comprehensive metabolic panel    [place]    Standing Status:   Future    Standing Expiration Date:   05/22/2017  . Lipid panel    Standing  Status:   Future    Standing Expiration Date:   05/22/2017  . PSA    Standing Status:   Future    Standing Expiration Date:   05/22/2017  . Uric Acid    Standing Status:   Future    Standing Expiration Date:   05/22/2017    Meds ordered this encounter  Medications  . Biotin 5000 MCG TABS    Sig: Take 1 tablet by mouth.  Marland Kitchen allopurinol (ZYLOPRIM) 100 MG tablet    Sig: Take two pills daily.    Dispense:  60 tablet    Refill:  11  . lisinopril (PRINIVIL,ZESTRIL) 5 MG tablet    Sig: Take 1 tablet (5 mg total) by mouth daily.    Dispense:  30 tablet    Refill:  11    Return precautions advised.  Tana Conch, MD

## 2016-05-22 NOTE — Assessment & Plan Note (Signed)
S: likely bph as cause with prior bump in PSA fortunately trended back down Lab Results  Component Value Date   PSA 2.35 01/10/2016   PSA 4.15 (H) 10/04/2015   PSA 2.60 10/03/2014  A/P: repeat PSA at awv

## 2016-05-22 NOTE — Assessment & Plan Note (Signed)
S: mild poorly controlled on atorvastatin 40mg . No myalgias.  Lab Results  Component Value Date   CHOL 191 10/04/2015   HDL 47.60 10/04/2015   LDLCALC 104 (H) 10/04/2015   LDLDIRECT 99.6 09/21/2013   TRIG 195.0 (H) 10/04/2015   CHOLHDL 4 10/04/2015   A/P: has lost weight- will recheck lipids before awv, hopeful LDL <100

## 2016-05-22 NOTE — Assessment & Plan Note (Signed)
S: on allopurinol 200mg . Has colcrys on hand and has  Used once with flare in last 7 months.  Lab Results  Component Value Date   LABURIC 6.0 10/04/2015  A/P: update uric acid with awv, continue current medicines- monitor flare ups

## 2016-05-22 NOTE — Progress Notes (Signed)
Pre visit review using our clinic review tool, if applicable. No additional management support is needed unless otherwise documented below in the visit note. 

## 2016-05-22 NOTE — Patient Instructions (Addendum)
Changed meds slightly- lisinopril take full 5 mg tablet now  Return in 6 months for AWV. Labs a few days before- ONLY the ones that I ordered today. Labs 10/05/15 or later.   Have Margie send me a message a few weeks before her labs so I can order them for her AWV. DO not let lab order labs for either of you- only my orders as on medicare  Orders Placed This Encounter  Procedures  . Flu vaccine HIGH DOSE PF  . Hemoglobin A1c    [place]    Standing Status:   Future    Standing Expiration Date:   05/22/2017  . CBC    Standing Status:   Future    Standing Expiration Date:   05/22/2017  . Comprehensive metabolic panel    [place]    Standing Status:   Future    Standing Expiration Date:   05/22/2017  . Lipid panel    Standing Status:   Future    Standing Expiration Date:   05/22/2017  . PSA    Standing Status:   Future    Standing Expiration Date:   05/22/2017  . Uric Acid    Standing Status:   Future    Standing Expiration Date:   05/22/2017

## 2016-07-01 DIAGNOSIS — Z87442 Personal history of urinary calculi: Secondary | ICD-10-CM | POA: Diagnosis not present

## 2016-09-18 ENCOUNTER — Other Ambulatory Visit: Payer: Self-pay | Admitting: Family Medicine

## 2016-10-07 ENCOUNTER — Other Ambulatory Visit (INDEPENDENT_AMBULATORY_CARE_PROVIDER_SITE_OTHER): Payer: Medicare Other

## 2016-10-07 DIAGNOSIS — M10079 Idiopathic gout, unspecified ankle and foot: Secondary | ICD-10-CM | POA: Diagnosis not present

## 2016-10-07 DIAGNOSIS — E785 Hyperlipidemia, unspecified: Secondary | ICD-10-CM | POA: Diagnosis not present

## 2016-10-07 DIAGNOSIS — R739 Hyperglycemia, unspecified: Secondary | ICD-10-CM | POA: Diagnosis not present

## 2016-10-07 DIAGNOSIS — R351 Nocturia: Secondary | ICD-10-CM | POA: Diagnosis not present

## 2016-10-07 DIAGNOSIS — N401 Enlarged prostate with lower urinary tract symptoms: Secondary | ICD-10-CM | POA: Diagnosis not present

## 2016-10-07 LAB — COMPREHENSIVE METABOLIC PANEL
ALBUMIN: 4.3 g/dL (ref 3.5–5.2)
ALK PHOS: 38 U/L — AB (ref 39–117)
ALT: 22 U/L (ref 0–53)
AST: 24 U/L (ref 0–37)
BILIRUBIN TOTAL: 0.6 mg/dL (ref 0.2–1.2)
BUN: 23 mg/dL (ref 6–23)
CO2: 28 mEq/L (ref 19–32)
CREATININE: 1.21 mg/dL (ref 0.40–1.50)
Calcium: 9.1 mg/dL (ref 8.4–10.5)
Chloride: 106 mEq/L (ref 96–112)
GFR: 63.17 mL/min (ref >60.00)
Glucose, Bld: 109 mg/dL — ABNORMAL HIGH (ref 70–99)
Potassium: 4.5 mEq/L (ref 3.5–5.1)
SODIUM: 139 meq/L (ref 135–145)
TOTAL PROTEIN: 6.2 g/dL (ref 6.0–8.3)

## 2016-10-07 LAB — CBC
HCT: 41.2 % (ref 39.0–52.0)
Hemoglobin: 14.1 g/dL (ref 13.0–17.0)
MCHC: 34.2 g/dL (ref 30.0–36.0)
MCV: 90.9 fl (ref 78.0–100.0)
Platelets: 255 10*3/uL (ref 150.0–400.0)
RBC: 4.53 Mil/uL (ref 4.22–5.81)
RDW: 14 % (ref 11.5–15.5)
WBC: 6.5 10*3/uL (ref 4.0–10.5)

## 2016-10-07 LAB — LIPID PANEL
CHOLESTEROL: 164 mg/dL (ref 0–200)
HDL: 47.9 mg/dL (ref >39.00)
LDL Cholesterol: 87 mg/dL (ref 0–99)
NonHDL: 116.59
Total CHOL/HDL Ratio: 3
Triglycerides: 147 mg/dL (ref 0.0–149.0)
VLDL: 29.4 mg/dL (ref 0.0–40.0)

## 2016-10-07 LAB — URIC ACID: URIC ACID, SERUM: 6.5 mg/dL (ref 4.0–7.8)

## 2016-10-07 LAB — HEMOGLOBIN A1C: Hgb A1c MFr Bld: 6.1 % (ref 4.6–6.5)

## 2016-10-07 LAB — PSA: PSA: 3.32 ng/mL (ref 0.10–4.00)

## 2016-10-14 ENCOUNTER — Ambulatory Visit (INDEPENDENT_AMBULATORY_CARE_PROVIDER_SITE_OTHER): Payer: Medicare Other | Admitting: Family Medicine

## 2016-10-14 ENCOUNTER — Encounter: Payer: Self-pay | Admitting: Family Medicine

## 2016-10-14 VITALS — BP 128/58 | HR 67 | Temp 98.1°F | Ht 65.0 in | Wt 178.8 lb

## 2016-10-14 DIAGNOSIS — R739 Hyperglycemia, unspecified: Secondary | ICD-10-CM

## 2016-10-14 DIAGNOSIS — Z0001 Encounter for general adult medical examination with abnormal findings: Secondary | ICD-10-CM | POA: Diagnosis not present

## 2016-10-14 DIAGNOSIS — R972 Elevated prostate specific antigen [PSA]: Secondary | ICD-10-CM | POA: Diagnosis not present

## 2016-10-14 NOTE — Progress Notes (Signed)
Pre visit review using our clinic review tool, if applicable. No additional management support is needed unless otherwise documented below in the visit note. 

## 2016-10-14 NOTE — Patient Instructions (Addendum)
Schedule lab visit in 6 months then see me a few days later- recheck a1c and psa. Try to work on getting extra weight off   [name] , Thank you for taking time to come for your Medicare Wellness Visit. I appreciate your ongoing commitment to your health goals. Please review the following plan we discussed and let me know if I can assist you in the future.   These are the goals we discussed: 1. Trimming off 5-10 lbs in 6 months at least   This is a list of the screening recommended for you and due dates:  Health Maintenance  Topic Date Due  . Tetanus Vaccine  04/05/2018  . Colon Cancer Screening  11/03/2023  . Flu Shot  Completed  .  Hepatitis C: One time screening is recommended by Center for Disease Control  (CDC) for  adults born from [id number] through 1965.   Completed  . Pneumonia vaccines  Completed

## 2016-10-14 NOTE — Progress Notes (Signed)
Phone: [phone]  Subjective:  Patient presents today for their annual wellness visit.    Preventive Screening-Counseling & Management  Smoking Status: Never Smoker Second Hand Smoking status: No smokers in home  Risk Factors Regular exercise:  3x a week cardio, some lifting as well- 1.5-2 hours in gym Diet: weight trending up recently- discussed reversing trend. Does eat out daily- working on eating healthy choices but wife doesn't cook and he prefers not to.  Wt Readings from Last 3 Encounters:  10/14/16 178 lb 12.8 oz (81.1 kg)  05/22/16 174 lb (78.9 kg)  11/29/15 168 lb 2 oz (76.3 kg)  Fall Risk: None  Fall Risk  10/14/2016 10/09/2015 10/03/2014 09/21/2013 09/21/2013  Falls in the past year? No No No No No    Cardiac risk factors:  advanced age (older than 68 for men, 28 for women)  Hyperlipidemia - LDL reasonable at 87 on atorvastatin 40mg   Lab Results  Component Value Date   CHOL 164 10/07/2016   HDL 47.90 10/07/2016   LDLCALC 87 10/07/2016   LDLDIRECT 99.6 09/21/2013   TRIG 147.0 10/07/2016   CHOLHDL 3 10/07/2016  No diabetes. But at risk Family History: father MI at 54   Depression Screen None. PHQ2 0  Depression screen [facility] 2/9 10/14/2016 10/09/2015 10/03/2014 09/21/2013 09/21/2013  Decreased Interest 0 0 0 0 0  Down, Depressed, Hopeless 0 0 0 0 0  PHQ - 2 Score 0 0 0 0 0    Activities of Daily Living Independent ADLs and IADLs   Hearing Difficulties: -patient declines  Cognitive Testing No reported trouble.   Normal 3 word recall  List the Names of Other Physician/Practitioners you currently use: -Dr. Nile Riggs optho  Immunization History  Administered Date(s) Administered  . Influenza Split 06/05/2011, 05/06/2012  . Influenza Whole 08/25/2000, 05/31/2007, 05/17/2009, 06/06/2010  . Influenza, High Dose Seasonal PF 05/22/2016  . Influenza,inj,Quad PF,36+ Mos 08/09/2013, 05/11/2014, 05/24/2015  . Pneumococcal Conjugate-13 10/09/2015  .  Pneumococcal Polysaccharide-23 05/11/2014  . Td 06/25/1998, 04/05/2008   Required Immunizations needed today : none, could get shingrix next year.   Screening tests- up to date Colon cancer screening- 10/2013 with 10 year repeat PSA screening- per discussion below  ROS- No pertinent positives discovered in course of AWV ROS pertinent- nocturia once a night, stream somewhat weak. No chest pain or shortness of breath. No headache or blurry vision.   The following were reviewed and entered/updated in epic: Past Medical History:  Diagnosis Date  . BPH associated with nocturia 10/03/2014  . Depression   . Hyperlipidemia   . Hypertension   . PSA, INCREASED 04/16/2010   History prostatitis x 2, # decreased with ciprofloxacin    . Rosacea    Patient Active Problem List   Diagnosis Date Noted  . Hyperglycemia 10/03/2014    Priority: Medium  . BPH associated with nocturia 10/03/2014    Priority: Medium  . Gout 06/21/2009    Priority: Medium  . Chronic kidney disease, stage III (moderate) 04/05/2008    Priority: Medium  . Hypertension 05/31/2007    Priority: Medium  . Hyperlipidemia 05/24/2007    Priority: Medium  . Seasonal allergies 05/11/2014    Priority: Low  . PSA, INCREASED 04/16/2010    Priority: Low  . ACNE ROSACEA 11/16/2008    Priority: Low  . DEPRESSION 05/24/2007    Priority: Low   Past Surgical History:  Procedure Laterality Date  . FLEXIBLE SIGMOIDOSCOPY    . TONSILLECTOMY     at  around 41-9 yrs old  . WISDOM TOOTH EXTRACTION      Family History  Problem Relation Age of Onset  . Heart disease Father     MI 36, smoker  . Colon cancer Neg Hx   . Esophageal cancer Neg Hx   . Rectal cancer Neg Hx   . Stomach cancer Neg Hx     Medications- reviewed and updated Current Outpatient Prescriptions  Medication Sig Dispense Refill  . allopurinol (ZYLOPRIM) 100 MG tablet Take two pills daily. 60 tablet 11  . aspirin 81 MG tablet Take 81 mg by mouth daily.      Marland Kitchen  atorvastatin (LIPITOR) 40 MG tablet TAKE 1 TABLET (40 MG TOTAL) BY MOUTH DAILY. 90 tablet 2  . Biotin 5000 MCG TABS Take 1 tablet by mouth.    . Calcium Carbonate-Vitamin D (CALCIUM-VITAMIN D) 500-200 MG-UNIT tablet Take 1 tablet by mouth daily.    Marland Kitchen COLCRYS 0.6 MG tablet TAKE 1 TABLET EVERY DAY IF NOT ON DAILY AND IF FLARE CAN USE 2 FOLLOWED BY REPEAT DOSE IN 2 HR IF NO 60 tablet 1  . folic acid (FOLVITE) 1 MG tablet Take 1 mg by mouth daily.      Marland Kitchen glucosamine-chondroitin 500-400 MG tablet Take 1 tablet by mouth 3 (three) times daily.    Boris Lown Oil 500 MG CAPS Take 350 mg by mouth.     Marland Kitchen lisinopril (PRINIVIL,ZESTRIL) 5 MG tablet Take 1 tablet (5 mg total) by mouth daily. 30 tablet 11  . Multiple Vitamins-Minerals (CENTRUM SILVER PO) Take 1 tablet by mouth daily.     Marland Kitchen pyridoxine (B-6) 200 MG tablet Take 200 mg by mouth daily.       No current facility-administered medications for this visit.     Allergies-reviewed and updated No Known Allergies  Social History   Social History  . Marital status: Married    Spouse name: N/A  . Number of children: N/A  . Years of education: N/A   Social History Main Topics  . Smoking status: Never Smoker  . Smokeless tobacco: Never Used  . Alcohol use 0.0 oz/week     Comment: occas  . Drug use: No  . Sexual activity: Not Asked   Other Topics Concern  . None   Social History Narrative   Married 1978. Son from first marriage [name] (1971-psychiatrist in [place]). No grandkids.    2 dogs-sheltie/collie mix and border collie mix      Retired from Medical laboratory scientific officer: time with dogs, exercising    Objective: BP (!) 128/58 (BP Location: Left Arm, Patient Position: Sitting, Cuff Size: Large)   Pulse 67   Temp 98.1 F (36.7 C) (Oral)   Ht 5\' 5"  (1.651 m)   Wt 178 lb 12.8 oz (81.1 kg)   SpO2 95%   BMI 29.75 kg/m  Gen: NAD, resting comfortably HEENT: Mucous membranes are moist. Oropharynx normal Neck: no  thyromegaly CV: RRR no murmurs rubs or gallops Lungs: CTAB no crackles, wheeze, rhonchi Abdomen: soft/nontender/nondistended/normal bowel sounds.overweight  Ext: no edema Skin: warm, dry Neuro: grossly normal, moves all extremities, PERRLA Rectal: normal tone, diffusely enlarged prostate, no masses or tenderness   Assessment/Plan:  AWV completed- discussed recommended screenings anddocumented any personalized health advice and referrals for preventive counseling. See AVS as well which was given to patient.   Status of chronic or acute concerns  Hyperglycemia- discussed increased risk of diabetes and metformin option. Wants to  pursue only if a1c up in 6 months Lab Results  Component Value Date   HGBA1C 6.1 10/07/2016   CKD III- avoid nsaids. Usually above 50- actually above 60 last 2 checks. Also on lisinopril  HTN- bp controlled on lisionopril 5mg . bp better off decongestant.  BP Readings from Last 3 Encounters:  10/14/16 (!) 128/58  05/22/16 136/82  11/29/15 135/65   HLD at goal on lipitor 40mg  with LDL under 100  Gout- controlled reasonably though uric acid slightly above goal on allopurinol 200mg . No flares in 6 months.  Lab Results  Component Value Date   LABURIC 6.5 10/07/2016   BPH with nocturia- discussed referral to urology- for now wants 6 month repeat with us.  Lab Results  Component Value Date   PSA 3.32 10/07/2016   PSA 2.35 01/10/2016   PSA 4.15 (H) 10/04/2015   Return in about 6 months (around 04/13/2017) for follow up- or sooner if needed.   Orders Placed This Encounter  Procedures  . PSA    Standing Status:   Future    Standing Expiration Date:   10/14/2017  . Basic metabolic panel    Standing Status:   Future    Standing Expiration Date:   10/14/2017  . Hemoglobin A1c    [place]    Standing Status:   Future    Standing Expiration Date:   10/14/2017  if a1c higher- consider metformin  Return precautions advised. Tana ConchStephen Aprile Dickenson, MD

## 2016-12-22 IMAGING — CR DG ABDOMEN 1V
2 series · 2 of 2 positions shown · non-contrast
Comparison: No priors.

CLINICAL DATA: 69-year-old male with history of constipation for
the past 5 days. Pressure in the abdomen.

EXAM:
ABDOMEN - 1 VIEW

[t abdomen supine (1 of 2)]
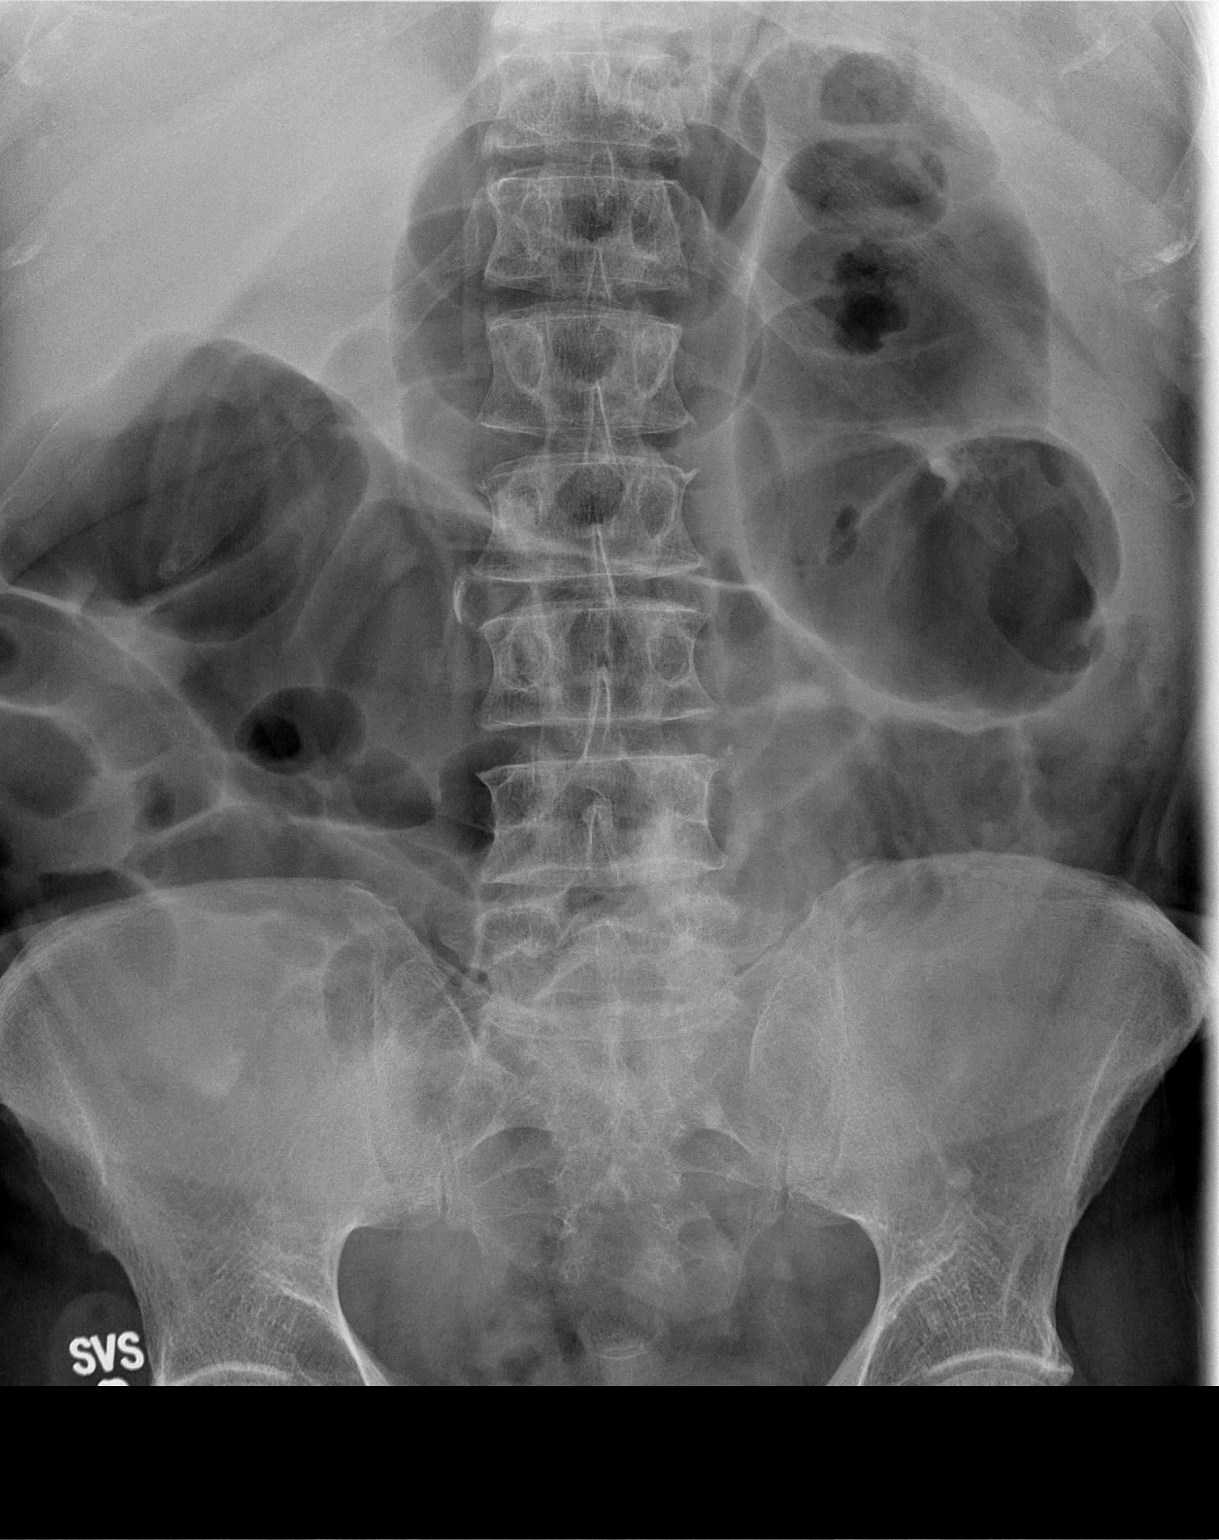

[t abdomen supine (2 of 2)]
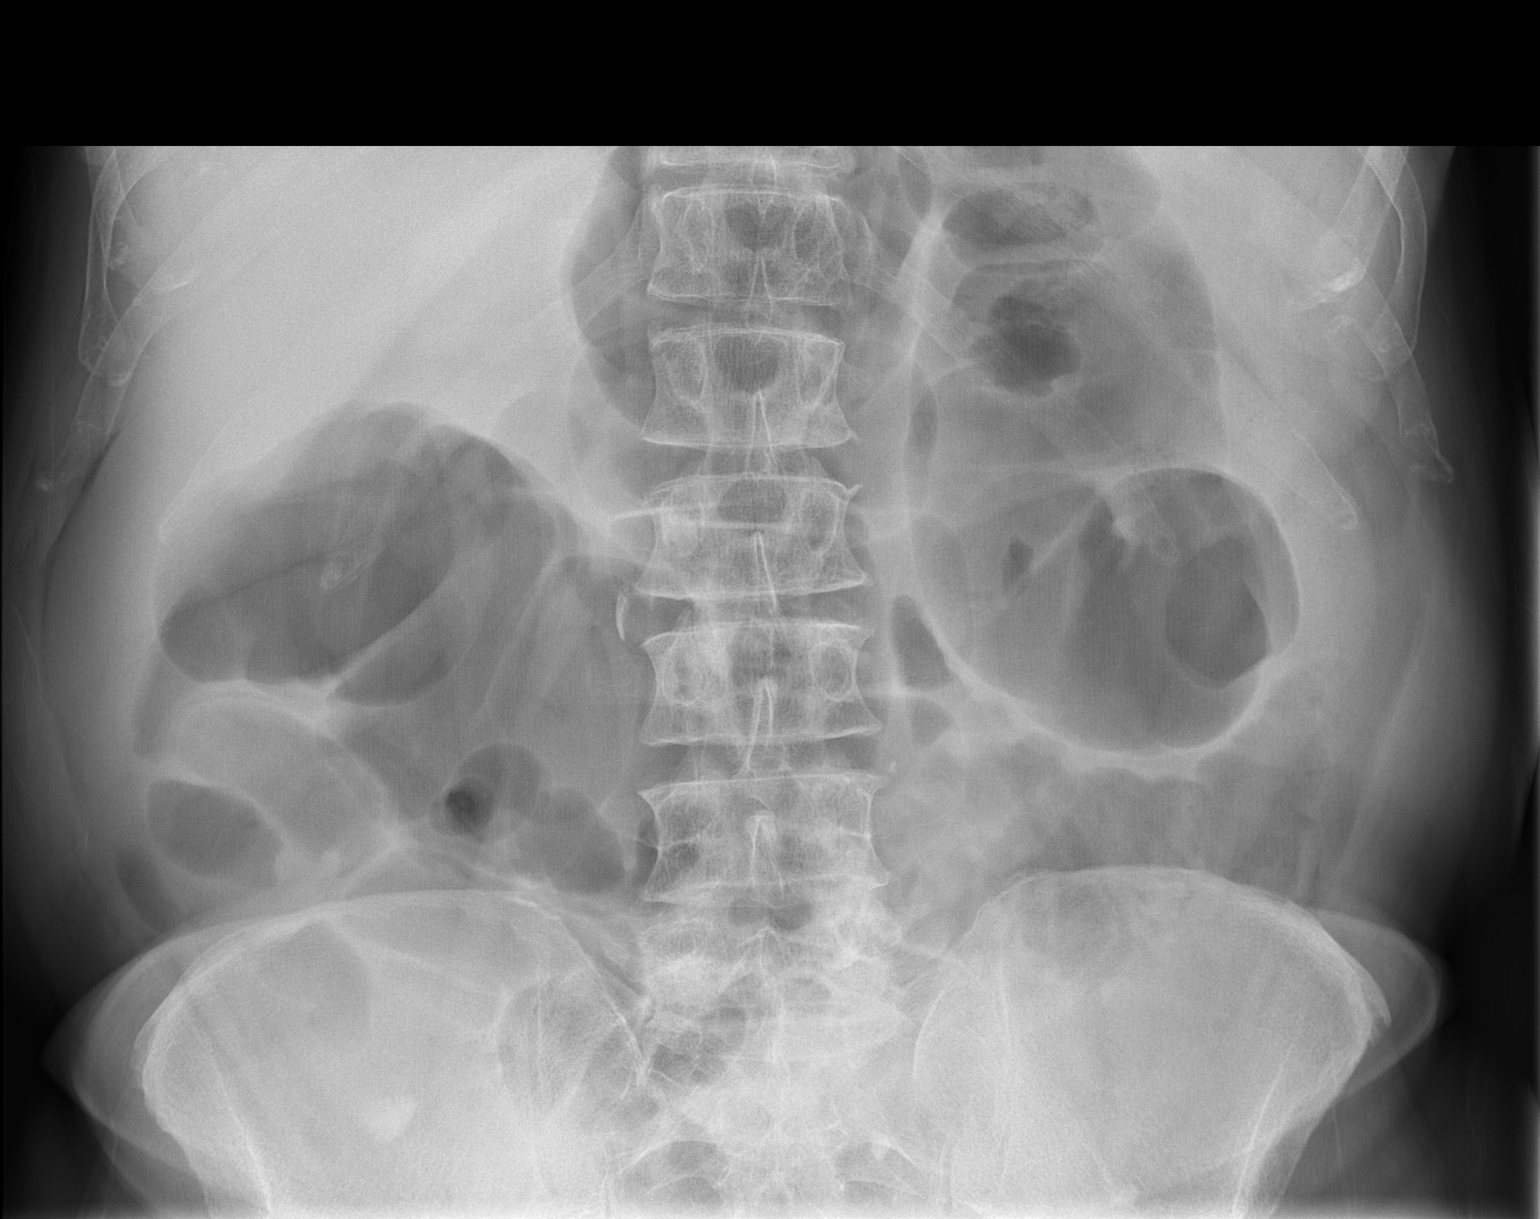

[2 of 2 positions shown; findings below may reference images not displayed]

FINDINGS: Mild gaseous distention throughout the transverse colon. Gas and
stool are noted to the level of the distal rectum. There are several
nondilated loops of gas-filled small bowel in the central abdomen.
No pathologic dilatation of small bowel. No pneumoperitoneum.
IMPRESSION: 1. Nonspecific, nonobstructive bowel gas pattern, as above.
2. No pneumoperitoneum.

## 2016-12-25 DIAGNOSIS — H2513 Age-related nuclear cataract, bilateral: Secondary | ICD-10-CM | POA: Diagnosis not present

## 2017-04-06 ENCOUNTER — Other Ambulatory Visit (INDEPENDENT_AMBULATORY_CARE_PROVIDER_SITE_OTHER): Payer: Medicare Other

## 2017-04-06 DIAGNOSIS — R739 Hyperglycemia, unspecified: Secondary | ICD-10-CM

## 2017-04-06 DIAGNOSIS — R972 Elevated prostate specific antigen [PSA]: Secondary | ICD-10-CM | POA: Diagnosis not present

## 2017-04-06 LAB — HEMOGLOBIN A1C: HEMOGLOBIN A1C: 6 % (ref 4.6–6.5)

## 2017-04-06 LAB — BASIC METABOLIC PANEL
BUN: 21 mg/dL (ref 6–23)
CHLORIDE: 105 meq/L (ref 96–112)
CO2: 27 meq/L (ref 19–32)
CREATININE: 1.25 mg/dL (ref 0.40–1.50)
Calcium: 9 mg/dL (ref 8.4–10.5)
GFR: 60.75 mL/min (ref >60.00)
Glucose, Bld: 101 mg/dL — ABNORMAL HIGH (ref 70–99)
POTASSIUM: 4.7 meq/L (ref 3.5–5.1)
Sodium: 140 mEq/L (ref 135–145)

## 2017-04-06 LAB — PSA: PSA: 2.65 ng/mL (ref 0.10–4.00)

## 2017-04-13 ENCOUNTER — Encounter: Payer: Self-pay | Admitting: Family Medicine

## 2017-04-13 ENCOUNTER — Ambulatory Visit (INDEPENDENT_AMBULATORY_CARE_PROVIDER_SITE_OTHER): Payer: Medicare Other | Admitting: Family Medicine

## 2017-04-13 VITALS — BP 126/78 | HR 62 | Temp 97.9°F | Wt 178.0 lb

## 2017-04-13 DIAGNOSIS — R739 Hyperglycemia, unspecified: Secondary | ICD-10-CM | POA: Diagnosis not present

## 2017-04-13 DIAGNOSIS — I10 Essential (primary) hypertension: Secondary | ICD-10-CM | POA: Diagnosis not present

## 2017-04-13 DIAGNOSIS — R351 Nocturia: Secondary | ICD-10-CM | POA: Diagnosis not present

## 2017-04-13 DIAGNOSIS — E785 Hyperlipidemia, unspecified: Secondary | ICD-10-CM | POA: Diagnosis not present

## 2017-04-13 DIAGNOSIS — N183 Chronic kidney disease, stage 3 unspecified: Secondary | ICD-10-CM

## 2017-04-13 DIAGNOSIS — N401 Enlarged prostate with lower urinary tract symptoms: Secondary | ICD-10-CM

## 2017-04-13 NOTE — Assessment & Plan Note (Signed)
S:   Weight stable over last 6 months. Continues regular exercise- MWF and adding Thursday some weeks. a1c down from 6.1 in February.  Lab Results  Component Value Date   HGBA1C 6.0 04/06/2017  A/P: he still would like to get back closer to 170

## 2017-04-13 NOTE — Progress Notes (Signed)
Subjective:  Robert Gaines is a 71 y.o. year old very pleasant male patient who presents for/with See problem oriented charting ROS- No chest pain or shortness of breath. No headache or blurry vision.    Past Medical History-  Patient Active Problem List   Diagnosis Date Noted  . Hyperglycemia 10/03/2014    Priority: Medium  . BPH associated with nocturia 10/03/2014    Priority: Medium  . Gout 06/21/2009    Priority: Medium  . Chronic kidney disease, stage III (moderate) 04/05/2008    Priority: Medium  . Hypertension 05/31/2007    Priority: Medium  . Hyperlipidemia 05/24/2007    Priority: Medium  . Seasonal allergies 05/11/2014    Priority: Low  . PSA, INCREASED 04/16/2010    Priority: Low  . ACNE ROSACEA 11/16/2008    Priority: Low  . DEPRESSION 05/24/2007    Priority: Low    Medications- reviewed and updated Current Outpatient Prescriptions  Medication Sig Dispense Refill  . allopurinol (ZYLOPRIM) 100 MG tablet Take two pills daily. 60 tablet 11  . aspirin 81 MG tablet Take 81 mg by mouth daily.      Marland Kitchen atorvastatin (LIPITOR) 40 MG tablet TAKE 1 TABLET (40 MG TOTAL) BY MOUTH DAILY. 90 tablet 2  . Biotin 5000 MCG TABS Take 1 tablet by mouth.    . Calcium Carbonate-Vitamin D (CALCIUM-VITAMIN D) 500-200 MG-UNIT tablet Take 1 tablet by mouth daily.    Marland Kitchen COLCRYS 0.6 MG tablet TAKE 1 TABLET EVERY DAY IF NOT ON DAILY AND IF FLARE CAN USE 2 FOLLOWED BY REPEAT DOSE IN 2 HR IF NO 60 tablet 1  . cyanocobalamin 1000 MCG tablet Take 1,000 mcg by mouth daily.    . folic acid (FOLVITE) 1 MG tablet Take 1 mg by mouth daily.      Marland Kitchen glucosamine-chondroitin 500-400 MG tablet Take 1 tablet by mouth 3 (three) times daily.    Boris Lown Oil 500 MG CAPS Take 350 mg by mouth.     Marland Kitchen lisinopril (PRINIVIL,ZESTRIL) 5 MG tablet Take 1 tablet (5 mg total) by mouth daily. 30 tablet 11  . Multiple Vitamins-Minerals (CENTRUM SILVER PO) Take 1 tablet by mouth daily.     Marland Kitchen pyridoxine (B-6) 200 MG tablet Take  200 mg by mouth daily.       No current facility-administered medications for this visit.     Objective: BP 126/78 (BP Location: Left Arm, Patient Position: Sitting, Cuff Size: Normal)   Pulse 62   Temp 97.9 F (36.6 C) (Oral)   Wt 178 lb (80.7 kg)   SpO2 97%   BMI 29.62 kg/m  Gen: NAD, resting comfortably CV: RRR no murmurs rubs or gallops Lungs: CTAB no crackles, wheeze, rhonchi Abdomen: soft/nontender/nondistended Ext: no edema Skin: warm, dry  Assessment/Plan:  Hypertension S: controlled on lisinopril 5mg .  BP Readings from Last 3 Encounters:  04/13/17 126/78  10/14/16 (!) 128/58  05/22/16 136/82  A/P: We discussed blood pressure goal of <140/90. Continue current meds  Hyperlipidemia S:  controlled on lipitor 40mg . No myalgias.  Lab Results  Component Value Date   CHOL 164 10/07/2016   HDL 47.90 10/07/2016   LDLCALC 87 10/07/2016   LDLDIRECT 99.6 09/21/2013   TRIG 147.0 10/07/2016   CHOLHDL 3 10/07/2016   A/P: update at CPE  Hyperglycemia S:   Weight stable over last 6 months. Continues regular exercise- MWF and adding Thursday some weeks. a1c down from 6.1 in February.  Lab Results  Component Value  Date   HGBA1C 6.0 04/06/2017  A/P: he still would like to get back closer to 170  BPH associated with nocturia S: PSA went back down this visit close to baseline- symptoms are not bothering him enough that he would want medication Lab Results  Component Value Date   PSA 2.65 04/06/2017   PSA 3.32 10/07/2016   PSA 2.35 01/10/2016  A/P: continue to monitor PSA at least yearly but will repeat in 6 months along with AWV   Chronic kidney disease, stage III (moderate) S: GFR has been above 60 on last 2 checks and recently just above 60. Compliant with lisinopril A/P: continue to follow- if consistent over 2 years then would change to CKD II  Wife had asked me to encourage him to see dermatology- has not been in years- I encouraged him to see [place] derm. Can  check in next time. He has no concerns about his skin  Return in about 6 months (around 10/14/2017) for follow up- or sooner if needed. schedule labs 2-3 days before. .  Orders Placed This Encounter  Procedures  . Hemoglobin A1c    [place]    Standing Status:   Future    Standing Expiration Date:   04/13/2018  . CBC    Standing Status:   Future    Standing Expiration Date:   04/13/2018  . Comprehensive metabolic panel    [place]    Standing Status:   Future    Standing Expiration Date:   04/13/2018  . Lipid panel    Standing Status:   Future    Standing Expiration Date:   04/13/2018  . PSA    Standing Status:   Future    Standing Expiration Date:   04/13/2018    Meds ordered this encounter  Medications  . cyanocobalamin 1000 MCG tablet    Sig: Take 1,000 mcg by mouth daily.    Return precautions advised.  Tana Conch, MD

## 2017-04-13 NOTE — Patient Instructions (Addendum)
Consider getting a full skin exam with dermatology- [place] derm  No other changes today- keep up effort on exercise and targetting closer to 170  Get flu shot in the fall

## 2017-04-13 NOTE — Assessment & Plan Note (Signed)
S: GFR has been above 60 on last 2 checks and recently just above 60. Compliant with lisinopril A/P: continue to follow- if consistent over 2 years then would change to CKD II

## 2017-04-13 NOTE — Assessment & Plan Note (Signed)
S: PSA went back down this visit close to baseline- symptoms are not bothering him enough that he would want medication Lab Results  Component Value Date   PSA 2.65 04/06/2017   PSA 3.32 10/07/2016   PSA 2.35 01/10/2016  A/P: continue to monitor PSA at least yearly but will repeat in 6 months along with AWV

## 2017-05-06 ENCOUNTER — Other Ambulatory Visit: Payer: Self-pay | Admitting: Family Medicine

## 2017-06-08 DIAGNOSIS — Z23 Encounter for immunization: Secondary | ICD-10-CM | POA: Diagnosis not present

## 2017-06-09 ENCOUNTER — Other Ambulatory Visit: Payer: Self-pay

## 2017-06-09 MED ORDER — ATORVASTATIN CALCIUM 40 MG PO TABS: ORAL_TABLET | ORAL | 2 refills | Status: DC

## 2017-07-01 ENCOUNTER — Other Ambulatory Visit: Payer: Self-pay

## 2017-07-01 MED ORDER — ALLOPURINOL 100 MG PO TABS: ORAL_TABLET | ORAL | 11 refills | Status: DC

## 2017-09-19 ENCOUNTER — Encounter: Payer: Self-pay | Admitting: Family Medicine

## 2017-09-22 ENCOUNTER — Ambulatory Visit: Payer: Medicare Other | Admitting: Family Medicine

## 2017-10-22 ENCOUNTER — Encounter: Payer: Medicare Other | Admitting: Family Medicine

## 2017-10-23 ENCOUNTER — Other Ambulatory Visit: Payer: Self-pay

## 2017-10-23 MED ORDER — LISINOPRIL 5 MG PO TABS: 5.0000 mg | ORAL_TABLET | Freq: Every day | ORAL | 1 refills | Status: DC

## 2017-12-31 DIAGNOSIS — H2513 Age-related nuclear cataract, bilateral: Secondary | ICD-10-CM | POA: Diagnosis not present

## 2018-01-08 ENCOUNTER — Telehealth: Payer: Self-pay | Admitting: Radiology

## 2018-01-08 ENCOUNTER — Ambulatory Visit (INDEPENDENT_AMBULATORY_CARE_PROVIDER_SITE_OTHER): Payer: Medicare Other | Admitting: Family Medicine

## 2018-01-08 ENCOUNTER — Encounter

## 2018-01-08 ENCOUNTER — Encounter: Payer: Self-pay | Admitting: Family Medicine

## 2018-01-08 VITALS — BP 122/72 | HR 65 | Temp 97.5°F | Ht 65.0 in | Wt 179.8 lb

## 2018-01-08 DIAGNOSIS — I10 Essential (primary) hypertension: Secondary | ICD-10-CM | POA: Diagnosis not present

## 2018-01-08 DIAGNOSIS — R739 Hyperglycemia, unspecified: Secondary | ICD-10-CM

## 2018-01-08 DIAGNOSIS — N401 Enlarged prostate with lower urinary tract symptoms: Secondary | ICD-10-CM | POA: Diagnosis not present

## 2018-01-08 DIAGNOSIS — N183 Chronic kidney disease, stage 3 unspecified: Secondary | ICD-10-CM

## 2018-01-08 DIAGNOSIS — Z0000 Encounter for general adult medical examination without abnormal findings: Secondary | ICD-10-CM | POA: Diagnosis not present

## 2018-01-08 DIAGNOSIS — E785 Hyperlipidemia, unspecified: Secondary | ICD-10-CM | POA: Diagnosis not present

## 2018-01-08 DIAGNOSIS — R351 Nocturia: Secondary | ICD-10-CM

## 2018-01-08 DIAGNOSIS — M10079 Idiopathic gout, unspecified ankle and foot: Secondary | ICD-10-CM | POA: Diagnosis not present

## 2018-01-08 LAB — COMPREHENSIVE METABOLIC PANEL
ALT: 21 U/L (ref 0–53)
AST: 26 U/L (ref 0–37)
Albumin: 4.6 g/dL (ref 3.5–5.2)
Alkaline Phosphatase: 37 U/L — ABNORMAL LOW (ref 39–117)
BUN: 19 mg/dL (ref 6–23)
CALCIUM: 9.6 mg/dL (ref 8.4–10.5)
CHLORIDE: 102 meq/L (ref 96–112)
CO2: 30 meq/L (ref 19–32)
Creatinine, Ser: 1.3 mg/dL (ref 0.40–1.50)
GFR: 57.94 mL/min — ABNORMAL LOW (ref >60.00)
Glucose, Bld: 105 mg/dL — ABNORMAL HIGH (ref 70–99)
POTASSIUM: 5 meq/L (ref 3.5–5.1)
Sodium: 140 mEq/L (ref 135–145)
Total Bilirubin: 0.7 mg/dL (ref 0.2–1.2)
Total Protein: 7.1 g/dL (ref 6.0–8.3)

## 2018-01-08 LAB — PSA: PSA: 4.14 ng/mL — ABNORMAL HIGH (ref 0.10–4.00)

## 2018-01-08 LAB — URIC ACID: Uric Acid, Serum: 5.8 mg/dL (ref 4.0–7.8)

## 2018-01-08 LAB — LIPID PANEL
CHOL/HDL RATIO: 4
CHOLESTEROL: 182 mg/dL (ref 0–200)
HDL: 48 mg/dL (ref >39.00)
NonHDL: 134.45
TRIGLYCERIDES: 208 mg/dL — AB (ref 0.0–149.0)
VLDL: 41.6 mg/dL — AB (ref 0.0–40.0)

## 2018-01-08 LAB — HEMOGLOBIN A1C: HEMOGLOBIN A1C: 5.9 % (ref 4.6–6.5)

## 2018-01-08 LAB — LDL CHOLESTEROL, DIRECT: Direct LDL: 105 mg/dL

## 2018-01-08 NOTE — Assessment & Plan Note (Signed)
S: Patient is compliant with lisinopril.  We have discussed potentially changing this to CKD II if GFR remains at her above 60 over 2 year period.  A/P: He knows to avoid NSAIDs, he understands the importance of blood pressure and lipid control.  He understands the importance of not progressing to diabetes.  Update GFR today

## 2018-01-08 NOTE — Telephone Encounter (Signed)
Noted  

## 2018-01-08 NOTE — Telephone Encounter (Signed)
CRM created. 

## 2018-01-08 NOTE — Assessment & Plan Note (Signed)
S: 0 flares in a year on allopurinol 200 mg Lab Results  Component Value Date   LABURIC 6.5 10/07/2016  A/P: Update uric acid today

## 2018-01-08 NOTE — Patient Instructions (Addendum)
Please stop by lab before you go- uric acid from today plus all labs listed future from 04/13/17  If you can find shingrix at your pharmacy- can get this and send [place] a message   Mr. Kidd , Thank you for taking time to come for your Medicare Wellness Visit. I appreciate your ongoing commitment to your health goals. Please review the following plan we discussed and let me know if I can assist you in the future.   These are the goals we discussed: 1. Continue regular exercise- perhaps try to add 4th day a week 2. Focus on getting weight  Closer to 170 on our scales  This is a list of the screening recommended for you and due dates:  Health Maintenance  Topic Date Due  . Flu Shot  03/25/2018  . Tetanus Vaccine  04/05/2018  . Colon Cancer Screening  11/03/2023  .  Hepatitis C: One time screening is recommended by Center for Disease Control  (CDC) for  adults born from [birth year] through 1964.   Completed  . Pneumonia vaccines  Completed

## 2018-01-08 NOTE — Addendum Note (Signed)
Addended by: Felix Ahmadi A on: 01/08/2018 09:54 AM   Modules accepted: Orders

## 2018-01-08 NOTE — Telephone Encounter (Signed)
Amber spoke with Pt and added him to the lab schedule for Monday 01/11/18 to get redrawn.

## 2018-01-08 NOTE — Assessment & Plan Note (Signed)
S:  controlled on Lipitor 40 mg with last LDL under 100 Lab Results  Component Value Date   CHOL 164 10/07/2016   HDL 47.90 10/07/2016   LDLCALC 87 10/07/2016   LDLDIRECT 99.6 09/21/2013   TRIG 147.0 10/07/2016   CHOLHDL 3 10/07/2016   A/P:  Update lipids

## 2018-01-08 NOTE — Telephone Encounter (Signed)
Spoke with patient and he will come in on 01/11/2018 to have lab redrawn.

## 2018-01-08 NOTE — Assessment & Plan Note (Signed)
S: Patient continues to exercise Monday Wednesday Friday.  Last visit he was also been added Thursdays- he has done this occasionally.  Weight has been stable at last visit.  We did set a goal of getting closer to 170 on our scale Wt Readings from Last 3 Encounters:  01/08/18 179 lb 12.8 oz (81.6 kg)  04/13/17 178 lb (80.7 kg)  10/14/16 178 lb 12.8 oz (81.1 kg)    Lab Results  Component Value Date   HGBA1C 6.0 04/06/2017   HGBA1C 6.1 10/07/2016   HGBA1C 6.0 01/10/2016  A/P: Update A1c today

## 2018-01-08 NOTE — Assessment & Plan Note (Signed)
S: We saw a bump in PSA last year which came back down in 6 months.   Lab Results  Component Value Date   PSA 2.65 04/06/2017   PSA 3.32 10/07/2016   PSA 2.35 01/10/2016  A/P:We will update PSA today. BPH on exam

## 2018-01-08 NOTE — Assessment & Plan Note (Signed)
S: controlled on  lisinopril  in the past. Initial check high, better on repeat  BP Readings from Last 3 Encounters:  01/08/18 122/72  04/13/17 126/78  10/14/16 (!) 128/58  A/P: We discussed blood pressure goal of <140/90. Continue current meds

## 2018-01-08 NOTE — Progress Notes (Signed)
Phone: [phone]  Subjective:  Patient presents today for their annual wellness visit.    Preventive Screening-Counseling & Management  He has a living will and healthcare power of attorney.  His wife is his healthcare power of attorney.  He is full code. Would not want prolonged ventilator use   Smoking Status: Never Smoker Second Hand Smoking status: No smokers in home  Risk Factors Regular exercise: 3x a week for 60 minutes at [employer] Diet: For the most part he is maintaining his weight.  Reasonable diet.  May improve further as wife is entering a weight loss clinic   Fall Risk: None  Fall Risk  01/08/2018 10/14/2016 10/09/2015 10/03/2014 09/21/2013  Falls in the past year? No No No No No   Opioid use history: no long term opioids use  Cardiac risk factors:  advanced age (older than 29 for men, 23 for women)  Hyperlipidemia but on statin No diabetes. Prediabetes.  Blood pressure is controlled Family History: Yes infather at age 41 but he was a smoker.  Depression Screen None. PHQ2 0  Depression screen [facility] 2/9 01/08/2018 01/08/2018 10/14/2016 10/09/2015 10/03/2014  Decreased Interest 0 0 0 0 0  Down, Depressed, Hopeless 0 0 0 0 0  PHQ - 2 Score 0 0 0 0 0  Altered sleeping 0 - - - -  Tired, decreased energy 0 - - - -  Change in appetite 0 - - - -  Feeling bad or failure about yourself  0 - - - -  Trouble concentrating 0 - - - -  Moving slowly or fidgety/restless 0 - - - -  Suicidal thoughts 0 - - - -  PHQ-9 Score 0 - - - -    Activities of Daily Living Independent ADLs and IADLs   Hearing Difficulties: -patient declines  Cognitive Testing No reported trouble.   Normal 3 word recall  List the Names of Other Physician/Practitioners you currently use: -Dr. Nile Riggs may 2019 optho  - Dr. Marlou Porch if needed for kidney stones  Immunization History  Administered Date(s) Administered  . Influenza Split 06/05/2011, 05/06/2012  . Influenza Whole 08/25/2000,  05/31/2007, 05/17/2009, 06/06/2010  . Influenza, High Dose Seasonal PF 05/22/2016  . Influenza,inj,Quad PF,6+ Mos 08/09/2013, 05/11/2014, 05/24/2015  . Pneumococcal Conjugate-13 10/09/2015  . Pneumococcal Polysaccharide-23 05/11/2014  . Td 06/25/1998, 04/05/2008   Required Immunizations needed today - discussed getting Shingrix at pharmacy   Screening tests- up to date Had his colonoscopy 11/02/2013 with 10-year follow-up plan We are screening for prostate cancer with PSA.  May continue to trend next year given prior elevation in high baseline.  Does have BPH Encouraged regular sunscreen use. Denies changing or worrisome lesions. No dermatologist.   ROS- No pertinent positives discovered in course of AWV ROS pertinent- no chest pain, shortness of breath, blurry vision, headache, gout flares  The following were reviewed and entered/updated in epic: Past Medical History:  Diagnosis Date  . BPH associated with nocturia 10/03/2014  . Depression   . Hyperlipidemia   . Hypertension   . PSA, INCREASED 04/16/2010   History prostatitis x 2, # decreased with ciprofloxacin    . Rosacea    Patient Active Problem List   Diagnosis Date Noted  . Hyperglycemia 10/03/2014    Priority: Medium  . BPH associated with nocturia 10/03/2014    Priority: Medium  . Gout 06/21/2009    Priority: Medium  . Chronic kidney disease, stage III (moderate) (HCC) 04/05/2008    Priority: Medium  .  Hypertension 05/31/2007    Priority: Medium  . Hyperlipidemia 05/24/2007    Priority: Medium  . Seasonal allergies 05/11/2014    Priority: Low  . PSA, INCREASED 04/16/2010    Priority: Low  . ACNE ROSACEA 11/16/2008    Priority: Low  . DEPRESSION 05/24/2007    Priority: Low   Past Surgical History:  Procedure Laterality Date  . FLEXIBLE SIGMOIDOSCOPY    . TONSILLECTOMY     at around 42-9 yrs old  . WISDOM TOOTH EXTRACTION      Family History  Problem Relation Age of Onset  . Heart disease Father         MI 34, smoker  . Colon cancer Neg Hx   . Esophageal cancer Neg Hx   . Rectal cancer Neg Hx   . Stomach cancer Neg Hx     Medications- reviewed and updated Current Outpatient Medications  Medication Sig Dispense Refill  . allopurinol (ZYLOPRIM) 100 MG tablet Take two pills daily. 60 tablet 11  . aspirin 81 MG tablet Take 81 mg by mouth daily.      Marland Kitchen atorvastatin (LIPITOR) 40 MG tablet TAKE 1 TABLET (40 MG TOTAL) BY MOUTH DAILY. 90 tablet 2  . Biotin 5000 MCG TABS Take 1 tablet by mouth.    . Calcium Carbonate-Vitamin D (CALCIUM-VITAMIN D) 500-200 MG-UNIT tablet Take 1 tablet by mouth daily.    . cyanocobalamin 1000 MCG tablet Take 1,000 mcg by mouth daily.    . ferrous sulfate 325 (65 FE) MG tablet Take 325 mg by mouth daily with breakfast.    . folic acid (FOLVITE) 1 MG tablet Take 1 mg by mouth daily.      Marland Kitchen glucosamine-chondroitin 500-400 MG tablet Take 1 tablet by mouth 3 (three) times daily.    Marland Kitchen lisinopril (PRINIVIL,ZESTRIL) 5 MG tablet Take 1 tablet (5 mg total) by mouth daily. 90 tablet 1  . Multiple Vitamins-Minerals (CENTRUM SILVER PO) Take 1 tablet by mouth daily.     Marland Kitchen pyridoxine (B-6) 200 MG tablet Take 200 mg by mouth daily.       No current facility-administered medications for this visit.     Allergies-reviewed and updated No Known Allergies  Social History   Socioeconomic History  . Marital status: Married    Spouse name: Not on file  . Number of children: Not on file  . Years of education: Not on file  . Highest education level: Not on file  Occupational History  . Not on file  Social Needs  . Financial resource strain: Not on file  . Food insecurity:    Worry: Not on file    Inability: Not on file  . Transportation needs:    Medical: Not on file    Non-medical: Not on file  Tobacco Use  . Smoking status: Never Smoker  . Smokeless tobacco: Never Used  Substance and Sexual Activity  . Alcohol use: Yes    Alcohol/week: 0.0 oz    Comment: occas  .  Drug use: No  . Sexual activity: Not on file  Lifestyle  . Physical activity:    Days per week: Not on file    Minutes per session: Not on file  . Stress: Not on file  Relationships  . Social connections:    Talks on phone: Not on file    Gets together: Not on file    Attends religious service: Not on file    Active member of club or organization: Not on file  Attends meetings of clubs or organizations: Not on file    Relationship status: Not on file  Other Topics Concern  . Not on file  Social History Narrative   Married 1978. Son from first marriage [name] (1971-psychiatrist in [place]). No grandkids.    2 dogs-sheltie/collie mix and border collie mix      Retired from [employer] educational needs teacher      Hobbies: time with dogs, exercising   Objective: BP 122/72 (BP Location: Left Arm, Cuff Size: Large)   Pulse 65   Temp (!) 97.5 F (36.4 C) (Oral)   Ht  (1.651 m)   Wt 179 lb 12.8 oz (81.6 kg)   SpO2 96%   BMI 29.92 kg/m  Gen: NAD, resting comfortably HEENT: Mucous membranes are moist. Oropharynx normal Neck: no thyromegaly CV: RRR no murmurs rubs or gallops Lungs: CTAB no crackles, wheeze, rhonchi Abdomen: soft/nontender/nondistended/normal bowel sounds. No rebound or guarding. overweight Ext: no edema Skin: warm, dry Neuro: grossly normal, moves all extremities, PERRLA Rectal: normal tone, diffusely enlarged prostate, no masses or tenderness  Assessment/Plan:  AWV completed- discussed recommended screenings anddocumented any personalized health advice and referrals for preventive counseling. See AVS as well which was given to patient.   Status of chronic or acute concerns   Hypertension S: controlled on  lisinopril  in the past. Initial check high, better on repeat  BP Readings from Last 3 Encounters:  01/08/18 122/72  04/13/17 126/78  10/14/16 (!) 128/58  A/P: We discussed blood pressure goal of <140/90. Continue current  meds  Hyperlipidemia S:  controlled on Lipitor 40 mg with last LDL under 100 Lab Results  Component Value Date   CHOL 164 10/07/2016   HDL 47.90 10/07/2016   LDLCALC 87 10/07/2016   LDLDIRECT 99.6 09/21/2013   TRIG 147.0 10/07/2016   CHOLHDL 3 10/07/2016   A/P:  Update lipids  Hyperglycemia S: Patient continues to exercise Monday Wednesday Friday.  Last visit he was also been added Thursdays- he has done this occasionally.  Weight has been stable at last visit.  We did set a goal of getting closer to 170 on our scale Wt Readings from Last 3 Encounters:  01/08/18 179 lb 12.8 oz (81.6 kg)  04/13/17 178 lb (80.7 kg)  10/14/16 178 lb 12.8 oz (81.1 kg)    Lab Results  Component Value Date   HGBA1C 6.0 04/06/2017   HGBA1C 6.1 10/07/2016   HGBA1C 6.0 01/10/2016  A/P: Update A1c today  BPH associated with nocturia S: We saw a bump in PSA last year which came back down in 6 months.   Lab Results  Component Value Date   PSA 2.65 04/06/2017   PSA 3.32 10/07/2016   PSA 2.35 01/10/2016  A/P:We will update PSA today. BPH on exam  Chronic kidney disease, stage III (moderate) S: Patient is compliant with lisinopril.  We have discussed potentially changing this to CKD II if GFR remains at her above 60 over 2 year period.  A/P: He knows to avoid NSAIDs, he understands the importance of blood pressure and lipid control.  He understands the importance of not progressing to diabetes.  Update GFR today   Gout S: 0 flares in a year on allopurinol 200 mg Lab Results  Component Value Date   LABURIC 6.5 10/07/2016  A/P: Update uric acid today  Return in about 6 months (around 07/11/2018) for follow up- or sooner if needed.   Lab/Order associations: Labs ordered in august plus Idiopathic  gout of foot, unspecified chronicity, unspecified laterality - Plan: Uric acid  Return precautions advised.  Tana Conch, MD

## 2018-01-08 NOTE — Telephone Encounter (Signed)
[facility] blood sample had clotted too much to be able to run a CBC. Pt needs to return for a redraw at his convenience. Dr. Durene Cal notified.   I called Pt to attempt to schedule lab appt. Left message for Pt to return call on his home answering machine.   Pt does not need to be fasting for this lab draw.

## 2018-01-08 NOTE — Addendum Note (Signed)
Addended by: Heide Spark M on: 01/08/2018 03:15 PM   Modules accepted: Orders

## 2018-01-09 NOTE — Progress Notes (Signed)
Cholesterol has slipped up some. Bad cholesterol over goal of 100 and triglycerides over goal of 200. I know you are exercising regularly- perhaps you can go to visits with your wife to see if there are some ways you can improve on your diet. I dont feel strongly about putting you on max dose statin with how close you are to goal.   Your PSA has gone back up again. Team lets order another PSA within 2-4 weeks under elevated PSA- if remains high would refer to urology  Uric acid looks good under 6  Kidneys stable from 2 years ago but slightly worse than last few checks. Blood sugar remains at risk for diabetes. Liver looks good  Continue to remain at risk for diabetes but risk is lower with your excellent exercise patterns- keep up the good work!

## 2018-01-11 ENCOUNTER — Other Ambulatory Visit (INDEPENDENT_AMBULATORY_CARE_PROVIDER_SITE_OTHER): Payer: Medicare Other

## 2018-01-11 DIAGNOSIS — E785 Hyperlipidemia, unspecified: Secondary | ICD-10-CM

## 2018-01-11 LAB — CBC
HCT: 43.7 % (ref 39.0–52.0)
Hemoglobin: 14.9 g/dL (ref 13.0–17.0)
MCHC: 34 g/dL (ref 30.0–36.0)
MCV: 91.2 fl (ref 78.0–100.0)
Platelets: 238 10*3/uL (ref 150.0–400.0)
RBC: 4.79 Mil/uL (ref 4.22–5.81)
RDW: 13.7 % (ref 11.5–15.5)
WBC: 5.8 10*3/uL (ref 4.0–10.5)

## 2018-04-02 ENCOUNTER — Other Ambulatory Visit: Payer: Self-pay

## 2018-04-02 MED ORDER — ATORVASTATIN CALCIUM 40 MG PO TABS: ORAL_TABLET | ORAL | 2 refills | Status: DC

## 2018-06-03 ENCOUNTER — Other Ambulatory Visit: Payer: Self-pay

## 2018-06-03 MED ORDER — ALLOPURINOL 100 MG PO TABS: ORAL_TABLET | ORAL | 11 refills | Status: DC

## 2018-07-03 DIAGNOSIS — Z23 Encounter for immunization: Secondary | ICD-10-CM | POA: Diagnosis not present

## 2018-07-15 ENCOUNTER — Ambulatory Visit (INDEPENDENT_AMBULATORY_CARE_PROVIDER_SITE_OTHER): Payer: Medicare Other | Admitting: Family Medicine

## 2018-07-15 ENCOUNTER — Encounter: Payer: Self-pay | Admitting: Family Medicine

## 2018-07-15 VITALS — BP 124/70 | HR 66 | Temp 98.7°F | Ht 65.0 in | Wt 173.0 lb

## 2018-07-15 DIAGNOSIS — Z23 Encounter for immunization: Secondary | ICD-10-CM | POA: Diagnosis not present

## 2018-07-15 DIAGNOSIS — M10079 Idiopathic gout, unspecified ankle and foot: Secondary | ICD-10-CM

## 2018-07-15 DIAGNOSIS — I10 Essential (primary) hypertension: Secondary | ICD-10-CM

## 2018-07-15 DIAGNOSIS — R739 Hyperglycemia, unspecified: Secondary | ICD-10-CM

## 2018-07-15 DIAGNOSIS — N183 Chronic kidney disease, stage 3 unspecified: Secondary | ICD-10-CM

## 2018-07-15 DIAGNOSIS — E785 Hyperlipidemia, unspecified: Secondary | ICD-10-CM | POA: Diagnosis not present

## 2018-07-15 DIAGNOSIS — Z6828 Body mass index (BMI) 28.0-28.9, adult: Secondary | ICD-10-CM

## 2018-07-15 LAB — HEMOGLOBIN A1C: Hgb A1c MFr Bld: 5.8 % (ref 4.6–6.5)

## 2018-07-15 LAB — BASIC METABOLIC PANEL
BUN: 25 mg/dL — ABNORMAL HIGH (ref 6–23)
CHLORIDE: 104 meq/L (ref 96–112)
CO2: 28 meq/L (ref 19–32)
Calcium: 9.3 mg/dL (ref 8.4–10.5)
Creatinine, Ser: 1.35 mg/dL (ref 0.40–1.50)
GFR: 55.39 mL/min — ABNORMAL LOW (ref >60.00)
Glucose, Bld: 100 mg/dL — ABNORMAL HIGH (ref 70–99)
Potassium: 5 mEq/L (ref 3.5–5.1)
SODIUM: 140 meq/L (ref 135–145)

## 2018-07-15 LAB — TRIGLYCERIDES: TRIGLYCERIDES: 189 mg/dL — AB (ref 0.0–149.0)

## 2018-07-15 LAB — LDL CHOLESTEROL, DIRECT: LDL DIRECT: 96 mg/dL

## 2018-07-15 MED ORDER — TETANUS-DIPHTH-ACELL PERTUSSIS 5-2.5-18.5 LF-MCG/0.5 IM SUSP: 0.5000 mL | Freq: Once | INTRAMUSCULAR | 0 refills | Status: AC

## 2018-07-15 MED ORDER — ATORVASTATIN CALCIUM 40 MG PO TABS: ORAL_TABLET | ORAL | 3 refills | Status: DC

## 2018-07-15 MED ORDER — LISINOPRIL 5 MG PO TABS: 5.0000 mg | ORAL_TABLET | Freq: Every day | ORAL | 3 refills | Status: DC

## 2018-07-15 NOTE — Assessment & Plan Note (Signed)
S: Patient remains compliant with lisinopril.  He knows to avoid NSAIDs.  Recently filtration rate has hung around 60-most recently was at 58 A/P: Hopefully stable-get updated bmet

## 2018-07-15 NOTE — Patient Instructions (Addendum)
Health Maintenance Due  Topic Date Due  . TETANUS/TDAP  Patient given script to have at pharmacy  04/05/2018   You are due for your Shingles vaccination as well (Shingrix). You can get this at the pharmacy.   Please stop by lab before you go

## 2018-07-15 NOTE — Assessment & Plan Note (Signed)
S: 0  flares in a few years on allopurinol 200 mg.  Uric acid has been below goal of 6 or less Lab Results  Component Value Date   LABURIC 5.8 01/08/2018  A/P: stable- continue allopurinol- skip uric acid as has done well

## 2018-07-15 NOTE — Assessment & Plan Note (Signed)
S: At risk for diabetes but with diet/exercise has been able to keep A1c under 6 most recently Exercise and diet- continues to exercise Monday Wednesday Friday.  Has done a great job and lost 6 pounds from last visit-hopeful A1c has improved. Wife has been highly motivated and that's helping him as well- he has been more conscious of what he is eating Lab Results  Component Value Date   HGBA1C 5.9 01/08/2018   HGBA1C 6.0 04/06/2017   HGBA1C 6.1 10/07/2016   A/P: Hopefully improve-get updated A1c

## 2018-07-15 NOTE — Assessment & Plan Note (Signed)
S: Well controlled on atorvastatin 40 mg Lab Results  Component Value Date   CHOL 182 01/08/2018   HDL 48.00 01/08/2018   LDLCALC 87 10/07/2016   LDLDIRECT 105.0 01/08/2018   TRIG 208.0 (H) 01/08/2018   CHOLHDL 4 01/08/2018   A/P: Mild elevations earlier this year with LDL just above 100-continue to work on lifestyle modification-he has done a great job and lost 6 pounds since last visit-we will update direct LDL and triglycerides

## 2018-07-15 NOTE — Assessment & Plan Note (Signed)
S: controlled on lisinopril 5 mg BP Readings from Last 3 Encounters:  07/15/18 124/70  01/08/18 122/72  04/13/17 126/78  A/P: We discussed blood pressure goal of <140/90. Continue current meds

## 2018-07-15 NOTE — Progress Notes (Signed)
Subjective:  Robert Gaines is a 72 y.o. year old very pleasant male patient who presents for/with See problem oriented charting ROS- No chest pain or shortness of breath. No headache or blurry vision.    Past Medical History-  Patient Active Problem List   Diagnosis Date Noted  . Hyperglycemia 10/03/2014    Priority: Medium  . BPH associated with nocturia 10/03/2014    Priority: Medium  . Gout 06/21/2009    Priority: Medium  . Chronic kidney disease, stage III (moderate) (HCC) 04/05/2008    Priority: Medium  . Hypertension 05/31/2007    Priority: Medium  . Hyperlipidemia 05/24/2007    Priority: Medium  . Seasonal allergies 05/11/2014    Priority: Low  . PSA, INCREASED 04/16/2010    Priority: Low  . ACNE ROSACEA 11/16/2008    Priority: Low  . DEPRESSION 05/24/2007    Priority: Low    Medications- reviewed and updated Current Outpatient Medications  Medication Sig Dispense Refill  . allopurinol (ZYLOPRIM) 100 MG tablet Take two pills daily. 60 tablet 11  . aspirin 81 MG tablet Take 81 mg by mouth daily.      Marland Kitchen atorvastatin (LIPITOR) 40 MG tablet TAKE 1 TABLET (40 MG TOTAL) BY MOUTH DAILY. 90 tablet 3  . Biotin 5000 MCG TABS Take 1 tablet by mouth.    . Calcium Carbonate-Vitamin D (CALCIUM-VITAMIN D) 500-200 MG-UNIT tablet Take 1 tablet by mouth daily.    . cyanocobalamin 1000 MCG tablet Take 1,000 mcg by mouth daily.    . ferrous sulfate 325 (65 FE) MG tablet Take 325 mg by mouth daily with breakfast.    . folic acid (FOLVITE) 1 MG tablet Take 1 mg by mouth daily.      Marland Kitchen glucosamine-chondroitin 500-400 MG tablet Take 1 tablet by mouth 3 (three) times daily.    Marland Kitchen lisinopril (PRINIVIL,ZESTRIL) 5 MG tablet Take 1 tablet (5 mg total) by mouth daily. 90 tablet 3  . Multiple Vitamins-Minerals (CENTRUM SILVER PO) Take 1 tablet by mouth daily.     Marland Kitchen pyridoxine (B-6) 200 MG tablet Take 200 mg by mouth daily.      . Tdap (BOOSTRIX) 5-2.5-18.5 LF-MCG/0.5 injection Inject 0.5 mLs into  the muscle once for 1 dose. 0.5 mL 0   No current facility-administered medications for this visit.     Objective: BP 124/70   Pulse 66   Temp 98.7 F (37.1 C) (Oral)   Ht 5\' 5"  (1.651 m)   Wt 173 lb (78.5 kg)   SpO2 98%   BMI 28.79 kg/m   Gen: NAD, resting comfortably CV: RRR no murmurs rubs or gallops Lungs: CTAB no crackles, wheeze, rhonchi Abdomen: soft/nontender/nondistended/normal bowel sounds.  Ext: no edema Skin: warm, dry Neuro: grossly normal, moves all extremities  Assessment/Plan:  Hyperglycemia S: At risk for diabetes but with diet/exercise has been able to keep A1c under 6 most recently Exercise and diet- continues to exercise Monday Wednesday Friday.  Has done a great job and lost 6 pounds from last visit-hopeful A1c has improved. Wife has been highly motivated and that's helping him as well- he has been more conscious of what he is eating Lab Results  Component Value Date   HGBA1C 5.9 01/08/2018   HGBA1C 6.0 04/06/2017   HGBA1C 6.1 10/07/2016   A/P: Hopefully improve-get updated A1c  Hyperlipidemia S: Well controlled on atorvastatin 40 mg Lab Results  Component Value Date   CHOL 182 01/08/2018   HDL 48.00 01/08/2018   LDLCALC  87 10/07/2016   LDLDIRECT 105.0 01/08/2018   TRIG 208.0 (H) 01/08/2018   CHOLHDL 4 01/08/2018   A/P: Mild elevations earlier this year with LDL just above 100-continue to work on lifestyle modification-he has done a great job and lost 6 pounds since last visit-we will update direct LDL and triglycerides   Hypertension S: controlled on lisinopril 5 mg BP Readings from Last 3 Encounters:  07/15/18 124/70  01/08/18 122/72  04/13/17 126/78  A/P: We discussed blood pressure goal of <140/90. Continue current meds  Gout S: 0  flares in a few years on allopurinol 200 mg.  Uric acid has been below goal of 6 or less Lab Results  Component Value Date   LABURIC 5.8 01/08/2018  A/P: stable- continue allopurinol- skip uric acid as  has done well   Chronic kidney disease, stage III (moderate) S: Patient remains compliant with lisinopril.  He knows to avoid NSAIDs.  Recently filtration rate has hung around 60-most recently was at 58 A/P: Hopefully stable-get updated bmet  3550-month follow-up planned  Lab/Order associations: FASTING  Hyperlipidemia, unspecified hyperlipidemia type - Plan: LDL cholesterol, direct, Triglycerides  Essential hypertension - Plan: Basic metabolic panel  Hyperglycemia - Plan: Hemoglobin A1c  Chronic kidney disease, stage III (moderate) (HCC) - Plan: Basic metabolic panel  Idiopathic gout of foot, unspecified chronicity, unspecified laterality  Body mass index 28.0-28.9, adult  Need for Tdap vaccination - Plan: Tdap (BOOSTRIX) 5-2.5-18.5 LF-MCG/0.5 injection  Meds ordered this encounter  Medications  . Tdap (BOOSTRIX) 5-2.5-18.5 LF-MCG/0.5 injection    Sig: Inject 0.5 mLs into the muscle once for 1 dose.    Dispense:  0.5 mL    Refill:  0  . atorvastatin (LIPITOR) 40 MG tablet    Sig: TAKE 1 TABLET (40 MG TOTAL) BY MOUTH DAILY.    Dispense:  90 tablet    Refill:  3  . lisinopril (PRINIVIL,ZESTRIL) 5 MG tablet    Sig: Take 1 tablet (5 mg total) by mouth daily.    Dispense:  90 tablet    Refill:  3    Return precautions advised.  Tana ConchStephen , MD

## 2018-07-20 ENCOUNTER — Encounter: Payer: Self-pay | Admitting: Family Medicine

## 2019-01-06 DIAGNOSIS — H2513 Age-related nuclear cataract, bilateral: Secondary | ICD-10-CM | POA: Diagnosis not present

## 2019-01-20 ENCOUNTER — Encounter: Payer: Self-pay | Admitting: Family Medicine

## 2019-01-20 ENCOUNTER — Ambulatory Visit (INDEPENDENT_AMBULATORY_CARE_PROVIDER_SITE_OTHER): Payer: Medicare Other | Admitting: Family Medicine

## 2019-01-20 VITALS — Ht 65.0 in | Wt 172.0 lb

## 2019-01-20 DIAGNOSIS — R972 Elevated prostate specific antigen [PSA]: Secondary | ICD-10-CM | POA: Diagnosis not present

## 2019-01-20 DIAGNOSIS — M10079 Idiopathic gout, unspecified ankle and foot: Secondary | ICD-10-CM

## 2019-01-20 DIAGNOSIS — I10 Essential (primary) hypertension: Secondary | ICD-10-CM

## 2019-01-20 DIAGNOSIS — E785 Hyperlipidemia, unspecified: Secondary | ICD-10-CM | POA: Diagnosis not present

## 2019-01-20 DIAGNOSIS — R739 Hyperglycemia, unspecified: Secondary | ICD-10-CM | POA: Diagnosis not present

## 2019-01-20 LAB — CBC
HCT: 41.7 % (ref 39.0–52.0)
Hemoglobin: 14.6 g/dL (ref 13.0–17.0)
MCHC: 35 g/dL (ref 30.0–36.0)
MCV: 90.3 fl (ref 78.0–100.0)
Platelets: 221 10*3/uL (ref 150.0–400.0)
RBC: 4.62 Mil/uL (ref 4.22–5.81)
RDW: 13.3 % (ref 11.5–15.5)
WBC: 5.6 10*3/uL (ref 4.0–10.5)

## 2019-01-20 LAB — COMPREHENSIVE METABOLIC PANEL
ALT: 23 U/L (ref 0–53)
AST: 20 U/L (ref 0–37)
Albumin: 4.4 g/dL (ref 3.5–5.2)
Alkaline Phosphatase: 37 U/L — ABNORMAL LOW (ref 39–117)
BUN: 25 mg/dL — ABNORMAL HIGH (ref 6–23)
CO2: 30 mEq/L (ref 19–32)
Calcium: 9.3 mg/dL (ref 8.4–10.5)
Chloride: 103 mEq/L (ref 96–112)
Creatinine, Ser: 1.23 mg/dL (ref 0.40–1.50)
GFR: 57.93 mL/min — ABNORMAL LOW (ref >60.00)
Glucose, Bld: 96 mg/dL (ref 70–99)
Potassium: 4.7 mEq/L (ref 3.5–5.1)
Sodium: 140 mEq/L (ref 135–145)
Total Bilirubin: 0.5 mg/dL (ref 0.2–1.2)
Total Protein: 6.5 g/dL (ref 6.0–8.3)

## 2019-01-20 LAB — LIPID PANEL
Cholesterol: 183 mg/dL (ref 0–200)
HDL: 46 mg/dL (ref >39.00)
NonHDL: 137.42
Total CHOL/HDL Ratio: 4
Triglycerides: 224 mg/dL — ABNORMAL HIGH (ref 0.0–149.0)
VLDL: 44.8 mg/dL — ABNORMAL HIGH (ref 0.0–40.0)

## 2019-01-20 LAB — LDL CHOLESTEROL, DIRECT: Direct LDL: 96 mg/dL

## 2019-01-20 LAB — PSA: PSA: 3.53 ng/mL (ref 0.10–4.00)

## 2019-01-20 LAB — URIC ACID: Uric Acid, Serum: 5.6 mg/dL (ref 4.0–7.8)

## 2019-01-20 LAB — HEMOGLOBIN A1C: Hgb A1c MFr Bld: 6.1 % (ref 4.6–6.5)

## 2019-01-20 NOTE — Patient Instructions (Addendum)
Health Maintenance Due  Topic Date Due  . TETANUS/TDAP PT stated that he went to [facility] and the cost was $68 pt stated he not sure if he wants to pay that  04/05/2018

## 2019-01-20 NOTE — Progress Notes (Signed)
Phone [phone]   Subjective:  Virtual visit via Video note. Chief complaint: Chief Complaint  Patient presents with  . Hyperlipidemia    This visit type was conducted due to national recommendations for restrictions regarding the COVID-19 Pandemic (e.g. social distancing).  This format is felt to be most appropriate for this patient at this time balancing risks to patient and risks to population by having him in for in person visit.  No physical exam was performed (except for noted visual exam or audio findings with Telehealth visits).    Our team/I connected with Burna Mortimer Twedt at  8:40 AM EDT by a video enabled telemedicine application ([email or web address] or caregility through epic) and verified that I am speaking with the correct person using two identifiers.  Location patient: [place] Location provider: [facility], office Persons participating in the virtual visit:  patient  Our team/I discussed the limitations of evaluation and management by telemedicine and the availability of in person appointments. In light of current covid-19 pandemic, patient also understands that we are trying to protect them by minimizing in office contact if at all possible.  The patient expressed consent for telemedicine visit and agreed to proceed. Patient understands insurance will be billed.   ROS- No fever, chills, cough, shortness of breath, body aches, sore throat, or loss of taste or smell  Past Medical History-  Patient Active Problem List   Diagnosis Date Noted  . Hyperglycemia 10/03/2014    Priority: Medium  . BPH associated with nocturia 10/03/2014    Priority: Medium  . Gout 06/21/2009    Priority: Medium  . Chronic kidney disease, stage III (moderate) (HCC) 04/05/2008    Priority: Medium  . Hypertension 05/31/2007    Priority: Medium  . Hyperlipidemia 05/24/2007    Priority: Medium  . Seasonal allergies 05/11/2014    Priority: Low  . PSA, INCREASED 04/16/2010    Priority: Low  . ACNE  ROSACEA 11/16/2008    Priority: Low  . DEPRESSION 05/24/2007    Priority: Low    Medications- reviewed and updated Current Outpatient Medications  Medication Sig Dispense Refill  . allopurinol (ZYLOPRIM) 100 MG tablet Take two pills daily. 60 tablet 11  . aspirin 81 MG tablet Take 81 mg by mouth daily.      Marland Kitchen atorvastatin (LIPITOR) 40 MG tablet TAKE 1 TABLET (40 MG TOTAL) BY MOUTH DAILY. 90 tablet 3  . Biotin 5000 MCG TABS Take 1 tablet by mouth.    . Calcium Carbonate-Vitamin D (CALCIUM-VITAMIN D) 500-200 MG-UNIT tablet Take 1 tablet by mouth daily.    . cyanocobalamin 1000 MCG tablet Take 1,000 mcg by mouth daily.    . ferrous sulfate 325 (65 FE) MG tablet Take 325 mg by mouth daily with breakfast.    . glucosamine-chondroitin 500-400 MG tablet Take 1 tablet by mouth 3 (three) times daily.    Marland Kitchen lisinopril (PRINIVIL,ZESTRIL) 5 MG tablet Take 1 tablet (5 mg total) by mouth daily. 90 tablet 3  . Multiple Vitamins-Minerals (CENTRUM SILVER PO) Take 1 tablet by mouth daily.     Marland Kitchen pyridoxine (B-6) 200 MG tablet Take 200 mg by mouth daily.       No current facility-administered medications for this visit.      Objective:  Ht 5\' 5"  (1.651 m)   Wt 172 lb (78 kg)   BMI 28.62 kg/m  self reported vitals Gen: NAD, resting comfortably Lungs: nonlabored, normal respiratory rate  Skin: appears dry, no obvious rash  Assessment and Plan    # overweight/ hyperglycemia/prediabetes S: trying to walk around the neighborhood with covid 19 but misses the gym. Has been able to maintain weight- eating a healthier diet though his scale is a few lbs less than he would be here Wt Readings from Last 3 Encounters:  01/20/19 172 lb (78 kg)  07/15/18 173 lb (78.5 kg)  01/08/18 179 lb 12.8 oz (81.6 kg)   Lab Results  Component Value Date   HGBA1C 5.8 07/15/2018  Wife's diet has rubbed off on him and helped him  A/P: for prediabetes- update a1c and continue weight loss efforts For overweight-  Encouraged need for healthy eating, regular exercise, weight loss.    #hypertension/CKD III S: controlled on  lisinopril 5 mg in the past. Does not have home cuff  GFR has been stable in 50s. Ace I in case proteinuric element BP Readings from Last 3 Encounters:  07/15/18 124/70  01/08/18 122/72  04/13/17 126/78  A/P: likely stable blood pressure- he is going to come by for labs to make sure CKD III stable and nursing staff will check a blood pressure  #hyperlipidemia S:  controlled on atorvastatin 40mg  in the past Lab Results  Component Value Date   CHOL 182 01/08/2018   HDL 48.00 01/08/2018   LDLCALC 87 10/07/2016   LDLDIRECT 96.0 07/15/2018   TRIG 189.0 (H) 07/15/2018   CHOLHDL 4 01/08/2018   A/P: hopefully stable- update lipid panel with labs today  #Gout S: 0 flares in last 6 months on allopurinol 200mg  per day Lab Results  Component Value Date   LABURIC 5.8 01/08/2018  A/P: Stable. Continue current medications.  Update uric acid  #elevated PSA- our team was supposed to set up repeat PSA after last years visit- this was not completed and I did not order at his November visit either- we will have him by today and if trending up still- likely refer to urology Lab Results  Component Value Date   PSA 4.14 (H) 01/08/2018   PSA 2.65 04/06/2017   PSA 3.32 10/07/2016   Other notes: 1.Tdap $68 at pharmacy- he wants to hold off unless cut/scrape and will get it here  Will plan to recommend 6 month follow up after we get labs back Lab/Order associations: fasting Hyperlipidemia, unspecified hyperlipidemia type - Plan: CBC, Comprehensive metabolic panel, Lipid panel  Essential hypertension - Plan: CBC, Comprehensive metabolic panel  Idiopathic gout of foot, unspecified chronicity, unspecified laterality - Plan: Uric acid  Elevated PSA - Plan: PSA  Hyperglycemia - Plan: Hemoglobin A1c  Return precautions advised.  Tana ConchStephen Cindra Austad, MD

## 2019-01-21 ENCOUNTER — Other Ambulatory Visit: Payer: Self-pay

## 2019-01-21 DIAGNOSIS — E785 Hyperlipidemia, unspecified: Secondary | ICD-10-CM

## 2019-04-20 DIAGNOSIS — Z23 Encounter for immunization: Secondary | ICD-10-CM | POA: Diagnosis not present

## 2019-07-06 ENCOUNTER — Other Ambulatory Visit: Payer: Self-pay

## 2019-07-06 MED ORDER — ALLOPURINOL 100 MG PO TABS: ORAL_TABLET | ORAL | 11 refills | Status: DC

## 2019-08-08 ENCOUNTER — Other Ambulatory Visit: Payer: Self-pay

## 2019-08-09 ENCOUNTER — Ambulatory Visit (INDEPENDENT_AMBULATORY_CARE_PROVIDER_SITE_OTHER): Payer: Medicare Other | Admitting: Family Medicine

## 2019-08-09 ENCOUNTER — Encounter: Payer: Self-pay | Admitting: Family Medicine

## 2019-08-09 VITALS — BP 126/70 | HR 66 | Temp 97.6°F | Ht 65.0 in | Wt 174.0 lb

## 2019-08-09 DIAGNOSIS — N183 Chronic kidney disease, stage 3 unspecified: Secondary | ICD-10-CM | POA: Diagnosis not present

## 2019-08-09 DIAGNOSIS — R972 Elevated prostate specific antigen [PSA]: Secondary | ICD-10-CM | POA: Diagnosis not present

## 2019-08-09 DIAGNOSIS — R739 Hyperglycemia, unspecified: Secondary | ICD-10-CM

## 2019-08-09 DIAGNOSIS — E785 Hyperlipidemia, unspecified: Secondary | ICD-10-CM

## 2019-08-09 DIAGNOSIS — I10 Essential (primary) hypertension: Secondary | ICD-10-CM | POA: Diagnosis not present

## 2019-08-09 LAB — PSA: PSA: 4.4 ng/mL — ABNORMAL HIGH (ref 0.10–4.00)

## 2019-08-09 LAB — COMPREHENSIVE METABOLIC PANEL
ALT: 24 U/L (ref 0–53)
AST: 23 U/L (ref 0–37)
Albumin: 4.7 g/dL (ref 3.5–5.2)
Alkaline Phosphatase: 37 U/L — ABNORMAL LOW (ref 39–117)
BUN: 22 mg/dL (ref 6–23)
CO2: 28 mEq/L (ref 19–32)
Calcium: 9.5 mg/dL (ref 8.4–10.5)
Chloride: 103 mEq/L (ref 96–112)
Creatinine, Ser: 1.22 mg/dL (ref 0.40–1.50)
GFR: 58.39 mL/min — ABNORMAL LOW (ref >60.00)
Glucose, Bld: 105 mg/dL — ABNORMAL HIGH (ref 70–99)
Potassium: 4.5 mEq/L (ref 3.5–5.1)
Sodium: 139 mEq/L (ref 135–145)
Total Bilirubin: 0.6 mg/dL (ref 0.2–1.2)
Total Protein: 6.7 g/dL (ref 6.0–8.3)

## 2019-08-09 LAB — HEMOGLOBIN A1C: Hgb A1c MFr Bld: 5.8 % (ref 4.6–6.5)

## 2019-08-09 MED ORDER — LISINOPRIL 5 MG PO TABS: 5.0000 mg | ORAL_TABLET | Freq: Every day | ORAL | 3 refills | Status: DC

## 2019-08-09 MED ORDER — ATORVASTATIN CALCIUM 40 MG PO TABS: ORAL_TABLET | ORAL | 3 refills | Status: DC

## 2019-08-09 NOTE — Progress Notes (Signed)
Phone [phone] In person visit   Subjective:   Robert Gaines is a 73 y.o. year old very pleasant male patient who presents for/with See problem oriented charting Chief Complaint  Patient presents with  . Follow-up    ROS- Review of Systems  Constitutional: Negative.   HENT: Negative.   Eyes: Negative.   Respiratory: Negative.   Cardiovascular: Negative.   Gastrointestinal: Negative.   Genitourinary: Negative.   Musculoskeletal: Negative.   Skin: Negative.   Neurological: Negative.   Endo/Heme/Allergies: Negative.   Psychiatric/Behavioral: Negative.    This visit occurred during the SARS-CoV-2 public health emergency.  Safety protocols were in place, including screening questions prior to the visit, additional usage of staff PPE, and extensive cleaning of exam room while observing appropriate contact time as indicated for disinfecting solutions.   Past Medical History-  Patient Active Problem List   Diagnosis Date Noted  . Hyperglycemia 10/03/2014    Priority: Medium  . BPH associated with nocturia 10/03/2014    Priority: Medium  . Gout 06/21/2009    Priority: Medium  . Chronic kidney disease, stage III (moderate) 04/05/2008    Priority: Medium  . Hypertension 05/31/2007    Priority: Medium  . Hyperlipidemia 05/24/2007    Priority: Medium  . Seasonal allergies 05/11/2014    Priority: Low  . PSA, INCREASED 04/16/2010    Priority: Low  . DEPRESSION 05/24/2007    Priority: Low    Medications- reviewed and updated Current Outpatient Medications  Medication Sig Dispense Refill  . allopurinol (ZYLOPRIM) 100 MG tablet Take two pills daily. 60 tablet 11  . aspirin 81 MG tablet Take 81 mg by mouth daily.      Marland Kitchen atorvastatin (LIPITOR) 40 MG tablet TAKE 1 TABLET (40 MG TOTAL) BY MOUTH DAILY. 90 tablet 3  . Biotin 5000 MCG TABS Take 1 tablet by mouth.    . Calcium Carbonate-Vitamin D (CALCIUM-VITAMIN D) 500-200 MG-UNIT tablet Take 1 tablet by mouth daily.    .  cyanocobalamin 1000 MCG tablet Take 1,000 mcg by mouth daily.    . ferrous sulfate 325 (65 FE) MG tablet Take 325 mg by mouth daily with breakfast.    . glucosamine-chondroitin 500-400 MG tablet Take 1 tablet by mouth 3 (three) times daily.    Marland Kitchen lisinopril (ZESTRIL) 5 MG tablet Take 1 tablet (5 mg total) by mouth daily. 90 tablet 3  . Multiple Vitamins-Minerals (CENTRUM SILVER PO) Take 1 tablet by mouth daily.      No current facility-administered medications for this visit.     Objective:  BP 126/70   Pulse 66   Temp 97.6 F (36.4 C) (Temporal)   Ht 5\' 5"  (1.651 m)   Wt 174 lb (78.9 kg)   SpO2 97%   BMI 28.96 kg/m  Gen: NAD, resting comfortably CV: RRR no murmurs rubs or gallops Lungs: CTAB no crackles, wheeze, rhonchi Abdomen: soft/nontender/nondistended/normal bowel sounds. No rebound or guarding.  Ext: no edema Skin: warm, dry Neuro: grossly normal, moves all extremities     Assessment and Plan   # overweight/ hyperglycemia/prediabetes S: back to gym 3 days a week 1.5- 2 hours. Has been able to maintain weight for the most part-only up 1 pounds from last year- has been motivated by his wife  Lab Results  Component Value Date   HGBA1C 6.1 01/20/2019   HGBA1C 5.8 07/15/2018   HGBA1C 5.9 01/08/2018  A/P: for prediabetes/hyperglycemia-  update A1c today-we discussed potentially adding Metformin- he wants to hold  off unless gets closer to 6.4  For overweight - Encouraged need for continued healthy eating, regular exercise, weight loss.    #hypertension/CKD III S: compliant with lisinopril 5mg    GFR has been stable in 50s. Ace I in case proteinuric element .  He knows to avoid NSAIDs  A/P:  Stable. Continue current medications.    #hyperlipidemia S:  Compliant with atorvastatin 40mg   Lab Results  Component Value Date   CHOL 183 01/20/2019   HDL 46.00 01/20/2019   LDLCALC 87 10/07/2016   LDLDIRECT 96.0 01/20/2019   TRIG 224.0 (H) 01/20/2019   CHOLHDL 4 01/20/2019     A/P: Reasonable control in May with LDL 96-we discussed newer guidelines with LDL goal 70 or less.  I do not feel strongly about increasing statin dosage as may increase diabetes risk  #Gout S: 0 flares in last 6 months on allopurinol 200mg  per day  Lab Results  Component Value Date   LABURIC 5.6 01/20/2019   A/P: Stable. Continue current medications.    #elevated PSA-number last visit trended back down-similar to 3.32 February 2018. Lab Results  Component Value Date   PSA 3.53 01/20/2019   PSA 4.14 (H) 01/08/2018   PSA 2.65 04/06/2017   Recommended follow up: Return in about 6 months (around 02/07/2020) for physical or sooner if needed. we can try to schedule wellness visit at same time. .  Lab/Order associations:   ICD-10-CM   1. Hyperglycemia  R73.9 Hemoglobin A1c  2. Essential hypertension  I10 Comprehensive metabolic panel  3. Stage 3 chronic kidney disease, unspecified whether stage 3a or 3b CKD  N18.30   4. Hyperlipidemia, unspecified hyperlipidemia type  E78.5   5. Elevated PSA  R97.20 PSA    Meds ordered this encounter  Medications  . lisinopril (ZESTRIL) 5 MG tablet    Sig: Take 1 tablet (5 mg total) by mouth daily.    Dispense:  90 tablet    Refill:  3  . atorvastatin (LIPITOR) 40 MG tablet    Sig: TAKE 1 TABLET (40 MG TOTAL) BY MOUTH DAILY.    Dispense:  90 tablet    Refill:  3    Return precautions advised.  Garret Reddish, MD

## 2019-08-09 NOTE — Patient Instructions (Addendum)
Please stop by lab before you go If you do not have mychart- we will call you about results within 5 business days of [place] receiving them.  If you have mychart- we will send your results within 3 business days of [place] receiving them.  If abnormal or we want to clarify a result, we will call or mychart you to make sure you receive the message.  If you have questions or concerns or don't hear within 5-7 days, please send [place] a message or call us.   No changes in meds otday  Recommended follow up: Return in about 6 months (around 02/07/2020) for physical or sooner if needed.

## 2019-08-10 ENCOUNTER — Other Ambulatory Visit: Payer: Self-pay

## 2019-08-10 DIAGNOSIS — R972 Elevated prostate specific antigen [PSA]: Secondary | ICD-10-CM

## 2019-10-16 ENCOUNTER — Ambulatory Visit: Payer: Medicare PPO | Attending: Internal Medicine

## 2019-10-16 DIAGNOSIS — Z23 Encounter for immunization: Secondary | ICD-10-CM

## 2019-10-16 NOTE — Progress Notes (Signed)
   Covid-19 Vaccination Clinic  Name:  Robert Gaines    MRN: [id number] DOB: 07-02-1946  10/16/2019  [name] was observed post Covid-19 immunization for 15 minutes without incidence. He was provided with Vaccine Information Sheet and instruction to access the V-Safe system.   [name] was instructed to call 911 with any severe reactions post vaccine: Marland Kitchen Difficulty breathing  . Swelling of your face and throat  . A fast heartbeat  . A bad rash all over your body  . Dizziness and weakness    Immunizations Administered    Name Date Dose VIS Date Route   Pfizer COVID-19 Vaccine 10/16/2019  2:50 PM 0.3 mL 08/05/2019 Intramuscular   Manufacturer: [employer], [employer]   Lot: [id number]   NDC: [address]-6

## 2019-11-09 ENCOUNTER — Ambulatory Visit: Payer: Medicare PPO | Attending: Internal Medicine

## 2019-11-09 DIAGNOSIS — Z23 Encounter for immunization: Secondary | ICD-10-CM

## 2019-11-09 NOTE — Progress Notes (Signed)
   Covid-19 Vaccination Clinic  Name:  Robert Gaines    MRN: [id number] DOB: 07-May-1946  11/09/2019  [name] was observed post Covid-19 immunization for 15 minutes without incident. He was provided with Vaccine Information Sheet and instruction to access the V-Safe system.   [name] was instructed to call 911 with any severe reactions post vaccine: Marland Kitchen Difficulty breathing  . Swelling of face and throat  . A fast heartbeat  . A bad rash all over body  . Dizziness and weakness   Immunizations Administered    Name Date Dose VIS Date Route   Pfizer COVID-19 Vaccine 11/09/2019  2:36 PM 0.3 mL 08/05/2019 Intramuscular   Manufacturer: [employer], [employer]   Lot: [id number]   NDC: [address]-3

## 2020-02-13 ENCOUNTER — Telehealth: Payer: Self-pay | Admitting: Family Medicine

## 2020-02-13 NOTE — Telephone Encounter (Signed)
Called pt. No answer, No VM on either phone number listed. Pt needs to reschedule AWV for 6/29 due to Inetta Fermo being out of office.

## 2020-02-20 NOTE — Progress Notes (Signed)
Phone: [phone]   Subjective:  Patient presents today for their annual physical. Chief complaint-noted.   See problem oriented charting- Review of Systems  Constitutional: Negative for chills and fever.  HENT: Negative for ear discharge, ear pain and hearing loss.   Eyes: Negative for blurred vision and double vision.  Respiratory: Negative for cough and shortness of breath.   Cardiovascular: Negative for chest pain and palpitations.  Gastrointestinal: Negative for constipation, diarrhea, nausea and vomiting.  Genitourinary: Negative for dysuria and frequency.  Musculoskeletal: Negative for joint pain and myalgias.  Skin: Negative for itching and rash.  Neurological: Negative for dizziness and headaches.  Endo/Heme/Allergies: Negative for polydipsia. Does not bruise/bleed easily.  Psychiatric/Behavioral: Negative for hallucinations and suicidal ideas.    The following were reviewed and entered/updated in epic: Past Medical History:  Diagnosis Date  . BPH associated with nocturia 10/03/2014  . Depression   . Hyperlipidemia   . Hypertension   . PSA, INCREASED 04/16/2010   History prostatitis x 2, # decreased with ciprofloxacin    . Rosacea    Patient Active Problem List   Diagnosis Date Noted  . Hyperglycemia 10/03/2014    Priority: Medium  . BPH associated with nocturia 10/03/2014    Priority: Medium  . Gout 06/21/2009    Priority: Medium  . Chronic kidney disease, stage III (moderate) 04/05/2008    Priority: Medium  . Hypertension 05/31/2007    Priority: Medium  . Hyperlipidemia 05/24/2007    Priority: Medium  . Seasonal allergies 05/11/2014    Priority: Low  . PSA, INCREASED 04/16/2010    Priority: Low  . DEPRESSION 05/24/2007    Priority: Low   Past Surgical History:  Procedure Laterality Date  . FLEXIBLE SIGMOIDOSCOPY    . TONSILLECTOMY     at around 60-9 yrs old  . WISDOM TOOTH EXTRACTION      Family History  Problem Relation Age of Onset  . Heart  disease Father        MI 60, smoker  . Colon cancer Neg Hx   . Esophageal cancer Neg Hx   . Rectal cancer Neg Hx   . Stomach cancer Neg Hx     Medications- reviewed and updated Current Outpatient Medications  Medication Sig Dispense Refill  . allopurinol (ZYLOPRIM) 100 MG tablet Take two pills daily. 60 tablet 11  . aspirin 81 MG tablet Take 81 mg by mouth daily.      Marland Kitchen atorvastatin (LIPITOR) 40 MG tablet TAKE 1 TABLET (40 MG TOTAL) BY MOUTH DAILY. 90 tablet 3  . Biotin 5000 MCG TABS Take 1 tablet by mouth.    . Calcium Carbonate-Vitamin D (CALCIUM-VITAMIN D) 500-200 MG-UNIT tablet Take 1 tablet by mouth daily.    . cyanocobalamin 1000 MCG tablet Take 1,000 mcg by mouth daily.    . ferrous sulfate 325 (65 FE) MG tablet Take 325 mg by mouth daily with breakfast.    . glucosamine-chondroitin 500-400 MG tablet Take 1 tablet by mouth 3 (three) times daily.    Marland Kitchen lisinopril (ZESTRIL) 5 MG tablet Take 1 tablet (5 mg total) by mouth daily. 90 tablet 3  . Multiple Vitamins-Minerals (CENTRUM SILVER PO) Take 1 tablet by mouth daily.      No current facility-administered medications for this visit.    Allergies-reviewed and updated No Known Allergies  Social History   Social History Narrative   Married 1978. Son from first marriage [name] (1971-psychiatrist in [place]). No grandkids.    2 dogs-sheltie/collie mix  and border collie mix      Retired from [employer] educational needs teacher      Hobbies: time with dogs, exercising   Objective  Objective:  BP 100/62   Pulse 66   Temp 98 F (36.7 C)   Ht 5\' 5"  (1.651 m)   Wt 173 lb 8 oz (78.7 kg)   SpO2 100%   BMI 28.87 kg/m  Gen: NAD, resting comfortably HEENT: Mucous membranes are moist. Oropharynx normal Neck: no thyromegaly CV: RRR no murmurs rubs or gallops Lungs: CTAB no crackles, wheeze, rhonchi Abdomen: soft/nontender/nondistended/normal bowel sounds. No rebound or guarding.  Ext: no edema Skin: warm, dry Neuro:  grossly normal, moves all extremities, PERRLA    Assessment and Plan  74 y.o. male presenting for annual physical.  Health Maintenance counseling: 1. Anticipatory guidance: Patient counseled regarding regular dental exams q6 months, eye exams yearly,  avoiding smoking and second hand smoke, limiting alcohol to 2 beverages per day- he does 1 a week- 1 michelob ultra   2. Risk factor reduction:  Advised patient of need for regular exercise and diet rich and fruits and vegetables to reduce risk of heart attack and stroke. Exercise- walks daily and going to gym- 1.75 hours at the gym. Diet-catered meals, trying to eat reasonably healthy Wt Readings from Last 3 Encounters:  02/21/20 173 lb 8 oz (78.7 kg)  08/09/19 174 lb (78.9 kg)  01/20/19 172 lb (78 kg)  3. Immunizations/screenings/ancillary studies- up to date other than shingrix - discussed considering this at pharmacy Immunization History  Administered Date(s) Administered  . Influenza Split 06/05/2011, 05/06/2012  . Influenza Whole 08/25/2000, 05/31/2007, 05/17/2009, 06/06/2010  . Influenza, High Dose Seasonal PF 05/22/2016, 06/08/2017, 07/03/2018, 04/19/2019  . Influenza,inj,Quad PF,6+ Mos 08/09/2013, 05/11/2014, 05/24/2015  . Influenza-Unspecified 07/05/2017, 04/19/2019  . PFIZER SARS-COV-2 Vaccination 10/16/2019, 11/09/2019  . Pneumococcal Conjugate-13 10/09/2015  . Pneumococcal Polysaccharide-23 05/11/2014  . Td 06/25/1998, 04/05/2008  . Tdap 04/17/2019   4. Prostate cancer screening-plan after last visit was for urological consultation-originally this was entered for [facility] on accident-was changed to [facility] but in the back and forth tracts of scheduling was not completed-we will recheck a PSA today and it has not trended back down will refer to [facility] specifically Lab Results  Component Value Date   PSA 4.40 (H) 08/09/2019   PSA 3.53 01/20/2019   PSA 4.14 (H) 01/08/2018   5. Colon  cancer screening - 11/02/13 with 10 year repeat 6. Skin cancer screening- no recent dermatology visit- advised regular sunscreen use. Denies worrisome, changing, or new skin lesions.  7. Never smoker 8. STD screening - monogamous so declines  Status of chronic or acute concerns   # overweight/ hyperglycemia/prediabetes S: improved at last visit with lifestyle changes   Lab Results  Component Value Date   HGBA1C 5.8 08/09/2019   HGBA1C 6.1 01/20/2019   HGBA1C 5.8 07/15/2018  A/P: for prediabetes/hyperglycemia- update a1c today For overweight status- has done a great job with healthy eating/regular exercise.   #hypertension/CKD III S: compliant with lisinopril 5mg   BP Readings from Last 3 Encounters:  02/21/20 100/62 precoffee and eating  08/09/19 126/70  07/15/18 124/70  GFR has been stable in 50s. Ace I in case proteinuric element  A/P: both issues stable- update BMP. Continue lisinopril  #hyperlipidemia S:  Compliant with atorvastatin 40mg  , also takes asa for primary prevention Lab Results  Component Value Date   CHOL 183 01/20/2019   HDL 46.00 01/20/2019  LDLCALC 87 10/07/2016   LDLDIRECT 96.0 01/20/2019   TRIG 224.0 (H) 01/20/2019   CHOLHDL 4 01/20/2019    A/P: mildly elevated above ldl 70. Do not want to increase dose of statin unless LDL over 100. We will continue asa with dad's history.   #Gout S: 0 flares since last visit on allopurinol 200mg  per day A/P: update uric acid with labs today- unlikely to change dose even if slightly above goal of 6  #elevated PSA- see discussion above  # prefers to take iron and several other supplements  Recommended follow up: Return in about 6 months (around 08/22/2020) for follow up- or sooner if needed. Future Appointments  Date Time Provider [place]  02/21/2020  1:00 PM [facility]   Lab/Order associations: fasting   ICD-10-CM   1. Preventative health care  Z00.00 CBC with  Differential/Platelet    Comprehensive metabolic panel    Lipid panel    Hemoglobin A1c    PSA    Uric acid  2. Essential hypertension  I10 CBC with Differential/Platelet    Comprehensive metabolic panel  3. Hyperlipidemia, unspecified hyperlipidemia type  E78.5 Lipid panel  4. Idiopathic gout of foot, unspecified chronicity, unspecified laterality  M10.079 Uric acid  5. Hyperglycemia  R73.9 Hemoglobin A1c  6. Elevated PSA  R97.20 PSA   Return precautions advised.  Garret Reddish, MD

## 2020-02-20 NOTE — Patient Instructions (Addendum)
Please stop by lab before you go If you have mychart- we will send your results within 3 business days of [place] receiving them.  If you do not have mychart- we will call you about results within 5 business days of [place] receiving them.   Please check with your pharmacy to see if they have the shingrix vaccine. If they do- please get this immunization and update [place] by phone call or mychart with dates you receive the vaccine

## 2020-02-21 ENCOUNTER — Ambulatory Visit (INDEPENDENT_AMBULATORY_CARE_PROVIDER_SITE_OTHER): Payer: Medicare PPO | Admitting: Family Medicine

## 2020-02-21 ENCOUNTER — Encounter: Payer: Self-pay | Admitting: Family Medicine

## 2020-02-21 ENCOUNTER — Ambulatory Visit: Payer: Medicare PPO

## 2020-02-21 ENCOUNTER — Other Ambulatory Visit: Payer: Self-pay

## 2020-02-21 VITALS — BP 100/62 | HR 66 | Temp 98.0°F | Ht 65.0 in | Wt 173.5 lb

## 2020-02-21 DIAGNOSIS — E785 Hyperlipidemia, unspecified: Secondary | ICD-10-CM | POA: Diagnosis not present

## 2020-02-21 DIAGNOSIS — Z0000 Encounter for general adult medical examination without abnormal findings: Secondary | ICD-10-CM | POA: Diagnosis not present

## 2020-02-21 DIAGNOSIS — R972 Elevated prostate specific antigen [PSA]: Secondary | ICD-10-CM

## 2020-02-21 DIAGNOSIS — I10 Essential (primary) hypertension: Secondary | ICD-10-CM | POA: Diagnosis not present

## 2020-02-21 DIAGNOSIS — R739 Hyperglycemia, unspecified: Secondary | ICD-10-CM

## 2020-02-21 DIAGNOSIS — M10079 Idiopathic gout, unspecified ankle and foot: Secondary | ICD-10-CM

## 2020-02-21 LAB — CBC WITH DIFFERENTIAL/PLATELET
Basophils Absolute: 0 10*3/uL (ref 0.0–0.1)
Basophils Relative: 0.4 % (ref 0.0–3.0)
Eosinophils Absolute: 0.1 10*3/uL (ref 0.0–0.7)
Eosinophils Relative: 0.8 % (ref 0.0–5.0)
HCT: 41.3 % (ref 39.0–52.0)
Hemoglobin: 14.3 g/dL (ref 13.0–17.0)
Lymphocytes Relative: 14.5 % (ref 12.0–46.0)
Lymphs Abs: 1.1 10*3/uL (ref 0.7–4.0)
MCHC: 34.5 g/dL (ref 30.0–36.0)
MCV: 92.1 fl (ref 78.0–100.0)
Monocytes Absolute: 0.7 10*3/uL (ref 0.1–1.0)
Monocytes Relative: 9.5 % (ref 3.0–12.0)
Neutro Abs: 5.4 10*3/uL (ref 1.4–7.7)
Neutrophils Relative %: 74.8 % (ref 43.0–77.0)
Platelets: 210 10*3/uL (ref 150.0–400.0)
RBC: 4.49 Mil/uL (ref 4.22–5.81)
RDW: 13.6 % (ref 11.5–15.5)
WBC: 7.3 10*3/uL (ref 4.0–10.5)

## 2020-02-21 LAB — COMPREHENSIVE METABOLIC PANEL
ALT: 21 U/L (ref 0–53)
AST: 19 U/L (ref 0–37)
Albumin: 4.6 g/dL (ref 3.5–5.2)
Alkaline Phosphatase: 37 U/L — ABNORMAL LOW (ref 39–117)
BUN: 23 mg/dL (ref 6–23)
CO2: 29 mEq/L (ref 19–32)
Calcium: 9.4 mg/dL (ref 8.4–10.5)
Chloride: 102 mEq/L (ref 96–112)
Creatinine, Ser: 1.31 mg/dL (ref 0.40–1.50)
GFR: 53.71 mL/min — ABNORMAL LOW (ref >60.00)
Glucose, Bld: 102 mg/dL — ABNORMAL HIGH (ref 70–99)
Potassium: 4.6 mEq/L (ref 3.5–5.1)
Sodium: 139 mEq/L (ref 135–145)
Total Bilirubin: 0.6 mg/dL (ref 0.2–1.2)
Total Protein: 6.5 g/dL (ref 6.0–8.3)

## 2020-02-21 LAB — URIC ACID: Uric Acid, Serum: 5.6 mg/dL (ref 4.0–7.8)

## 2020-02-21 LAB — HEMOGLOBIN A1C: Hgb A1c MFr Bld: 5.8 % (ref 4.6–6.5)

## 2020-02-21 LAB — LIPID PANEL
Cholesterol: 152 mg/dL (ref 0–200)
HDL: 45.4 mg/dL (ref >39.00)
LDL Cholesterol: 74 mg/dL (ref 0–99)
NonHDL: 106.11
Total CHOL/HDL Ratio: 3
Triglycerides: 159 mg/dL — ABNORMAL HIGH (ref 0.0–149.0)
VLDL: 31.8 mg/dL (ref 0.0–40.0)

## 2020-02-21 LAB — PSA: PSA: 4.64 ng/mL — ABNORMAL HIGH (ref 0.10–4.00)

## 2020-02-22 ENCOUNTER — Other Ambulatory Visit: Payer: Self-pay

## 2020-02-22 DIAGNOSIS — R972 Elevated prostate specific antigen [PSA]: Secondary | ICD-10-CM

## 2020-02-23 ENCOUNTER — Ambulatory Visit (INDEPENDENT_AMBULATORY_CARE_PROVIDER_SITE_OTHER): Payer: Medicare PPO

## 2020-02-23 DIAGNOSIS — Z0000 Encounter for general adult medical examination without abnormal findings: Secondary | ICD-10-CM

## 2020-02-23 NOTE — Progress Notes (Signed)
Subjective:   Robert Gaines is a 74 y.o. male who presents for an Initial Medicare Annual Wellness Visit.  Virtual Visit via Telephone Note  I connected with  Robert Gaines on 02/23/20 at 12:00 PM EDT by telephone and verified that I am speaking with the correct person using two identifiers.  Medicare Annual Wellness visit completed telephonically due to Covid-19 pandemic.   Location: Patient: Home Provider: Office Participating in the call are Health Coach and Patient   I discussed the limitations, risks, security and privacy concerns of performing an evaluation and management service by telephone and the availability of in person appointments. The patient expressed understanding and agreed to proceed.  Unable to perform video visit due to video visit attempted and failed and/or patient does not have video capability.   Some vital signs may be absent or patient reported.   [name], LPN        Review of Systems     Cardiac Risk Factors include: dyslipidemia;male gender     Objective:    There were no vitals filed for this visit. There is no height or weight on file to calculate BMI.  Advanced Directives 02/23/2020 11/29/2015 11/20/2015  Does Patient Have a Medical Advance Directive? Yes No No  Type of Advance Directive Living will;Healthcare Power of Attorney - -  Copy of Healthcare Power of Attorney in Chart? No - copy requested - -  Would patient like information on creating a medical advance directive? - No - patient declined information No - patient declined information    Current Medications (verified) Outpatient Encounter Medications as of 02/23/2020  Medication Sig  . allopurinol (ZYLOPRIM) 100 MG tablet Take two pills daily.  Marland Kitchen aspirin 81 MG tablet Take 81 mg by mouth daily.    Marland Kitchen atorvastatin (LIPITOR) 40 MG tablet TAKE 1 TABLET (40 MG TOTAL) BY MOUTH DAILY.  Marland Kitchen Biotin 5000 MCG TABS Take 1 tablet by mouth.  . Calcium Carbonate-Vitamin D  (CALCIUM-VITAMIN D) 500-200 MG-UNIT tablet Take 1 tablet by mouth daily.  . cyanocobalamin 1000 MCG tablet Take 1,000 mcg by mouth daily.  . ferrous sulfate 325 (65 FE) MG tablet Take 325 mg by mouth daily with breakfast.  . glucosamine-chondroitin 500-400 MG tablet Take 1 tablet by mouth 3 (three) times daily.  Marland Kitchen lisinopril (ZESTRIL) 5 MG tablet Take 1 tablet (5 mg total) by mouth daily.  . Multiple Vitamins-Minerals (CENTRUM SILVER PO) Take 1 tablet by mouth daily.    No facility-administered encounter medications on file as of 02/23/2020.    Allergies (verified) Patient has no known allergies.   History: Past Medical History:  Diagnosis Date  . BPH associated with nocturia 10/03/2014  . Depression   . Hyperlipidemia   . Hypertension   . PSA, INCREASED 04/16/2010   History prostatitis x 2, # decreased with ciprofloxacin    . Rosacea    Past Surgical History:  Procedure Laterality Date  . FLEXIBLE SIGMOIDOSCOPY    . TONSILLECTOMY     at around 70-9 yrs old  . WISDOM TOOTH EXTRACTION     Family History  Problem Relation Age of Onset  . Heart disease Father        MI 76, smoker  . Colon cancer Neg Hx   . Esophageal cancer Neg Hx   . Rectal cancer Neg Hx   . Stomach cancer Neg Hx    Social History   Socioeconomic History  . Marital status: Married    Spouse  name: Not on file  . Number of children: Not on file  . Years of education: Not on file  . Highest education level: Not on file  Occupational History  . Occupation: Retired  Tobacco Use  . Smoking status: Never Smoker  . Smokeless tobacco: Never Used  Substance and Sexual Activity  . Alcohol use: Yes    Comment: occas  . Drug use: No  . Sexual activity: Not on file  Other Topics Concern  . Not on file  Social History Narrative   Married 1978. Son from first marriage [name] (1971-psychiatrist in [place]). No grandkids.    2 dogs-sheltie/collie mix and border collie mix      Retired from Marketing executive      Hobbies: time with dogs, exercising   Social Determinants of Health   Financial Resource Strain: Low Risk   . Difficulty of Paying Living Expenses: Not hard at all  Food Insecurity: No Food Insecurity  . Worried About Programme researcher, broadcasting/film/video in the Last Year: Never true  . Ran Out of Food in the Last Year: Never true  Transportation Needs: No Transportation Needs  . Lack of Transportation (Medical): No  . Lack of Transportation (Non-Medical): No  Physical Activity: Sufficiently Active  . Days of Exercise per Week: 3 days  . Minutes of Exercise per Session: 90 min  Stress: No Stress Concern Present  . Feeling of Stress : Not at all  Social Connections: Moderately Isolated  . Frequency of Communication with Friends and Family: Twice a week  . Frequency of Social Gatherings with Friends and Family: More than three times a week  . Attends Religious Services: Never  . Active Member of Clubs or Organizations: No  . Attends Banker Meetings: Never  . Marital Status: Married    Tobacco Counseling Counseling given: Not Answered   Clinical Intake:  Pre-visit preparation completed: Yes  Pain : No/denies pain     BMI - recorded: 28.87 Nutritional Status: BMI 25 -29 Overweight Nutritional Risks: None Diabetes: No  How often do you need to have someone help you when you read instructions, pamphlets, or other written materials from your doctor or pharmacy?: 1 - Never  Diabetic?No  Interpreter Needed?: No  Information entered by :: Lanier Ensign, LPN   Activities of Daily Living In your present state of health, do you have any difficulty performing the following activities: 02/23/2020 08/09/2019  Hearing? N N  Vision? N N  Difficulty concentrating or making decisions? N N  Walking or climbing stairs? N N  Dressing or bathing? N N  Doing errands, shopping? N N  Preparing Food and eating ? N -  Using the Toilet? N -  In the past six  months, have you accidently leaked urine? N -  Do you have problems with loss of bowel control? N -  Managing your Medications? N -  Managing your Finances? N -  Housekeeping or managing your Housekeeping? N -  Some recent data might be hidden    Patient Care Team: Shelva Majestic, MD as PCP - General (Family Medicine)  Indicate any recent Medical Services you may have received from other than [facility] providers in the past year (date may be approximate).     Assessment:   This is a routine wellness examination for [employer].  Hearing/Vision screen  Hearing Screening   125Hz  250Hz  500Hz  1000Hz  2000Hz  3000Hz  4000Hz  6000Hz  8000Hz   Right ear:  Left ear:           Comments: Pt denies difficulty hearing at this time  Vision Screening Comments: Wears reading glasses at times Followed up with Dr Nile RiggsShapiro already this year for annual  Dietary issues and exercise activities discussed: Current Exercise Habits: Home exercise routine, Time (Minutes): 60, Frequency (Times/Week): 3, Weekly Exercise (Minutes/Week): 180, Intensity: Moderate, Exercise limited by: None identified  Goals    . Patient Stated     Staying healthy      Depression Screen PHQ 2/9 Scores 02/23/2020 02/21/2020 01/20/2019 01/08/2018 01/08/2018 10/14/2016 10/09/2015  PHQ - 2 Score 0 0 0 0 0 0 0  PHQ- 9 Score 0 0 0 0 - - -    Fall Risk Fall Risk  02/23/2020 02/21/2020 01/20/2019 01/08/2018 10/14/2016  Falls in the past year? 0 0 0 No No  Number falls in past yr: 0 0 0 - -  Injury with Fall? 0 0 0 - -  Risk for fall due to : No Fall Risks - - - -  Follow up Falls prevention discussed - - - -    Any stairs in or around the home? Yes  If so, are there any without handrails? Yes  Home free of loose throw rugs in walkways, pet beds, electrical cords, etc? Yes  Adequate lighting in your home to reduce risk of falls? Yes   ASSISTIVE DEVICES UTILIZED TO PREVENT FALLS:  Life alert? No  Use of a cane, walker or w/c? No  Grab  bars in the bathroom? No  Shower chair or bench in shower? No  Elevated toilet seat or a handicapped toilet? No   TIMED UP AND GO:  Was the test performed? No .     Cognitive Function:     6CIT Screen 02/23/2020  What Year? 0 points  What month? 0 points  What time? 0 points  Count back from 20 0 points  Months in reverse 0 points    Immunizations Immunization History  Administered Date(s) Administered  . Influenza Split 06/05/2011, 05/06/2012  . Influenza Whole 08/25/2000, 05/31/2007, 05/17/2009, 06/06/2010  . Influenza, High Dose Seasonal PF 05/22/2016, 06/08/2017, 07/03/2018, 04/19/2019  . Influenza,inj,Quad PF,6+ Mos 08/09/2013, 05/11/2014, 05/24/2015  . Influenza-Unspecified 07/05/2017, 04/19/2019  . PFIZER SARS-COV-2 Vaccination 10/16/2019, 11/09/2019  . Pneumococcal Conjugate-13 10/09/2015  . Pneumococcal Polysaccharide-23 05/11/2014  . Td 06/25/1998, 04/05/2008  . Tdap 04/17/2019    TDAP status: Up to date Flu Vaccine status: Up to date Pneumococcal vaccine status: Up to date Covid-19 vaccine status: Completed vaccines  Qualifies for Shingles Vaccine? Yes   Zostavax completed No     Screening Tests Health Maintenance  Topic Date Due  . INFLUENZA VACCINE  03/25/2020  . COLONOSCOPY  11/03/2023  . TETANUS/TDAP  04/16/2029  . COVID-19 Vaccine  Completed  . Hepatitis C Screening  Completed  . PNA vac Low Risk Adult  Completed    Health Maintenance  There are no preventive care reminders to display for this patient.  Colorectal cancer screening: Completed 11/02/13. Repeat every 10 years   Hepatitis C Screening: Completed 01/10/16  Vision Screening: Recommended annual ophthalmology exams for early detection of glaucoma and other disorders of the eye. Is the patient up to date with their annual eye exam?  Yes  Who is the provider or what is the name of the office in which the patient attends annual eye exams? Dr Nile RiggsShapiro   Dental Screening: Recommended  annual dental exams for proper oral hygiene  Community Resource Referral / Chronic Care Management: CRR required this visit?  No   CCM required this visit?  No      Plan:     I have personally reviewed and noted the following in the patient's chart:   . Medical and social history . Use of alcohol, tobacco or illicit drugs  . Current medications and supplements . Functional ability and status . Nutritional status . Physical activity . Advanced directives . List of other physicians . Hospitalizations, surgeries, and ER visits in previous 12 months . Vitals . Screenings to include cognitive, depression, and falls . Referrals and appointments  In addition, I have reviewed and discussed with patient certain preventive protocols, quality metrics, and best practice recommendations. A written personalized care plan for preventive services as well as general preventive health recommendations were provided to patient.     [name], LPN   02/27/4491   Nurse Notes: None

## 2020-02-23 NOTE — Patient Instructions (Addendum)
Robert Gaines , Thank you for taking time to come for your Medicare Wellness Visit. I appreciate your ongoing commitment to your health goals. Please review the following plan we discussed and let me know if I can assist you in the future.   Screening recommendations/referrals: Colonoscopy: Done 11/02/13 Recommended yearly ophthalmology/optometry visit for glaucoma screening and checkup Recommended yearly dental visit for hygiene and checkup  Vaccinations: Influenza vaccine: Up to date Pneumococcal vaccine: Up to date Tdap vaccine: Up to date  Covid-19: Completed 10/16/19 &11/09/19  Advanced directives: Please bring a copy of your health care power of attorney and living will to the office at your convenience.  Conditions/risks identified: Continue to stay healthy  Next appointment: Follow up in one year for your annual wellness visit.   Preventive Care 73 Years and Older, Male Preventive care refers to lifestyle choices and visits with your health care provider that can promote health and wellness. What does preventive care include?  A yearly physical exam. This is also called an annual well check.  Dental exams once or twice a year.  Routine eye exams. Ask your health care provider how often you should have your eyes checked.  Personal lifestyle choices, including:  Daily care of your teeth and gums.  Regular physical activity.  Eating a healthy diet.  Avoiding tobacco and drug use.  Limiting alcohol use.  Practicing safe sex.  Taking low doses of aspirin every day.  Taking vitamin and mineral supplements as recommended by your health care provider. What happens during an annual well check? The services and screenings done by your health care provider during your annual well check will depend on your age, overall health, lifestyle risk factors, and family history of disease. Counseling  Your health care provider may ask you questions about your:  Alcohol  use.  Tobacco use.  Drug use.  Emotional well-being.  Home and relationship well-being.  Sexual activity.  Eating habits.  History of falls.  Memory and ability to understand (cognition).  Work and work Astronomer. Screening  You may have the following tests or measurements:  Height, weight, and BMI.  Blood pressure.  Lipid and cholesterol levels. These may be checked every 5 years, or more frequently if you are over 79 years old.  Skin check.  Lung cancer screening. You may have this screening every year starting at age 23 if you have a 30-pack-year history of smoking and currently smoke or have quit within the past 15 years.  Fecal occult blood test (FOBT) of the stool. You may have this test every year starting at age 70.  Flexible sigmoidoscopy or colonoscopy. You may have a sigmoidoscopy every 5 years or a colonoscopy every 10 years starting at age 4.  Prostate cancer screening. Recommendations will vary depending on your family history and other risks.  Hepatitis C blood test.  Hepatitis B blood test.  Sexually transmitted disease (STD) testing.  Diabetes screening. This is done by checking your blood sugar (glucose) after you have not eaten for a while (fasting). You may have this done every 1-3 years.  Abdominal aortic aneurysm (AAA) screening. You may need this if you are a current or former smoker.  Osteoporosis. You may be screened starting at age 56 if you are at high risk. Talk with your health care provider about your test results, treatment options, and if necessary, the need for more tests. Vaccines  Your health care provider may recommend certain vaccines, such as:  Influenza vaccine. This is  recommended every year.  Tetanus, diphtheria, and acellular pertussis (Tdap, Td) vaccine. You may need a Td booster every 10 years.  Zoster vaccine. You may need this after age 42.  Pneumococcal 13-valent conjugate (PCV13) vaccine. One dose is  recommended after age 42.  Pneumococcal polysaccharide (PPSV23) vaccine. One dose is recommended after age 57. Talk to your health care provider about which screenings and vaccines you need and how often you need them. This information is not intended to replace advice given to you by your health care provider. Make sure you discuss any questions you have with your health care provider. Document Released: 09/07/2015 Document Revised: 04/30/2016 Document Reviewed: 06/12/2015 Elsevier Interactive Patient Education  2017 [place] Prevention in the Home Falls can cause injuries. They can happen to people of all ages. There are many things you can do to make your home safe and to help prevent falls. What can I do on the outside of my home?  Regularly fix the edges of walkways and driveways and fix any cracks.  Remove anything that might make you trip as you walk through a door, such as a raised step or threshold.  Trim any bushes or trees on the path to your home.  Use bright outdoor lighting.  Clear any walking paths of anything that might make someone trip, such as rocks or tools.  Regularly check to see if handrails are loose or broken. Make sure that both sides of any steps have handrails.  Any raised decks and porches should have guardrails on the edges.  Have any leaves, snow, or ice cleared regularly.  Use sand or salt on walking paths during winter.  Clean up any spills in your garage right away. This includes oil or grease spills. What can I do in the bathroom?  Use night lights.  Install grab bars by the toilet and in the tub and shower. Do not use towel bars as grab bars.  Use non-skid mats or decals in the tub or shower.  If you need to sit down in the shower, use a plastic, non-slip stool.  Keep the floor dry. Clean up any water that spills on the floor as soon as it happens.  Remove soap buildup in the tub or shower regularly.  Attach bath mats  securely with double-sided non-slip rug tape.  Do not have throw rugs and other things on the floor that can make you trip. What can I do in the bedroom?  Use night lights.  Make sure that you have a light by your bed that is easy to reach.  Do not use any sheets or blankets that are too big for your bed. They should not hang down onto the floor.  Have a firm chair that has side arms. You can use this for support while you get dressed.  Do not have throw rugs and other things on the floor that can make you trip. What can I do in the kitchen?  Clean up any spills right away.  Avoid walking on wet floors.  Keep items that you use a lot in easy-to-reach places.  If you need to reach something above you, use a strong step stool that has a grab bar.  Keep electrical cords out of the way.  Do not use floor polish or wax that makes floors slippery. If you must use wax, use non-skid floor wax.  Do not have throw rugs and other things on the floor that can make you trip.  What can I do with my stairs?  Do not leave any items on the stairs.  Make sure that there are handrails on both sides of the stairs and use them. Fix handrails that are broken or loose. Make sure that handrails are as long as the stairways.  Check any carpeting to make sure that it is firmly attached to the stairs. Fix any carpet that is loose or worn.  Avoid having throw rugs at the top or bottom of the stairs. If you do have throw rugs, attach them to the floor with carpet tape.  Make sure that you have a light switch at the top of the stairs and the bottom of the stairs. If you do not have them, ask someone to add them for you. What else can I do to help prevent falls?  Wear shoes that:  Do not have high heels.  Have rubber bottoms.  Are comfortable and fit you well.  Are closed at the toe. Do not wear sandals.  If you use a stepladder:  Make sure that it is fully opened. Do not climb a closed  stepladder.  Make sure that both sides of the stepladder are locked into place.  Ask someone to hold it for you, if possible.  Clearly mark and make sure that you can see:  Any grab bars or handrails.  First and last steps.  Where the edge of each step is.  Use tools that help you move around (mobility aids) if they are needed. These include:  Canes.  Walkers.  Scooters.  Crutches.  Turn on the lights when you go into a dark area. Replace any light bulbs as soon as they burn out.  Set up your furniture so you have a clear path. Avoid moving your furniture around.  If any of your floors are uneven, fix them.  If there are any pets around you, be aware of where they are.  Review your medicines with your doctor. Some medicines can make you feel dizzy. This can increase your chance of falling. Ask your doctor what other things that you can do to help prevent falls. This information is not intended to replace advice given to you by your health care provider. Make sure you discuss any questions you have with your health care provider. Document Released: 06/07/2009 Document Revised: 01/17/2016 Document Reviewed: 09/15/2014 Elsevier Interactive Patient Education  2017 [employer].

## 2020-06-03 ENCOUNTER — Other Ambulatory Visit: Payer: Self-pay | Admitting: Family Medicine

## 2020-08-07 ENCOUNTER — Other Ambulatory Visit: Payer: Self-pay | Admitting: Family Medicine

## 2020-08-22 ENCOUNTER — Ambulatory Visit: Payer: Medicare PPO | Admitting: Family Medicine

## 2020-08-24 ENCOUNTER — Other Ambulatory Visit: Payer: Self-pay | Admitting: Family Medicine

## 2020-08-28 ENCOUNTER — Ambulatory Visit: Payer: Medicare PPO | Admitting: Family Medicine

## 2020-09-12 NOTE — Patient Instructions (Addendum)
Please stop by lab before you go If you have mychart- we will send your results within 3 business days of [place] receiving them.  If you do not have mychart- we will call you about results within 5 business days of [place] receiving them.  *please also note that you will see labs on mychart as soon as they post. I will later go in and write notes on them- will say "notes from Dr. Buel Ream job on the gym and maintaining the weight loss!   Recommended follow up: Return in about 6 months (around 03/13/2021) for physical or sooner if needed.

## 2020-09-12 NOTE — Progress Notes (Signed)
Phone [phone] In person visit   Subjective:   Robert Gaines is a 75 y.o. year old very pleasant male patient who presents for/with See problem oriented charting Chief Complaint  Patient presents with  . Hypertension  . Hyperlipidemia   This visit occurred during the SARS-CoV-2 public health emergency.  Safety protocols were in place, including screening questions prior to the visit, additional usage of staff PPE, and extensive cleaning of exam room while observing appropriate contact time as indicated for disinfecting solutions.   Past Medical History-  Patient Active Problem List   Diagnosis Date Noted  . Hyperglycemia 10/03/2014    Priority: Medium  . BPH associated with nocturia 10/03/2014    Priority: Medium  . Gout 06/21/2009    Priority: Medium  . Chronic kidney disease, stage III (moderate) (HCC) 04/05/2008    Priority: Medium  . Hypertension 05/31/2007    Priority: Medium  . Hyperlipidemia 05/24/2007    Priority: Medium  . Seasonal allergies 05/11/2014    Priority: Low  . PSA, INCREASED 04/16/2010    Priority: Low  . DEPRESSION 05/24/2007    Priority: Low    Medications- reviewed and updated Current Outpatient Medications  Medication Sig Dispense Refill  . allopurinol (ZYLOPRIM) 100 MG tablet TAKE 2 TABLETS BY MOUTH EVERY DAY 180 tablet 3  . aspirin 81 MG tablet Take 81 mg by mouth daily.    Marland Kitchen atorvastatin (LIPITOR) 40 MG tablet TAKE 1 TABLET BY MOUTH EVERY DAY 90 tablet 3  . Biotin 5000 MCG TABS Take 1 tablet by mouth.    . Calcium Carbonate-Vitamin D (CALCIUM-VITAMIN D) 500-200 MG-UNIT tablet Take 1 tablet by mouth daily.    . cyanocobalamin 1000 MCG tablet Take 1,000 mcg by mouth daily.    . ferrous sulfate 325 (65 FE) MG tablet Take 325 mg by mouth daily with breakfast.    . glucosamine-chondroitin 500-400 MG tablet Take 1 tablet by mouth 3 (three) times daily.    Marland Kitchen lisinopril (ZESTRIL) 5 MG tablet TAKE 1 TABLET BY MOUTH EVERY DAY 90 tablet 3  .  Multiple Vitamins-Minerals (CENTRUM SILVER PO) Take 1 tablet by mouth daily.      No current facility-administered medications for this visit.     Objective:  BP 136/74   Pulse 69   Temp (!) 97.2 F (36.2 C) (Temporal)   Ht 5\' 5"  (1.651 m)   Wt 172 lb 3.2 oz (78.1 kg)   SpO2 96%   BMI 28.66 kg/m  Gen: NAD, resting comfortably CV: RRR no murmurs rubs or gallops Lungs: CTAB no crackles, wheeze, rhonchi Abdomen: soft/nontender/nondistended/normal bowel sounds.  Ext: no edema Skin: warm, dry     Assessment and Plan   # overweight/ hyperglycemia/prediabetes-peak A1c at least 6.1 S: Patient has been able to maintain weight under 175-has lost an additional 1 pound from last visit despite the holidays!  Congratulated patient he continues remain active at the gym typically 3 days a week. Lab Results  Component Value Date   HGBA1C 5.8 02/21/2020   HGBA1C 5.8 08/09/2019   HGBA1C 6.1 01/20/2019    Wt Readings from Last 3 Encounters:  09/13/20 172 lb 3.2 oz (78.1 kg)  02/21/20 173 lb 8 oz (78.7 kg)  08/09/19 174 lb (78.9 kg)   A/P: for prediabetes/hyperglycemia- update a1c today- with weight loss hoping looking better For overweight-  Doing great job with weight loss- continue his efforts  #hypertension/CKD III S: compliant with lisinopril 5mg    GFR has been stable  in 58s. Ace I in case proteinuric element .  Last GFR calculated at 53.7 BP Readings from Last 3 Encounters:  09/13/20 136/74  02/21/20 100/62  08/09/19 126/70   A/P: Hypertension well-controlled-continue current medication  Chronic kidney disease stage III-stable on last check - continue to monitor with labs today  #hyperlipidemia S:  Compliant with atorvastatin 40mg   Lab Results  Component Value Date   CHOL 152 02/21/2020   HDL 45.40 02/21/2020   LDLCALC 74 02/21/2020   LDLDIRECT 96.0 01/20/2019   TRIG 159.0 (H) 02/21/2020   CHOLHDL 3 02/21/2020   A/P:  reasonable control with current treatment- continue  current rx  #Gout S: 0 flares in last 6 months on allopurinol 200mg  per day  Lab Results  Component Value Date   LABURIC 5.6 02/21/2020   A/P: check uric acid next visit- has done very well- continue current meds  #elevated PSA-refer to urology February 22, 2020 Lab Results  Component Value Date   PSA 4.64 (H) 02/21/2020   PSA 4.40 (H) 08/09/2019   PSA 3.53 01/20/2019    Recommended follow up: Return in about 6 months (around 03/13/2021) for follow up- or sooner if needed. Future Appointments  Date Time Provider Department Center  03/07/2021 10:15 AM [facility]   Lab/Order associations:   ICD-10-CM   1. Primary hypertension  I10 Comprehensive metabolic panel  2. Idiopathic gout of foot, unspecified chronicity, unspecified laterality  M10.079   3. Hyperglycemia  R73.9 Hemoglobin A1c  4. Hyperlipidemia, unspecified hyperlipidemia type  E78.5   5. Stage 3 chronic kidney disease, unspecified whether stage 3a or 3b CKD (HCC)  N18.30 Comprehensive metabolic panel  6. Elevated PSA  R97.20 PSA   Return precautions advised.  03/15/2021, MD

## 2020-09-13 ENCOUNTER — Ambulatory Visit: Payer: Medicare PPO | Admitting: Family Medicine

## 2020-09-13 ENCOUNTER — Encounter: Payer: Self-pay | Admitting: Family Medicine

## 2020-09-13 ENCOUNTER — Other Ambulatory Visit: Payer: Self-pay

## 2020-09-13 VITALS — BP 136/74 | HR 69 | Temp 97.2°F | Ht 65.0 in | Wt 172.2 lb

## 2020-09-13 DIAGNOSIS — R739 Hyperglycemia, unspecified: Secondary | ICD-10-CM

## 2020-09-13 DIAGNOSIS — E785 Hyperlipidemia, unspecified: Secondary | ICD-10-CM | POA: Diagnosis not present

## 2020-09-13 DIAGNOSIS — N183 Chronic kidney disease, stage 3 unspecified: Secondary | ICD-10-CM | POA: Diagnosis not present

## 2020-09-13 DIAGNOSIS — I10 Essential (primary) hypertension: Secondary | ICD-10-CM

## 2020-09-13 DIAGNOSIS — R972 Elevated prostate specific antigen [PSA]: Secondary | ICD-10-CM | POA: Diagnosis not present

## 2020-09-13 DIAGNOSIS — M10079 Idiopathic gout, unspecified ankle and foot: Secondary | ICD-10-CM | POA: Diagnosis not present

## 2020-09-13 LAB — COMPREHENSIVE METABOLIC PANEL
ALT: 24 U/L (ref 0–53)
AST: 20 U/L (ref 0–37)
Albumin: 4.6 g/dL (ref 3.5–5.2)
Alkaline Phosphatase: 37 U/L — ABNORMAL LOW (ref 39–117)
BUN: 25 mg/dL — ABNORMAL HIGH (ref 6–23)
CO2: 30 mEq/L (ref 19–32)
Calcium: 9.3 mg/dL (ref 8.4–10.5)
Chloride: 104 mEq/L (ref 96–112)
Creatinine, Ser: 1.29 mg/dL (ref 0.40–1.50)
GFR: 55.17 mL/min — ABNORMAL LOW (ref >60.00)
Glucose, Bld: 100 mg/dL — ABNORMAL HIGH (ref 70–99)
Potassium: 4.4 mEq/L (ref 3.5–5.1)
Sodium: 139 mEq/L (ref 135–145)
Total Bilirubin: 0.6 mg/dL (ref 0.2–1.2)
Total Protein: 6.6 g/dL (ref 6.0–8.3)

## 2020-09-13 LAB — HEMOGLOBIN A1C: Hgb A1c MFr Bld: 5.8 % (ref 4.6–6.5)

## 2020-09-13 LAB — PSA: PSA: 6.16 ng/mL — ABNORMAL HIGH (ref 0.10–4.00)

## 2020-09-14 ENCOUNTER — Encounter: Payer: Self-pay | Admitting: Family Medicine

## 2020-09-14 ENCOUNTER — Other Ambulatory Visit: Payer: Self-pay

## 2020-09-14 DIAGNOSIS — R972 Elevated prostate specific antigen [PSA]: Secondary | ICD-10-CM

## 2020-09-18 DIAGNOSIS — R972 Elevated prostate specific antigen [PSA]: Secondary | ICD-10-CM | POA: Diagnosis not present

## 2020-09-18 DIAGNOSIS — N200 Calculus of kidney: Secondary | ICD-10-CM | POA: Diagnosis not present

## 2021-02-14 DIAGNOSIS — H2513 Age-related nuclear cataract, bilateral: Secondary | ICD-10-CM | POA: Diagnosis not present

## 2021-02-14 DIAGNOSIS — H524 Presbyopia: Secondary | ICD-10-CM | POA: Diagnosis not present

## 2021-02-14 DIAGNOSIS — H52203 Unspecified astigmatism, bilateral: Secondary | ICD-10-CM | POA: Diagnosis not present

## 2021-02-14 DIAGNOSIS — H25013 Cortical age-related cataract, bilateral: Secondary | ICD-10-CM | POA: Diagnosis not present

## 2021-03-07 ENCOUNTER — Ambulatory Visit: Payer: Medicare PPO

## 2021-03-26 NOTE — Progress Notes (Signed)
Phone: [phone]   Subjective:  Patient presents today for their annual physical. Chief complaint-noted.   See problem oriented charting- ROS- full  review of systems was completed and negative  per full ROS sheet   The following were reviewed and entered/updated in epic: Past Medical History:  Diagnosis Date   BPH associated with nocturia 10/03/2014   Depression    Hyperlipidemia    Hypertension    PSA, INCREASED 04/16/2010   History prostatitis x 2, # decreased with ciprofloxacin     Rosacea    Patient Active Problem List   Diagnosis Date Noted   Hyperglycemia 10/03/2014    Priority: Medium   BPH associated with nocturia 10/03/2014    Priority: Medium   Gout 06/21/2009    Priority: Medium   Chronic kidney disease, stage III (moderate) (HCC) 04/05/2008    Priority: Medium   Hypertension 05/31/2007    Priority: Medium   Hyperlipidemia 05/24/2007    Priority: Medium   Seasonal allergies 05/11/2014    Priority: Low   PSA, INCREASED 04/16/2010    Priority: Low   DEPRESSION 05/24/2007    Priority: Low   Past Surgical History:  Procedure Laterality Date   FLEXIBLE SIGMOIDOSCOPY     TONSILLECTOMY     at around 62-10 yrs old   WISDOM TOOTH EXTRACTION      Family History  Problem Relation Age of Onset   Heart disease Father        MI 41, smoker   Colon cancer Neg Hx    Esophageal cancer Neg Hx    Rectal cancer Neg Hx    Stomach cancer Neg Hx     Medications- reviewed and updated Current Outpatient Medications  Medication Sig Dispense Refill   allopurinol (ZYLOPRIM) 100 MG tablet TAKE 2 TABLETS BY MOUTH EVERY DAY 180 tablet 3   aspirin 81 MG tablet Take 81 mg by mouth daily.     atorvastatin (LIPITOR) 40 MG tablet TAKE 1 TABLET BY MOUTH EVERY DAY 90 tablet 3   Biotin 5000 MCG TABS Take 1 tablet by mouth.     Calcium Carbonate-Vitamin D (CALCIUM-VITAMIN D) 500-200 MG-UNIT tablet Take 1 tablet by mouth daily.     cyanocobalamin 1000 MCG tablet Take 1,000 mcg by  mouth daily.     ferrous sulfate 325 (65 FE) MG tablet Take 325 mg by mouth daily with breakfast.     glucosamine-chondroitin 500-400 MG tablet Take 1 tablet by mouth 3 (three) times daily.     lisinopril (ZESTRIL) 5 MG tablet TAKE 1 TABLET BY MOUTH EVERY DAY 90 tablet 3   Multiple Vitamins-Minerals (CENTRUM SILVER PO) Take 1 tablet by mouth daily.      No current facility-administered medications for this visit.    Allergies-reviewed and updated No Known Allergies  Social History   Social History Narrative   Married 1978. Son from first marriage [name] (1971-psychiatrist in [place]). No grandkids.    2 dogs-sheltie/collie mix and border collie mix      Retired from Medical laboratory scientific officer: time with dogs, exercising   Objective  Objective:  BP 132/62   Pulse (!) 59   Temp 98 F (36.7 C)   Ht 5\' 5"  (1.651 m)   Wt 170 lb 4 oz (77.2 kg)   SpO2 98%   BMI 28.33 kg/m  Gen: NAD, resting comfortably HEENT: Mucous membranes are moist. Oropharynx normal Neck: no thyromegaly CV: RRR no murmurs rubs or gallops Lungs:  CTAB no crackles, wheeze, rhonchi Abdomen: soft/nontender/nondistended/normal bowel sounds. No rebound or guarding.  Ext: no edema Skin: warm, dry Neuro: grossly normal, moves all extremities, PERRLA   Assessment and Plan  75 y.o. male presenting for annual physical.  Health Maintenance counseling: 1. Anticipatory guidance: Patient counseled regarding regular dental exams -q6 months, eye exams -yearly,  avoiding smoking and second hand smoke , limiting alcohol to 2 beverages per day- one michelob ultra per week , no illicit drugs 2. Risk factor reduction:  Advised patient of need for regular exercise and diet rich and fruits and vegetables to reduce risk of heart attack and stroke. Exercise-  going to the gym- 1.75 hours at the gym 3x a week . Diet-catered meals, trying to eat reasonably healthy.  Wt Readings from Last 3 Encounters:   03/28/21 170 lb 4 oz (77.2 kg)  09/13/20 172 lb 3.2 oz (78.1 kg)  02/21/20 173 lb 8 oz (78.7 kg)  3. Immunizations/screenings/ancillary studies- discussed Shingrix-recommended at pharmacy, discussed COVID-19 4th booster- undecided right now- may get at [facility], discussed flu vaccination-I asked him to let us know if he gets this at an outside location - otherwise up-to-date. Immunization History  Administered Date(s) Administered   Influenza Split 06/05/2011, 05/06/2012   Influenza Whole 08/25/2000, 05/31/2007, 05/17/2009, 06/06/2010   Influenza, High Dose Seasonal PF 05/22/2016, 06/08/2017, 07/03/2018, 04/19/2019, 06/18/2020   Influenza,inj,Quad PF,6+ Mos 08/09/2013, 05/11/2014, 05/24/2015   Influenza-Unspecified 07/05/2017, 04/19/2019   PFIZER(Purple Top)SARS-COV-2 Vaccination 10/16/2019, 11/09/2019, 07/09/2020   Pneumococcal Conjugate-13 10/09/2015   Pneumococcal Polysaccharide-23 05/11/2014   Td 06/25/1998, 04/05/2008   Tdap 04/17/2019  4. Prostate cancer screening-  #elevated PSA- we referred patient back to urology Dr. Berneice Heinrich last year with PSA up to 6-thankfully this trended back down with Dr. Berneice Heinrich.  Follows with [facility] for kidney stones. Will check PSA-  Lab Results  Component Value Date   PSA 6.16 (H) 09/13/2020   PSA 4.64 (H) 02/21/2020   PSA 4.40 (H) 08/09/2019   5. Colon cancer screening - 11/02/2013 with a 10-year repeat  6. Skin cancer screening- no dermatologist. advised regular sunscreen use. Denies worrisome, changing, or new skin lesions.  7. never smoker 8. STD screening - declines as monogamous  Status of chronic or acute concerns   # overweight/ hyperglycemia/prediabetes-peak A1c at least 6.1 S: Patient has done a great job maintaining weight under 175-weight down another 2 pounds from last physical.  A1c's have trended down from peak of 6.1 Lab Results  Component Value Date   HGBA1C 5.8 09/13/2020   HGBA1C 5.8 02/21/2020   HGBA1C 5.8 08/09/2019  A/P:  for prediabetes/hyperglycemia- continue healthy lifestyle changes-update A1c today For overweight-patient continues to do an excellent job with lifestyle changes-encouraged him to continue  #hypertension/CKD III S: compliant with lisinopril 5mg   GFR has been stable in 50s. Ace I in case proteinuric element  Home reading #s: rare checks 120/70 BP Readings from Last 3 Encounters:  03/28/21 132/62  09/13/20 136/74  02/21/20 100/62  A/P: Stable. Continue current medications.   #hyperlipidemia S: Compliant with atorvastatin 40mg   Lab Results  Component Value Date   CHOL 152 02/21/2020   HDL 45.40 02/21/2020   LDLCALC 74 02/21/2020   LDLDIRECT 96.0 01/20/2019   TRIG 159.0 (H) 02/21/2020   CHOLHDL 3 02/21/2020  A/P: very close to ideal control- update lipid panel today  #Gout S: 0 flares in last 6 months on allopurinol 200mg  per day  Lab Results  Component Value Date   [facility]  5.6 02/21/2020   A/P: doing well- update uric acid today. Continue current meds most liekly  Recommended follow up: Return in about 6 months (around 09/28/2021). Follow up  Lab/Order associations: fasting   ICD-10-CM   1. Preventative health care  Z00.00     2. Primary hypertension  I10     3. Hyperlipidemia, unspecified hyperlipidemia type  E78.5     4. Idiopathic gout of foot, unspecified chronicity, unspecified laterality  M10.079     5. Hyperglycemia  R73.9      No orders of the defined types were placed in this encounter.  I,Harris Phan,acting as a Neurosurgeon for Tana Conch, MD.,have documented all relevant documentation on the behalf of Tana Conch, MD,as directed by  Tana Conch, MD while in the presence of Tana Conch, MD.   I, Tana Conch, MD, have reviewed all documentation for this visit. The documentation on 03/28/21 for the exam, diagnosis, procedures, and orders are all accurate and complete.   Return precautions advised.  Tana Conch, MD

## 2021-03-28 ENCOUNTER — Ambulatory Visit (INDEPENDENT_AMBULATORY_CARE_PROVIDER_SITE_OTHER): Payer: Medicare PPO | Admitting: Family Medicine

## 2021-03-28 ENCOUNTER — Ambulatory Visit: Payer: Medicare PPO

## 2021-03-28 ENCOUNTER — Encounter: Payer: Self-pay | Admitting: Family Medicine

## 2021-03-28 ENCOUNTER — Other Ambulatory Visit: Payer: Self-pay

## 2021-03-28 VITALS — BP 132/62 | HR 59 | Temp 98.0°F | Ht 65.0 in | Wt 170.2 lb

## 2021-03-28 DIAGNOSIS — E785 Hyperlipidemia, unspecified: Secondary | ICD-10-CM

## 2021-03-28 DIAGNOSIS — M10079 Idiopathic gout, unspecified ankle and foot: Secondary | ICD-10-CM

## 2021-03-28 DIAGNOSIS — Z0000 Encounter for general adult medical examination without abnormal findings: Secondary | ICD-10-CM | POA: Diagnosis not present

## 2021-03-28 DIAGNOSIS — I10 Essential (primary) hypertension: Secondary | ICD-10-CM

## 2021-03-28 DIAGNOSIS — Z1283 Encounter for screening for malignant neoplasm of skin: Secondary | ICD-10-CM

## 2021-03-28 DIAGNOSIS — R972 Elevated prostate specific antigen [PSA]: Secondary | ICD-10-CM

## 2021-03-28 DIAGNOSIS — R739 Hyperglycemia, unspecified: Secondary | ICD-10-CM | POA: Diagnosis not present

## 2021-03-28 LAB — CBC WITH DIFFERENTIAL/PLATELET
Basophils Absolute: 0 10*3/uL (ref 0.0–0.1)
Basophils Relative: 0.5 % (ref 0.0–3.0)
Eosinophils Absolute: 0.1 10*3/uL (ref 0.0–0.7)
Eosinophils Relative: 1.4 % (ref 0.0–5.0)
HCT: 42.4 % (ref 39.0–52.0)
Hemoglobin: 14.1 g/dL (ref 13.0–17.0)
Lymphocytes Relative: 19.6 % (ref 12.0–46.0)
Lymphs Abs: 1.2 10*3/uL (ref 0.7–4.0)
MCHC: 33.4 g/dL (ref 30.0–36.0)
MCV: 92.7 fl (ref 78.0–100.0)
Monocytes Absolute: 0.5 10*3/uL (ref 0.1–1.0)
Monocytes Relative: 8.7 % (ref 3.0–12.0)
Neutro Abs: 4.4 10*3/uL (ref 1.4–7.7)
Neutrophils Relative %: 69.8 % (ref 43.0–77.0)
Platelets: 200 10*3/uL (ref 150.0–400.0)
RBC: 4.58 Mil/uL (ref 4.22–5.81)
RDW: 13.7 % (ref 11.5–15.5)
WBC: 6.3 10*3/uL (ref 4.0–10.5)

## 2021-03-28 LAB — LIPID PANEL
Cholesterol: 168 mg/dL (ref 0–200)
HDL: 48 mg/dL (ref >39.00)
LDL Cholesterol: 85 mg/dL (ref 0–99)
NonHDL: 119.53
Total CHOL/HDL Ratio: 3
Triglycerides: 173 mg/dL — ABNORMAL HIGH (ref 0.0–149.0)
VLDL: 34.6 mg/dL (ref 0.0–40.0)

## 2021-03-28 LAB — COMPREHENSIVE METABOLIC PANEL
ALT: 22 U/L (ref 0–53)
AST: 21 U/L (ref 0–37)
Albumin: 4.5 g/dL (ref 3.5–5.2)
Alkaline Phosphatase: 34 U/L — ABNORMAL LOW (ref 39–117)
BUN: 24 mg/dL — ABNORMAL HIGH (ref 6–23)
CO2: 27 mEq/L (ref 19–32)
Calcium: 9.4 mg/dL (ref 8.4–10.5)
Chloride: 103 mEq/L (ref 96–112)
Creatinine, Ser: 1.32 mg/dL (ref 0.40–1.50)
GFR: 53.47 mL/min — ABNORMAL LOW (ref >60.00)
Glucose, Bld: 98 mg/dL (ref 70–99)
Potassium: 4.6 mEq/L (ref 3.5–5.1)
Sodium: 139 mEq/L (ref 135–145)
Total Bilirubin: 0.6 mg/dL (ref 0.2–1.2)
Total Protein: 6.6 g/dL (ref 6.0–8.3)

## 2021-03-28 LAB — PSA: PSA: 3.05 ng/mL (ref 0.10–4.00)

## 2021-03-28 LAB — URIC ACID: Uric Acid, Serum: 5.1 mg/dL (ref 4.0–7.8)

## 2021-03-28 LAB — HEMOGLOBIN A1C: Hgb A1c MFr Bld: 5.8 % (ref 4.6–6.5)

## 2021-03-28 NOTE — Patient Instructions (Addendum)
Please stop by lab before you go If you have mychart- we will send your results within 3 business days of [place] receiving them.  If you do not have mychart- we will call you about results within 5 business days of [place] receiving them.  *please also note that you will see labs on mychart as soon as they post. I will later go in and write notes on them- will say "notes from Dr. Durene Cal"  You are eligible to schedule your annual wellness visit with our nurse specialist Inetta Fermo.  Please consider scheduling this before you leave today.  Please consider getting your shingles vaccination.  If you get your flu vaccination at your local pharmacy please let us know.  We will call you within two weeks about your referral to Dermatology. If you do not hear within 2 weeks, give [place] a call.   Recommended follow up : Return within 6 months (around 09/28/2021 ) for a follow-up or sooner if needed.

## 2021-05-22 ENCOUNTER — Other Ambulatory Visit: Payer: Self-pay | Admitting: Family Medicine

## 2021-07-27 ENCOUNTER — Other Ambulatory Visit: Payer: Self-pay | Admitting: Family Medicine

## 2021-08-14 ENCOUNTER — Other Ambulatory Visit: Payer: Self-pay | Admitting: Family Medicine

## 2021-09-25 NOTE — Progress Notes (Signed)
Phone [phone] In person visit   Subjective:   Robert Gaines is a 76 y.o. year old very pleasant male patient who presents for/with See problem oriented charting Chief Complaint  Patient presents with   Follow-up   Hyperlipidemia    This visit occurred during the SARS-CoV-2 public health emergency.  Safety protocols were in place, including screening questions prior to the visit, additional usage of staff PPE, and extensive cleaning of exam room while observing appropriate contact time as indicated for disinfecting solutions.   Past Medical History-  Patient Active Problem List   Diagnosis Date Noted   Hyperglycemia 10/03/2014    Priority: Medium    BPH associated with nocturia 10/03/2014    Priority: Medium    Gout 06/21/2009    Priority: Medium    Chronic kidney disease, stage III (moderate) (HCC) 04/05/2008    Priority: Medium    Hypertension 05/31/2007    Priority: Medium    Hyperlipidemia 05/24/2007    Priority: Medium    Seasonal allergies 05/11/2014    Priority: Low   PSA, INCREASED 04/16/2010    Priority: Low   DEPRESSION 05/24/2007    Priority: Low    Medications- reviewed and updated Current Outpatient Medications  Medication Sig Dispense Refill   allopurinol (ZYLOPRIM) 100 MG tablet TAKE 2 TABLETS BY MOUTH EVERY DAY 180 tablet 3   aspirin 81 MG tablet Take 81 mg by mouth daily.     atorvastatin (LIPITOR) 40 MG tablet TAKE 1 TABLET BY MOUTH EVERY DAY 90 tablet 3   Biotin 5000 MCG TABS Take 1 tablet by mouth.     Calcium Carbonate-Vitamin D (CALCIUM-VITAMIN D) 500-200 MG-UNIT tablet Take 1 tablet by mouth daily.     cyanocobalamin 1000 MCG tablet Take 1,000 mcg by mouth daily.     ferrous sulfate 325 (65 FE) MG tablet Take 325 mg by mouth daily with breakfast.     glucosamine-chondroitin 500-400 MG tablet Take 1 tablet by mouth 3 (three) times daily.     lisinopril (ZESTRIL) 5 MG tablet TAKE 1 TABLET BY MOUTH EVERY DAY 90 tablet 3   Multiple  Vitamins-Minerals (CENTRUM SILVER PO) Take 1 tablet by mouth daily.      No current facility-administered medications for this visit.     Objective:  BP 132/82    Pulse (!) 56    Temp (!) 97.2 F (36.2 C) (Temporal)    Ht 5\' 5"  (1.651 m)    Wt 173 lb 12.8 oz (78.8 kg)    SpO2 99%    BMI 28.92 kg/m  Gen: NAD, resting comfortably CV: RRR no murmurs rubs or gallops Lungs: CTAB no crackles, wheeze, rhonchi Ext: no edema Skin: warm, dry     Assessment and Plan   # overweight/ hyperglycemia/prediabetes-peak A1c at least 6.1 S:  has improved with diet/exercise/weight loss. Weight up just a few lbs over holidays Lab Results  Component Value Date   HGBA1C 5.8 03/28/2021   HGBA1C 5.8 09/13/2020   HGBA1C 5.8 02/21/2020   A/P: hopefully at least stable even with weight gain- update a1c with labs  #hypertension/CKD III S: compliant with lisinopril 5mg  every day   GFR had been stable in 50s. Ace I in case proteinuric element  Home readings #s: not checking regularly  but normal when checks BP Readings from Last 3 Encounters:  10/02/21 132/82  03/28/21 132/62  09/13/20 136/74  A/P: hypertension- Controlled. Continue current medications.  CKD III- hopefully stable- update cmp. No nsaids  advised- does aleve pm 5 days a week- discussed potential cognitive effects   #hyperlipidemia S: Compliant with atorvastatin 40mg  every day  Lab Results  Component Value Date   CHOL 168 03/28/2021   HDL 48.00 03/28/2021   LDLCALC 85 03/28/2021   LDLDIRECT 96.0 01/20/2019   TRIG 173.0 (H) 03/28/2021   CHOLHDL 3 03/28/2021   A/P: LDL slightly above ideal goal of 70 or less at 85-given he has prediabetes hesitant to increase dose as LDL gets above 100-for now continue current medication and repeat lipids at physical  #Gout S: 0 flares in last year at least on allopurinol 200mg  per day Lab Results  Component Value Date   LABURIC 5.1 03/28/2021  A/P: Controlled. Continue current medications.  Uric  acid next visit  #elevated PSA- saw [facility] 09/18/20 and he was advised to follow up but has been caring for wife and challenging- urology notes suggested trending through age 14- we will update next visit Lab Results  Component Value Date   PSA 3.05 03/28/2021   PSA 6.16 (H) 09/13/2020   PSA 4.64 (H) 02/21/2020    #Health maintenance-recommended Shingrix at pharmacy   Recommended follow up: Return in about 6 months (around 04/01/2022) for physical or sooner if needed.  Lab/Order associations:   ICD-10-CM   1. Hyperlipidemia, unspecified hyperlipidemia type  E78.5 Comprehensive metabolic panel    2. Hyperglycemia  R73.9 Hemoglobin A1c    3. Primary hypertension  I10     4. Idiopathic gout of foot, unspecified chronicity, unspecified laterality  M10.079     5. Elevated PSA  R97.20     No refills needed per patient  I,Jada Bradford,acting as a scribe for 02/23/2020, MD.,have documented all relevant documentation on the behalf of 06/01/2022, MD,as directed by  Tana Conch, MD while in the presence of Tana Conch, MD.   I, Tana Conch, MD, have reviewed all documentation for this visit. The documentation on 10/02/21 for the exam, diagnosis, procedures, and orders are all accurate and complete.   Return precautions advised.  Tana Conch, MD

## 2021-10-02 ENCOUNTER — Encounter: Payer: Self-pay | Admitting: Family Medicine

## 2021-10-02 ENCOUNTER — Ambulatory Visit: Payer: Medicare PPO | Admitting: Family Medicine

## 2021-10-02 ENCOUNTER — Other Ambulatory Visit: Payer: Self-pay

## 2021-10-02 VITALS — BP 132/82 | HR 56 | Temp 97.2°F | Ht 65.0 in | Wt 173.8 lb

## 2021-10-02 DIAGNOSIS — I10 Essential (primary) hypertension: Secondary | ICD-10-CM

## 2021-10-02 DIAGNOSIS — R739 Hyperglycemia, unspecified: Secondary | ICD-10-CM | POA: Diagnosis not present

## 2021-10-02 DIAGNOSIS — E785 Hyperlipidemia, unspecified: Secondary | ICD-10-CM

## 2021-10-02 DIAGNOSIS — R972 Elevated prostate specific antigen [PSA]: Secondary | ICD-10-CM | POA: Diagnosis not present

## 2021-10-02 DIAGNOSIS — M10079 Idiopathic gout, unspecified ankle and foot: Secondary | ICD-10-CM

## 2021-10-02 LAB — HEMOGLOBIN A1C: Hgb A1c MFr Bld: 5.9 % (ref 4.6–6.5)

## 2021-10-02 NOTE — Patient Instructions (Addendum)
Health Maintenance Due  Topic Date Due   Zoster Vaccines- Shingrix (1 of 2)  Please check with your pharmacy to see if they have the shingrix vaccine. If they do- please get this immunization and update [place] by phone call or mychart with dates you receive the vaccine  Never done   Please stop by lab before you go If you have mychart- we will send your results within 3 business days of [place] receiving them.  If you do not have mychart- we will call you about results within 5 business days of [place] receiving them.  *please also note that you will see labs on mychart as soon as they post. I will later go in and write notes on them- will say "notes from Dr. Durene Cal"   Recommended follow up: Return in about 6 months (around 04/01/2022) for physical or sooner if needed.

## 2021-10-03 LAB — COMPREHENSIVE METABOLIC PANEL
ALT: 23 U/L (ref 0–53)
AST: 23 U/L (ref 0–37)
Albumin: 4.3 g/dL (ref 3.5–5.2)
Alkaline Phosphatase: 34 U/L — ABNORMAL LOW (ref 39–117)
BUN: 22 mg/dL (ref 6–23)
CO2: 31 mEq/L (ref 19–32)
Calcium: 9.3 mg/dL (ref 8.4–10.5)
Chloride: 105 mEq/L (ref 96–112)
Creatinine, Ser: 1.3 mg/dL (ref 0.40–1.50)
GFR: 54.26 mL/min — ABNORMAL LOW (ref >60.00)
Glucose, Bld: 94 mg/dL (ref 70–99)
Potassium: 4.3 mEq/L (ref 3.5–5.1)
Sodium: 141 mEq/L (ref 135–145)
Total Bilirubin: 0.6 mg/dL (ref 0.2–1.2)
Total Protein: 6.4 g/dL (ref 6.0–8.3)

## 2021-12-16 ENCOUNTER — Telehealth: Payer: Self-pay | Admitting: Family Medicine

## 2021-12-16 NOTE — Telephone Encounter (Signed)
Copied from CRM [id number]. Topic: Medicare AWV ?>> Dec 16, 2021  1:40 PM Harris-Coley, Avon Gully wrote: ?Reason for CRM: Left message for patient to schedule Annual Wellness Visit.  Please schedule with Nurse Health Advisor Lanier Ensign, RN at [facility].  Please call [phone] ask for Olegario Messier ?

## 2022-02-20 DIAGNOSIS — H52203 Unspecified astigmatism, bilateral: Secondary | ICD-10-CM | POA: Diagnosis not present

## 2022-02-20 DIAGNOSIS — H524 Presbyopia: Secondary | ICD-10-CM | POA: Diagnosis not present

## 2022-02-20 DIAGNOSIS — H25813 Combined forms of age-related cataract, bilateral: Secondary | ICD-10-CM | POA: Diagnosis not present

## 2022-03-25 ENCOUNTER — Telehealth: Payer: Self-pay | Admitting: Family Medicine

## 2022-03-25 DIAGNOSIS — R972 Elevated prostate specific antigen [PSA]: Secondary | ICD-10-CM

## 2022-03-25 DIAGNOSIS — R739 Hyperglycemia, unspecified: Secondary | ICD-10-CM

## 2022-03-25 DIAGNOSIS — Z0000 Encounter for general adult medical examination without abnormal findings: Secondary | ICD-10-CM

## 2022-03-25 DIAGNOSIS — M10079 Idiopathic gout, unspecified ankle and foot: Secondary | ICD-10-CM

## 2022-03-25 DIAGNOSIS — E785 Hyperlipidemia, unspecified: Secondary | ICD-10-CM

## 2022-03-25 DIAGNOSIS — I10 Essential (primary) hypertension: Secondary | ICD-10-CM

## 2022-03-25 NOTE — Telephone Encounter (Signed)
Labs have been ordered, ok to schedule. 

## 2022-03-25 NOTE — Telephone Encounter (Signed)
Caller states his physical is on 08/08 @1pm  and he did not want to have to go all day without eating. Patient is requesting clearance to have labs completed that morning instead of that afternoon. Can this be arranged?

## 2022-03-31 ENCOUNTER — Other Ambulatory Visit (INDEPENDENT_AMBULATORY_CARE_PROVIDER_SITE_OTHER): Payer: Medicare PPO

## 2022-03-31 DIAGNOSIS — R972 Elevated prostate specific antigen [PSA]: Secondary | ICD-10-CM

## 2022-03-31 DIAGNOSIS — M10079 Idiopathic gout, unspecified ankle and foot: Secondary | ICD-10-CM

## 2022-03-31 DIAGNOSIS — R739 Hyperglycemia, unspecified: Secondary | ICD-10-CM

## 2022-03-31 DIAGNOSIS — Z0000 Encounter for general adult medical examination without abnormal findings: Secondary | ICD-10-CM

## 2022-03-31 DIAGNOSIS — I10 Essential (primary) hypertension: Secondary | ICD-10-CM

## 2022-03-31 DIAGNOSIS — E785 Hyperlipidemia, unspecified: Secondary | ICD-10-CM

## 2022-03-31 LAB — COMPREHENSIVE METABOLIC PANEL
ALT: 21 U/L (ref 0–53)
AST: 20 U/L (ref 0–37)
Albumin: 4.5 g/dL (ref 3.5–5.2)
Alkaline Phosphatase: 34 U/L — ABNORMAL LOW (ref 39–117)
BUN: 23 mg/dL (ref 6–23)
CO2: 27 mEq/L (ref 19–32)
Calcium: 9.4 mg/dL (ref 8.4–10.5)
Chloride: 103 mEq/L (ref 96–112)
Creatinine, Ser: 1.37 mg/dL (ref 0.40–1.50)
GFR: 50.77 mL/min — ABNORMAL LOW (ref >60.00)
Glucose, Bld: 99 mg/dL (ref 70–99)
Potassium: 5.3 mEq/L — ABNORMAL HIGH (ref 3.5–5.1)
Sodium: 143 mEq/L (ref 135–145)
Total Bilirubin: 0.6 mg/dL (ref 0.2–1.2)
Total Protein: 6.4 g/dL (ref 6.0–8.3)

## 2022-03-31 LAB — URIC ACID: Uric Acid, Serum: 5.3 mg/dL (ref 4.0–7.8)

## 2022-03-31 LAB — CBC WITH DIFFERENTIAL/PLATELET
Basophils Absolute: 0 10*3/uL (ref 0.0–0.1)
Basophils Relative: 0.4 % (ref 0.0–3.0)
Eosinophils Absolute: 0.1 10*3/uL (ref 0.0–0.7)
Eosinophils Relative: 2.3 % (ref 0.0–5.0)
HCT: 41.7 % (ref 39.0–52.0)
Hemoglobin: 14.4 g/dL (ref 13.0–17.0)
Lymphocytes Relative: 23.9 % (ref 12.0–46.0)
Lymphs Abs: 1.5 10*3/uL (ref 0.7–4.0)
MCHC: 34.5 g/dL (ref 30.0–36.0)
MCV: 92.6 fl (ref 78.0–100.0)
Monocytes Absolute: 0.5 10*3/uL (ref 0.1–1.0)
Monocytes Relative: 8.5 % (ref 3.0–12.0)
Neutro Abs: 4.1 10*3/uL (ref 1.4–7.7)
Neutrophils Relative %: 64.9 % (ref 43.0–77.0)
Platelets: 214 10*3/uL (ref 150.0–400.0)
RBC: 4.51 Mil/uL (ref 4.22–5.81)
RDW: 13.1 % (ref 11.5–15.5)
WBC: 6.3 10*3/uL (ref 4.0–10.5)

## 2022-03-31 LAB — HEMOGLOBIN A1C: Hgb A1c MFr Bld: 6 % (ref 4.6–6.5)

## 2022-03-31 LAB — LIPID PANEL
Cholesterol: 152 mg/dL (ref 0–200)
HDL: 47.2 mg/dL (ref >39.00)
LDL Cholesterol: 79 mg/dL (ref 0–99)
NonHDL: 104.4
Total CHOL/HDL Ratio: 3
Triglycerides: 126 mg/dL (ref 0.0–149.0)
VLDL: 25.2 mg/dL (ref 0.0–40.0)

## 2022-03-31 LAB — PSA: PSA: 3.23 ng/mL (ref 0.10–4.00)

## 2022-04-01 ENCOUNTER — Ambulatory Visit (INDEPENDENT_AMBULATORY_CARE_PROVIDER_SITE_OTHER): Payer: Medicare PPO | Admitting: Family Medicine

## 2022-04-01 ENCOUNTER — Encounter: Payer: Self-pay | Admitting: Family Medicine

## 2022-04-01 VITALS — BP 130/60 | HR 74 | Temp 98.4°F | Ht 65.0 in | Wt 174.0 lb

## 2022-04-01 DIAGNOSIS — Z0000 Encounter for general adult medical examination without abnormal findings: Secondary | ICD-10-CM | POA: Diagnosis not present

## 2022-04-01 DIAGNOSIS — R739 Hyperglycemia, unspecified: Secondary | ICD-10-CM

## 2022-04-01 DIAGNOSIS — I10 Essential (primary) hypertension: Secondary | ICD-10-CM | POA: Diagnosis not present

## 2022-04-01 NOTE — Patient Instructions (Addendum)
Flu shot- we should have these available within a month or two but please let us know if you get at outside pharmacy if you choose to get this  [place] job overall- work on mild weight loss  Recommended follow up: Return in about 6 months (around 10/02/2022) for followup or sooner if needed.Schedule b4 you leave.  - try to schedule labs 6 months from 03/31/22- I ordered labs today

## 2022-04-01 NOTE — Progress Notes (Signed)
Phone: [phone]   Subjective:  Patient presents today for their annual physical. Chief complaint-noted.   See problem oriented charting- ROS- full  review of systems was completed and negative  except for: some allergies- doing allegra instead of claritin  The following were reviewed and entered/updated in epic: Past Medical History:  Diagnosis Date   BPH associated with nocturia 10/03/2014   Depression    Hyperlipidemia    Hypertension    PSA, INCREASED 04/16/2010   History prostatitis x 2, # decreased with ciprofloxacin     Rosacea    Patient Active Problem List   Diagnosis Date Noted   Hyperglycemia 10/03/2014    Priority: Medium    BPH associated with nocturia 10/03/2014    Priority: Medium    Gout 06/21/2009    Priority: Medium    Chronic kidney disease, stage III (moderate) (HCC) 04/05/2008    Priority: Medium    Hypertension 05/31/2007    Priority: Medium    Hyperlipidemia 05/24/2007    Priority: Medium    Seasonal allergies 05/11/2014    Priority: Low   PSA, INCREASED 04/16/2010    Priority: Low   DEPRESSION 05/24/2007    Priority: Low   Past Surgical History:  Procedure Laterality Date   FLEXIBLE SIGMOIDOSCOPY     TONSILLECTOMY     at around 69-10 yrs old   WISDOM TOOTH EXTRACTION      Family History  Problem Relation Age of Onset   Heart disease Father        MI 63, smoker   Colon cancer Neg Hx    Esophageal cancer Neg Hx    Rectal cancer Neg Hx    Stomach cancer Neg Hx     Medications- reviewed and updated Current Outpatient Medications  Medication Sig Dispense Refill   allopurinol (ZYLOPRIM) 100 MG tablet TAKE 2 TABLETS BY MOUTH EVERY DAY 180 tablet 3   aspirin 81 MG tablet Take 81 mg by mouth daily.     atorvastatin (LIPITOR) 40 MG tablet TAKE 1 TABLET BY MOUTH EVERY DAY 90 tablet 3   Biotin 5000 MCG TABS Take 1 tablet by mouth.     Calcium Carbonate-Vitamin D (CALCIUM-VITAMIN D) 500-200 MG-UNIT tablet Take 1 tablet by mouth daily.      cyanocobalamin 1000 MCG tablet Take 1,000 mcg by mouth daily.     ferrous sulfate 325 (65 FE) MG tablet Take 325 mg by mouth daily with breakfast.     glucosamine-chondroitin 500-400 MG tablet Take 1 tablet by mouth 3 (three) times daily.     lisinopril (ZESTRIL) 5 MG tablet TAKE 1 TABLET BY MOUTH EVERY DAY 90 tablet 3   Multiple Vitamins-Minerals (CENTRUM SILVER PO) Take 1 tablet by mouth daily.      No current facility-administered medications for this visit.    Allergies-reviewed and updated No Known Allergies  Social History   Social History Narrative   Married 1978. Son from first marriage [name] (1971-psychiatrist in [place]). No grandkids.    2 dogs-sheltie/collie mix and border collie mix      Retired from Medical laboratory scientific officer: time with dogs, exercising   Objective  Objective:  BP 130/60   Pulse 74   Temp 98.4 F (36.9 C)   Ht 5\' 5"  (1.651 m)   Wt 174 lb (78.9 kg)   SpO2 95%   BMI 28.96 kg/m  Gen: NAD, resting comfortably HEENT: Mucous membranes are moist. Oropharynx normal Neck: no thyromegaly CV:  RRR no murmurs rubs or gallops Lungs: CTAB no crackles, wheeze, rhonchi Abdomen: soft/nontender/nondistended/normal bowel sounds. No rebound or guarding.  Ext: no edema Skin: warm, dry Neuro: grossly normal, moves all extremities, PERRLA   Assessment and Plan  76 y.o. male presenting for annual physical.  Health Maintenance counseling: 1. Anticipatory guidance: Patient counseled regarding regular dental exams -q6 months, eye exams - seen recently for yearly,  avoiding smoking and second hand smoke , limiting alcohol to 2 beverages per day-1 coors light per week, no illicit drugs .   2. Risk factor reduction:  Advised patient of need for regular exercise and diet rich and fruits and vegetables to reduce risk of heart attack and stroke.  Exercise- nearly 2 hours in the gym 3 times a week- other than occasional ortho set back Diet/weight  management-tries to eat reasonably healthy-weight within 4 pounds of last year. He wants to lose slightly- 172 at home and wants that to be 170 Wt Readings from Last 3 Encounters:  04/01/22 174 lb (78.9 kg)  10/02/21 173 lb 12.8 oz (78.8 kg)  03/28/21 170 lb 4 oz (77.2 kg)  3. Immunizations/screenings/ancillary studies-consider COVID and flu shot around [place] sure both are updated - he may hold off this year on both. Wants to hold off on shingrix Immunization History  Administered Date(s) Administered   Influenza Split 06/05/2011, 05/06/2012   Influenza Whole 08/25/2000, 05/31/2007, 05/17/2009, 06/06/2010   Influenza, High Dose Seasonal PF 05/22/2016, 06/08/2017, 07/03/2018, 04/19/2019, 06/18/2020   Influenza,inj,Quad PF,6+ Mos 08/09/2013, 05/11/2014, 05/24/2015   Influenza-Unspecified 07/05/2017, 04/19/2019   PFIZER(Purple Top)SARS-COV-2 Vaccination 10/16/2019, 11/09/2019, 07/09/2020   Pneumococcal Conjugate-13 10/09/2015   Pneumococcal Polysaccharide-23 05/11/2014   Td 06/25/1998, 04/05/2008   Tdap 04/17/2019  4. Prostate cancer screening- low risk PSA trend overall-in the past had spiked and referred to [facility]-after trended back down we have followed here Lab Results  Component Value Date   PSA 3.23 03/31/2022   PSA 3.05 03/28/2021   PSA 6.16 (H) 09/13/2020   5. Colon cancer screening - 11/02/2013 with 10 year repeat planned 6. Skin cancer screening- no dermatologist. advised regular sunscreen use. Denies worrisome, changing, or new skin lesions.  7. Smoking associated screening (lung cancer screening, AAA screen 65-75, UA)- Never smoker 8. STD screening - only active with wife  Status of chronic or acute concerns   # overweight/ hyperglycemia/prediabetes-peak A1c at least 6.1 S: See lifestyle discussion above  Lab Results  Component Value Date   HGBA1C 6.0 03/31/2022   HGBA1C 5.9 10/02/2021   HGBA1C 5.8 03/28/2021  A/P: for prediabetes/hyperglycemia-mild  increase-  Encouraged need for continued healthy eating, regular exercise, weight loss.   -Statin due to increased diabetes risk but I think benefits outweigh risks-do not want to push dose though as a result  #hypertension/CKD III S: compliant with lisinopril 5mg   GFR has been stable in 50s. Ace I in case proteinuric element  A/P: Blood pressure- Controlled. Continue current medications.   CKD stage III-knows to avoid NSAIDs (prefers sparing aleve but recommended against)  Stable labs with filtration rate in the 50s - Did have some hyperkalemia and does take lisinopril but also do suspect lab error-offered repeat today-  opts out  #hyperlipidemia S:  Compliant with atorvastatin 40mg   Lab Results  Component Value Date   CHOL 152 03/31/2022   HDL 47.20 03/31/2022   LDLCALC 79 03/31/2022   LDLDIRECT 96.0 01/20/2019   TRIG 126.0 03/31/2022   CHOLHDL 3 03/31/2022  A/P: Lipids reasonably well-controlled-continue  current medication-as above hesitate to increase dose to place for ideal LDL under 70  #Gout S: 0 flares in last 6 months on allopurinol 200mg  per day Lab Results  Component Value Date   LABURIC 5.3 03/31/2022  A/P: Controlled. Continue current medications.    Recommended follow up: Return in about 6 months (around 10/02/2022) for followup or sooner if needed.Schedule b4 you leave.  Lab/Order associations: Already had labs   ICD-10-CM   1. Preventative health care  Z00.00     2. Primary hypertension  I10 Comprehensive metabolic panel    3. Hyperglycemia  R73.9 Hemoglobin A1c     No orders of the defined types were placed in this encounter.  Return precautions advised.  12/01/2022, MD

## 2022-04-04 ENCOUNTER — Ambulatory Visit (INDEPENDENT_AMBULATORY_CARE_PROVIDER_SITE_OTHER): Payer: Medicare PPO

## 2022-04-04 DIAGNOSIS — Z0000 Encounter for general adult medical examination without abnormal findings: Secondary | ICD-10-CM | POA: Diagnosis not present

## 2022-04-04 NOTE — Progress Notes (Signed)
Virtual Visit via Telephone Note  I connected with  Robert Gaines on 04/04/22 at  9:15 AM EDT by telephone and verified that I am speaking with the correct person using two identifiers.  Medicare Annual Wellness visit completed telephonically due to Covid-19 pandemic.   Persons participating in this call: This Health Coach and this patient.   Location: Patient: home Provider: office   I discussed the limitations, risks, security and privacy concerns of performing an evaluation and management service by telephone and the availability of in person appointments. The patient expressed understanding and agreed to proceed.  Unable to perform video visit due to video visit attempted and failed and/or patient does not have video capability.   Some vital signs may be absent or patient reported.   [name], LPN   Subjective:   Robert Gaines is a 76 y.o. male who presents for Medicare Annual/Subsequent preventive examination.  Review of Systems     Cardiac Risk Factors include: advanced age (>30men, >55 women);hypertension;dyslipidemia;male gender     Objective:    There were no vitals filed for this visit. There is no height or weight on file to calculate BMI.     04/04/2022    9:22 AM 02/23/2020   12:27 PM 11/29/2015    8:58 AM 11/20/2015    6:07 PM  Advanced Directives  Does Patient Have a Medical Advance Directive? Yes Yes No No  Type of Advance Directive Healthcare Power of Attorney Living will;Healthcare Power of Attorney    Copy of Healthcare Power of Attorney in Chart? No - copy requested No - copy requested    Would patient like information on creating a medical advance directive?   No - patient declined information No - patient declined information    Current Medications (verified) Outpatient Encounter Medications as of 04/04/2022  Medication Sig   allopurinol (ZYLOPRIM) 100 MG tablet TAKE 2 TABLETS BY MOUTH EVERY DAY   aspirin 81 MG tablet Take 81 mg by mouth  daily.   atorvastatin (LIPITOR) 40 MG tablet TAKE 1 TABLET BY MOUTH EVERY DAY   Biotin 5000 MCG TABS Take 1 tablet by mouth.   Calcium Carbonate-Vitamin D (CALCIUM-VITAMIN D) 500-200 MG-UNIT tablet Take 1 tablet by mouth daily.   cyanocobalamin 1000 MCG tablet Take 1,000 mcg by mouth daily.   ferrous sulfate 325 (65 FE) MG tablet Take 325 mg by mouth daily with breakfast.   glucosamine-chondroitin 500-400 MG tablet Take 1 tablet by mouth 3 (three) times daily.   lisinopril (ZESTRIL) 5 MG tablet TAKE 1 TABLET BY MOUTH EVERY DAY   Multiple Vitamins-Minerals (CENTRUM SILVER PO) Take 1 tablet by mouth daily.    No facility-administered encounter medications on file as of 04/04/2022.    Allergies (verified) Patient has no known allergies.   History: Past Medical History:  Diagnosis Date   BPH associated with nocturia 10/03/2014   Depression    Hyperlipidemia    Hypertension    PSA, INCREASED 04/16/2010   History prostatitis x 2, # decreased with ciprofloxacin     Rosacea    Past Surgical History:  Procedure Laterality Date   FLEXIBLE SIGMOIDOSCOPY     TONSILLECTOMY     at around 18-10 yrs old   WISDOM TOOTH EXTRACTION     Family History  Problem Relation Age of Onset   Heart disease Father        MI 54, smoker   Colon cancer Neg Hx    Esophageal cancer Neg Hx  Rectal cancer Neg Hx    Stomach cancer Neg Hx    Social History   Socioeconomic History   Marital status: Married    Spouse name: Not on file   Number of children: Not on file   Years of education: Not on file   Highest education level: Not on file  Occupational History   Occupation: Retired  Tobacco Use   Smoking status: Never   Smokeless tobacco: Never  Substance and Sexual Activity   Alcohol use: Yes    Comment: occas   Drug use: No   Sexual activity: Not on file  Other Topics Concern   Not on file  Social History Narrative   Married 1978. Son from first marriage [name] (1971-psychiatrist in  [place]). No grandkids.    2 dogs-sheltie/collie mix and border collie mix      Retired from Special educational needs teacher      Hobbies: time with dogs, exercising   Social Determinants of Health   Financial Resource Strain: Low Risk  (04/04/2022)   Overall Financial Resource Strain (CARDIA)    Difficulty of Paying Living Expenses: Not hard at all  Food Insecurity: No Food Insecurity (04/04/2022)   Hunger Vital Sign    Worried About Running Out of Food in the Last Year: Never true    Ran Out of Food in the Last Year: Never true  Transportation Needs: No Transportation Needs (04/04/2022)   PRAPARE - Administrator, Civil Service (Medical): No    Lack of Transportation (Non-Medical): No  Physical Activity: Sufficiently Active (04/04/2022)   Exercise Vital Sign    Days of Exercise per Week: 3 days    Minutes of Exercise per Session: 90 min  Stress: No Stress Concern Present (04/04/2022)   [employer] of Occupational Health - Occupational Stress Questionnaire    Feeling of Stress : Not at all  Social Connections: Moderately Isolated (04/04/2022)   Social Connection and Isolation Panel [NHANES]    Frequency of Communication with Friends and Family: Twice a week    Frequency of Social Gatherings with Friends and Family: More than three times a week    Attends Religious Services: Never    Database administrator or Organizations: No    Attends Engineer, structural: Never    Marital Status: Married    Tobacco Counseling Counseling given: Not Answered   Clinical Intake:  Pre-visit preparation completed: Yes  Pain : No/denies pain     BMI - recorded: 28.96 Nutritional Status: BMI 25 -29 Overweight Nutritional Risks: None Diabetes: No  How often do you need to have someone help you when you read instructions, pamphlets, or other written materials from your doctor or pharmacy?: 1 - Never  Diabetic?no  Interpreter Needed?: No  Information entered by  :: Lanier Ensign, LPN   Activities of Daily Living    04/04/2022    9:23 AM  In your present state of health, do you have any difficulty performing the following activities:  Hearing? 0  Vision? 0  Difficulty concentrating or making decisions? 0  Walking or climbing stairs? 0  Dressing or bathing? 0  Doing errands, shopping? 0  Preparing Food and eating ? N  Using the Toilet? N  In the past six months, have you accidently leaked urine? N  Do you have problems with loss of bowel control? N  Managing your Medications? N  Managing your Finances? N  Housekeeping or managing your Housekeeping? N  Patient Care Team: Shelva Majestic, MD as PCP - General (Family Medicine)  Indicate any recent Medical Services you may have received from other than [facility] providers in the past year (date may be approximate).     Assessment:   This is a routine wellness examination for [employer].  Hearing/Vision screen Hearing Screening - Comments:: Pt denies any hearing issues  Vision Screening - Comments:: Pt follows up with Dr Jethro Bolus   Dietary issues and exercise activities discussed: Current Exercise Habits: Home exercise routine, Type of exercise: strength training/weights;walking;stretching, Time (Minutes): > 60, Frequency (Times/Week): 3, Weekly Exercise (Minutes/Week): 0   Goals Addressed             This Visit's Progress    Patient Stated       None at this time        Depression Screen    04/04/2022    9:21 AM 10/02/2021    8:21 AM 09/13/2020    9:09 AM 02/23/2020   12:24 PM 02/21/2020    8:14 AM 01/20/2019    8:20 AM 01/08/2018    9:21 AM  PHQ 2/9 Scores  PHQ - 2 Score 0 0 0 0 0 0 0  PHQ- 9 Score  0  0 0 0 0    Fall Risk    04/04/2022    9:23 AM 10/02/2021    8:22 AM 03/28/2021    8:13 AM 09/13/2020    9:08 AM 02/23/2020   12:30 PM  Fall Risk   Falls in the past year? 0 0 0 0 0  Number falls in past yr: 0 0  0 0  Injury with Fall? 0 0  0 0  Risk for fall due to :  No  Fall Risks   No Fall Risks  Follow up Falls prevention discussed    Falls prevention discussed    FALL RISK PREVENTION PERTAINING TO THE HOME:  Any stairs in or around the home? Yes  If so, are there any without handrails? No  Home free of loose throw rugs in walkways, pet beds, electrical cords, etc? Yes  Adequate lighting in your home to reduce risk of falls? Yes   ASSISTIVE DEVICES UTILIZED TO PREVENT FALLS:  Life alert? No  Use of a cane, walker or w/c? No  Grab bars in the bathroom? No  Shower chair or bench in shower? No  Elevated toilet seat or a handicapped toilet? No   TIMED UP AND GO:  Was the test performed? No .  Cognitive Function:        04/04/2022    9:24 AM 02/23/2020   12:34 PM  6CIT Screen  What Year? 0 points 0 points  What month? 0 points 0 points  What time? 0 points 0 points  Count back from 20 0 points 0 points  Months in reverse 0 points 0 points  Repeat phrase 0 points   Total Score 0 points     Immunizations Immunization History  Administered Date(s) Administered   Influenza Split 06/05/2011, 05/06/2012   Influenza Whole 08/25/2000, 05/31/2007, 05/17/2009, 06/06/2010   Influenza, High Dose Seasonal PF 05/22/2016, 06/08/2017, 07/03/2018, 04/19/2019, 06/18/2020   Influenza,inj,Quad PF,6+ Mos 08/09/2013, 05/11/2014, 05/24/2015   Influenza-Unspecified 07/05/2017, 04/19/2019   PFIZER(Purple Top)SARS-COV-2 Vaccination 10/16/2019, 11/09/2019, 07/09/2020   Pneumococcal Conjugate-13 10/09/2015   Pneumococcal Polysaccharide-23 05/11/2014   Td 06/25/1998, 04/05/2008   Tdap 04/17/2019    TDAP status: Up to date  Flu Vaccine status: Due, Education has been provided  regarding the importance of this vaccine. Advised may receive this vaccine at local pharmacy or Health Dept. Aware to provide a copy of the vaccination record if obtained from local pharmacy or Health Dept. Verbalized acceptance and understanding.  Pneumococcal vaccine status: Up to  date  Covid-19 vaccine status: Completed vaccines  Qualifies for Shingles Vaccine? Yes   Zostavax completed No   Shingrix Completed?: No.    Education has been provided regarding the importance of this vaccine. Patient has been advised to call insurance company to determine out of pocket expense if they have not yet received this vaccine. Advised may also receive vaccine at local pharmacy or Health Dept. Verbalized acceptance and understanding.  Screening Tests Health Maintenance  Topic Date Due   COVID-19 Vaccine (4 - Pfizer series) 04/17/2022 (Originally 09/03/2020)   Zoster Vaccines- Shingrix (1 of 2) 07/02/2022 (Originally 08/12/1997)   INFLUENZA VACCINE  11/23/2022 (Originally 03/25/2022)   COLONOSCOPY (Pts 45-35yrs Insurance coverage will need to be confirmed)  11/03/2023   TETANUS/TDAP  04/16/2029   Pneumonia Vaccine 65+ Years old  Completed   Hepatitis C Screening  Completed   HPV VACCINES  Aged Out    Health Maintenance  There are no preventive care reminders to display for this patient.  Colorectal cancer screening: Type of screening: Colonoscopy. Completed 11/02/13. Repeat every 10 years   Additional Screening:  Hepatitis C Screening:  Completed 01/10/16  Vision Screening: Recommended annual ophthalmology exams for early detection of glaucoma and other disorders of the eye. Is the patient up to date with their annual eye exam?  Yes  Who is the provider or what is the name of the office in which the patient attends annual eye exams? Dr Jethro Bolus  If pt is not established with a provider, would they like to be referred to a provider to establish care? No .   Dental Screening: Recommended annual dental exams for proper oral hygiene  Community Resource Referral / Chronic Care Management: CRR required this visit?  No   CCM required this visit?  No      Plan:     I have personally reviewed and noted the following in the patient's chart:   Medical and social  history Use of alcohol, tobacco or illicit drugs  Current medications and supplements including opioid prescriptions. Patient is not currently taking opioid prescriptions. Functional ability and status Nutritional status Physical activity Advanced directives List of other physicians Hospitalizations, surgeries, and ER visits in previous 12 months Vitals Screenings to include cognitive, depression, and falls Referrals and appointments  In addition, I have reviewed and discussed with patient certain preventive protocols, quality metrics, and best practice recommendations. A written personalized care plan for preventive services as well as general preventive health recommendations were provided to patient.     [name], LPN   03/03/6268   Nurse Notes: none

## 2022-04-04 NOTE — Patient Instructions (Signed)
[name] , Thank you for taking time to come for your Medicare Wellness Visit. I appreciate your ongoing commitment to your health goals. Please review the following plan we discussed and let me know if I can assist you in the future.   Screening recommendations/referrals: Colonoscopy: done 11/02/13 repeat every 10 years  Recommended yearly ophthalmology/optometry visit for glaucoma screening and checkup Recommended yearly dental visit for hygiene and checkup  Vaccinations: Influenza vaccine: due  Pneumococcal vaccine: Up to date Tdap vaccine: done 04/17/19 repeat every 10 years  Shingles vaccine: Shingrix discussed. Please contact your pharmacy for coverage information.  Covid-19: completed 2/21, 3/17, 07/09/20  Advanced directives: Please bring a copy of your health care power of attorney and living will to the office at your convenience.  Conditions/risks identified: none at this time   Next appointment: Follow up in one year for your annual wellness visit.   Preventive Care 75 Years and Older, Male Preventive care refers to lifestyle choices and visits with your health care provider that can promote health and wellness. What does preventive care include? A yearly physical exam. This is also called an annual well check. Dental exams once or twice a year. Routine eye exams. Ask your health care provider how often you should have your eyes checked. Personal lifestyle choices, including: Daily care of your teeth and gums. Regular physical activity. Eating a healthy diet. Avoiding tobacco and drug use. Limiting alcohol use. Practicing safe sex. Taking low doses of aspirin every day. Taking vitamin and mineral supplements as recommended by your health care provider. What happens during an annual well check? The services and screenings done by your health care provider during your annual well check will depend on your age, overall health, lifestyle risk factors, and family history  of disease. Counseling  Your health care provider may ask you questions about your: Alcohol use. Tobacco use. Drug use. Emotional well-being. Home and relationship well-being. Sexual activity. Eating habits. History of falls. Memory and ability to understand (cognition). Work and work Astronomer. Screening  You may have the following tests or measurements: Height, weight, and BMI. Blood pressure. Lipid and cholesterol levels. These may be checked every 5 years, or more frequently if you are over 75 years old. Skin check. Lung cancer screening. You may have this screening every year starting at age 44 if you have a 30-pack-year history of smoking and currently smoke or have quit within the past 15 years. Fecal occult blood test (FOBT) of the stool. You may have this test every year starting at age 18. Flexible sigmoidoscopy or colonoscopy. You may have a sigmoidoscopy every 5 years or a colonoscopy every 10 years starting at age 75. Prostate cancer screening. Recommendations will vary depending on your family history and other risks. Hepatitis C blood test. Hepatitis B blood test. Sexually transmitted disease (STD) testing. Diabetes screening. This is done by checking your blood sugar (glucose) after you have not eaten for a while (fasting). You may have this done every 1-3 years. Abdominal aortic aneurysm (AAA) screening. You may need this if you are a current or former smoker. Osteoporosis. You may be screened starting at age 2 if you are at high risk. Talk with your health care provider about your test results, treatment options, and if necessary, the need for more tests. Vaccines  Your health care provider may recommend certain vaccines, such as: Influenza vaccine. This is recommended every year. Tetanus, diphtheria, and acellular pertussis (Tdap, Td) vaccine. You may need a Td  booster every 10 years. Zoster vaccine. You may need this after age 28. Pneumococcal 13-valent  conjugate (PCV13) vaccine. One dose is recommended after age 31. Pneumococcal polysaccharide (PPSV23) vaccine. One dose is recommended after age 48. Talk to your health care provider about which screenings and vaccines you need and how often you need them. This information is not intended to replace advice given to you by your health care provider. Make sure you discuss any questions you have with your health care provider. Document Released: 09/07/2015 Document Revised: 04/30/2016 Document Reviewed: 06/12/2015 Elsevier Interactive Patient Education  2017 [employer] Prevention in the Home Falls can cause injuries. They can happen to people of all ages. There are many things you can do to make your home safe and to help prevent falls. What can I do on the outside of my home? Regularly fix the edges of walkways and driveways and fix any cracks. Remove anything that might make you trip as you walk through a door, such as a raised step or threshold. Trim any bushes or trees on the path to your home. Use bright outdoor lighting. Clear any walking paths of anything that might make someone trip, such as rocks or tools. Regularly check to see if handrails are loose or broken. Make sure that both sides of any steps have handrails. Any raised decks and porches should have guardrails on the edges. Have any leaves, snow, or ice cleared regularly. Use sand or salt on walking paths during winter. Clean up any spills in your garage right away. This includes oil or grease spills. What can I do in the bathroom? Use night lights. Install grab bars by the toilet and in the tub and shower. Do not use towel bars as grab bars. Use non-skid mats or decals in the tub or shower. If you need to sit down in the shower, use a plastic, non-slip stool. Keep the floor dry. Clean up any water that spills on the floor as soon as it happens. Remove soap buildup in the tub or shower regularly. Attach bath mats  securely with double-sided non-slip rug tape. Do not have throw rugs and other things on the floor that can make you trip. What can I do in the bedroom? Use night lights. Make sure that you have a light by your bed that is easy to reach. Do not use any sheets or blankets that are too big for your bed. They should not hang down onto the floor. Have a firm chair that has side arms. You can use this for support while you get dressed. Do not have throw rugs and other things on the floor that can make you trip. What can I do in the kitchen? Clean up any spills right away. Avoid walking on wet floors. Keep items that you use a lot in easy-to-reach places. If you need to reach something above you, use a strong step stool that has a grab bar. Keep electrical cords out of the way. Do not use floor polish or wax that makes floors slippery. If you must use wax, use non-skid floor wax. Do not have throw rugs and other things on the floor that can make you trip. What can I do with my stairs? Do not leave any items on the stairs. Make sure that there are handrails on both sides of the stairs and use them. Fix handrails that are broken or loose. Make sure that handrails are as long as the stairways. Check any carpeting  to make sure that it is firmly attached to the stairs. Fix any carpet that is loose or worn. Avoid having throw rugs at the top or bottom of the stairs. If you do have throw rugs, attach them to the floor with carpet tape. Make sure that you have a light switch at the top of the stairs and the bottom of the stairs. If you do not have them, ask someone to add them for you. What else can I do to help prevent falls? Wear shoes that: Do not have high heels. Have rubber bottoms. Are comfortable and fit you well. Are closed at the toe. Do not wear sandals. If you use a stepladder: Make sure that it is fully opened. Do not climb a closed stepladder. Make sure that both sides of the stepladder  are locked into place. Ask someone to hold it for you, if possible. Clearly mark and make sure that you can see: Any grab bars or handrails. First and last steps. Where the edge of each step is. Use tools that help you move around (mobility aids) if they are needed. These include: Canes. Walkers. Scooters. Crutches. Turn on the lights when you go into a dark area. Replace any light bulbs as soon as they burn out. Set up your furniture so you have a clear path. Avoid moving your furniture around. If any of your floors are uneven, fix them. If there are any pets around you, be aware of where they are. Review your medicines with your doctor. Some medicines can make you feel dizzy. This can increase your chance of falling. Ask your doctor what other things that you can do to help prevent falls. This information is not intended to replace advice given to you by your health care provider. Make sure you discuss any questions you have with your health care provider. Document Released: 06/07/2009 Document Revised: 01/17/2016 Document Reviewed: 09/15/2014 Elsevier Interactive Patient Education  2017 [employer].

## 2022-05-15 ENCOUNTER — Other Ambulatory Visit: Payer: Self-pay | Admitting: Family Medicine

## 2022-09-03 ENCOUNTER — Other Ambulatory Visit: Payer: Self-pay | Admitting: Family Medicine

## 2022-09-04 ENCOUNTER — Other Ambulatory Visit: Payer: Self-pay | Admitting: Family Medicine

## 2022-09-23 ENCOUNTER — Other Ambulatory Visit (INDEPENDENT_AMBULATORY_CARE_PROVIDER_SITE_OTHER): Payer: Medicare PPO

## 2022-09-23 DIAGNOSIS — R739 Hyperglycemia, unspecified: Secondary | ICD-10-CM | POA: Diagnosis not present

## 2022-09-23 DIAGNOSIS — I10 Essential (primary) hypertension: Secondary | ICD-10-CM

## 2022-09-23 LAB — COMPREHENSIVE METABOLIC PANEL
ALT: 22 U/L (ref 0–53)
AST: 19 U/L (ref 0–37)
Albumin: 4.4 g/dL (ref 3.5–5.2)
Alkaline Phosphatase: 36 U/L — ABNORMAL LOW (ref 39–117)
BUN: 26 mg/dL — ABNORMAL HIGH (ref 6–23)
CO2: 29 mEq/L (ref 19–32)
Calcium: 9.2 mg/dL (ref 8.4–10.5)
Chloride: 104 mEq/L (ref 96–112)
Creatinine, Ser: 1.21 mg/dL (ref 0.40–1.50)
GFR: 58.73 mL/min — ABNORMAL LOW (ref >60.00)
Glucose, Bld: 101 mg/dL — ABNORMAL HIGH (ref 70–99)
Potassium: 4 mEq/L (ref 3.5–5.1)
Sodium: 140 mEq/L (ref 135–145)
Total Bilirubin: 0.6 mg/dL (ref 0.2–1.2)
Total Protein: 6.4 g/dL (ref 6.0–8.3)

## 2022-09-23 LAB — HEMOGLOBIN A1C: Hgb A1c MFr Bld: 5.9 % (ref 4.6–6.5)

## 2022-09-29 ENCOUNTER — Ambulatory Visit: Payer: Medicare PPO | Admitting: Family Medicine

## 2022-09-29 ENCOUNTER — Encounter: Payer: Self-pay | Admitting: Family Medicine

## 2022-09-29 VITALS — BP 100/70 | HR 64 | Temp 97.3°F | Ht 65.0 in | Wt 171.0 lb

## 2022-09-29 DIAGNOSIS — R739 Hyperglycemia, unspecified: Secondary | ICD-10-CM

## 2022-09-29 DIAGNOSIS — I10 Essential (primary) hypertension: Secondary | ICD-10-CM | POA: Diagnosis not present

## 2022-09-29 DIAGNOSIS — R351 Nocturia: Secondary | ICD-10-CM

## 2022-09-29 DIAGNOSIS — M10079 Idiopathic gout, unspecified ankle and foot: Secondary | ICD-10-CM

## 2022-09-29 DIAGNOSIS — E785 Hyperlipidemia, unspecified: Secondary | ICD-10-CM | POA: Diagnosis not present

## 2022-09-29 DIAGNOSIS — M5412 Radiculopathy, cervical region: Secondary | ICD-10-CM

## 2022-09-29 DIAGNOSIS — N401 Enlarged prostate with lower urinary tract symptoms: Secondary | ICD-10-CM

## 2022-09-29 DIAGNOSIS — N183 Chronic kidney disease, stage 3 unspecified: Secondary | ICD-10-CM

## 2022-09-29 NOTE — Patient Instructions (Addendum)
Glad you are doing well other than the tingling (suspect irritated nerve root)- lets try PT through your gym and if not imprving can refer to [place] but I do think you will improve . Let us know if worsening  Recommended follow up: Return in about 6 months (around 03/30/2023) for physical or sooner if needed.Schedule b4 you leave. Do labs 04/01/23 or later and then visit right after

## 2022-09-29 NOTE — Progress Notes (Signed)
Phone [phone] In person visit   Subjective:   Robert Gaines is a 77 y.o. year old very pleasant male patient who presents for/with See problem oriented charting Chief Complaint  Patient presents with   Follow-up    Pt wants to discuss recent lab work.   Hyperlipidemia   Hypertension   hand tingling    Pt c/o tingling in left hand and a few fingers in his right hand.    Past Medical History-  Patient Active Problem List   Diagnosis Date Noted   Hyperglycemia 10/03/2014    Priority: Medium    BPH associated with nocturia 10/03/2014    Priority: Medium    Gout 06/21/2009    Priority: Medium    Chronic kidney disease, stage III (moderate) ([place]) 04/05/2008    Priority: Medium    Hypertension 05/31/2007    Priority: Medium    Hyperlipidemia 05/24/2007    Priority: Medium    Seasonal allergies 05/11/2014    Priority: Low   PSA, INCREASED 04/16/2010    Priority: Low   DEPRESSION 05/24/2007    Priority: Low    Medications- reviewed and updated Current Outpatient Medications  Medication Sig Dispense Refill   allopurinol (ZYLOPRIM) 100 MG tablet TAKE 2 TABLETS BY MOUTH EVERY DAY 180 tablet 3   aspirin 81 MG tablet Take 81 mg by mouth daily.     atorvastatin (LIPITOR) 40 MG tablet TAKE 1 TABLET BY MOUTH EVERY DAY 90 tablet 3   Biotin 5000 MCG TABS Take 1 tablet by mouth.     Calcium Carbonate-Vitamin D (CALCIUM-VITAMIN D) 500-200 MG-UNIT tablet Take 1 tablet by mouth daily.     cyanocobalamin 1000 MCG tablet Take 1,000 mcg by mouth daily.     ferrous sulfate 325 (65 FE) MG tablet Take 325 mg by mouth daily with breakfast.     glucosamine-chondroitin 500-400 MG tablet Take 1 tablet by mouth 3 (three) times daily.     lisinopril (ZESTRIL) 5 MG tablet TAKE 1 TABLET BY MOUTH EVERY DAY 90 tablet 3   Multiple Vitamins-Minerals (CENTRUM SILVER PO) Take 1 tablet by mouth daily.      No current facility-administered medications for this visit.     Objective:  BP 100/70    Pulse 64   Temp (!) 97.3 F (36.3 C)   Ht 5\' 5"  (1.651 m)   Wt 171 lb (77.6 kg)   SpO2 97%   BMI 28.46 kg/m  Gen: NAD, resting comfortably CV: RRR no murmurs rubs or gallops Lungs: CTAB no crackles, wheeze, rhonchi Ext: no edema Skin: warm, dry Msk good grip strength and upper extremity strength, spurling on left produces tingling into the left hand and some into the right, no midline neck pain    Assessment and Plan   # overweight/ hyperglycemia/prediabetes-peak A1c at least 6.1 S:  down 3 lbs over holidays! Exercising and trying to eat well Lab Results  Component Value Date   HGBA1C 5.9 09/23/2022   HGBA1C 6.0 03/31/2022   HGBA1C 5.9 10/02/2021  A/P: for prediabetes/hyperglycemia- improving- congratulated his efforts For overweight- making good progress on weight- continue current medications  -hed like to work on 165 by next visit (was 170 on home scales today0  #hypertension/CKD III S: compliant with lisinopril 5mg    GFR has been stable in 50s. Ace I in case proteinuric element  BP Readings from Last 3 Encounters:  09/29/22 100/70  04/01/22 130/60  10/02/21 132/82  Just left the gym- [employer]- liking the  switch!  A/P: stable- continue current medicines   #hyperlipidemia S:  Compliant with atorvastatin 40mg   Lab Results  Component Value Date   CHOL 152 03/31/2022   HDL 47.20 03/31/2022   LDLCALC 79 03/31/2022   LDLDIRECT 96.0 01/20/2019   TRIG 126.0 03/31/2022   CHOLHDL 3 03/31/2022   A/P: close to ideal goal but we also have wanted to avoid increasing cholesterol medicine due to prediabetes risk with statins  #Gout S:  flares in last  on allopurinol 200mg  per day A/P: no recent issues/controlled- continue current medications   # Tingling in hands L >R  S:noted in left hand and a few fingers in right hand.  For about a month. On left hand includes all 5 fingers, on right usually finger 2 and 3. Putting in eye drops It triggers some. Not dizzy but feels  the tingling. With hands up and head tilted back can note it radiating up to and past the elbow. Not really much pain. He is trying alpha lipoic acid. No fall or injury A/P: paresthesias in left hand and up to elbow with negative tinel and phalen and no midline neck pain. Does note with tilting head back at times and positive spurling today.   - wants to hold off on prednisone - discussed possible PT- he may try with [employer] [facility] Charlton Amor PT DPT CPT 0r [facility] would be an option -could refer to orthopedics Raliegh Ip  #Shingrix shot- holding off for now. Declines other shots  Recommended follow up: Return in about 6 months (around 03/30/2023) for physical or sooner if needed.Schedule b4 you leave. Future Appointments  Date Time Provider [facility]  04/10/2023  9:30 AM [facility]   Lab/Order associations:   ICD-10-CM   1. Left cervical radiculopathy  M54.12 Ambulatory referral to Physical Therapy    2. Stage 3 chronic kidney disease, unspecified whether stage 3a or 3b CKD (H[facility])  N18.30     3. Idiopathic gout of foot, unspecified chronicity, unspecified laterality  M10.079 Uric acid    4. Hyperglycemia  R73.9 Hemoglobin A1c    5. Hyperlipidemia, unspecified hyperlipidemia type  E78.5 CBC with Differential/Platelet    Comprehensive metabolic panel    Lipid panel    6. Primary hypertension  I10     7. BPH associated with nocturia  N40.1 PSA   R35.1       No orders of the defined types were placed in this encounter.   Return precautions advised.  Garret Reddish, MD

## 2022-10-31 DIAGNOSIS — M5412 Radiculopathy, cervical region: Secondary | ICD-10-CM | POA: Diagnosis not present

## 2022-12-01 DIAGNOSIS — M5412 Radiculopathy, cervical region: Secondary | ICD-10-CM | POA: Diagnosis not present

## 2022-12-22 ENCOUNTER — Other Ambulatory Visit: Payer: Self-pay

## 2022-12-22 ENCOUNTER — Ambulatory Visit: Payer: Medicare PPO | Attending: Orthopedic Surgery

## 2022-12-22 DIAGNOSIS — R293 Abnormal posture: Secondary | ICD-10-CM

## 2022-12-22 DIAGNOSIS — R29898 Other symptoms and signs involving the musculoskeletal system: Secondary | ICD-10-CM | POA: Diagnosis not present

## 2022-12-22 DIAGNOSIS — M5412 Radiculopathy, cervical region: Secondary | ICD-10-CM | POA: Insufficient documentation

## 2022-12-22 NOTE — Therapy (Signed)
OUTPATIENT PHYSICAL THERAPY CERVICAL EVALUATION   Patient Name: Robert Gaines MRN: [id number] DOB:1945/11/03, 77 y.o., male Today's Date: 12/22/2022  END OF SESSION:  PT End of Session - 12/22/22 0932     Visit Number 1    Date for PT Re-Evaluation 03/16/23    PT Start Time 0932    PT Stop Time 1015    PT Time Calculation (min) 43 min    Activity Tolerance Patient tolerated treatment well    Behavior During Therapy [facility] for tasks assessed/performed             Past Medical History:  Diagnosis Date   BPH associated with nocturia 10/03/2014   Depression    Hyperlipidemia    Hypertension    PSA, INCREASED 04/16/2010   History prostatitis x 2, # decreased with ciprofloxacin     Rosacea    Past Surgical History:  Procedure Laterality Date   FLEXIBLE SIGMOIDOSCOPY     TONSILLECTOMY     at around 76-10 yrs old   WISDOM TOOTH EXTRACTION     Patient Active Problem List   Diagnosis Date Noted   Hyperglycemia 10/03/2014   BPH associated with nocturia 10/03/2014   Seasonal allergies 05/11/2014   PSA, INCREASED 04/16/2010   Gout 06/21/2009   Chronic kidney disease, stage III (moderate) ([facility]) 04/05/2008   Hypertension 05/31/2007   Hyperlipidemia 05/24/2007   DEPRESSION 05/24/2007    PCP: Tana Conch, MD  REFERRING PROVIDER: Teryl Lucy, MD  REFERRING DIAG: cervical radiculopathy  THERAPY DIAG:  Radiculopathy, cervical region  Abnormal posture  Decreased grip strength  Rationale for Evaluation and Treatment: Rehabilitation  ONSET DATE: 09/23/22  SUBJECTIVE:                                                                                                                                                                                                         SUBJECTIVE STATEMENT: Have been getting up several times a night to lift my 55# collie down the steps so that he can go outside.  His legs are giving out. Constant tingling B hand,s greater on L, worse with  specific movements such as putting drops in his eyes Hand dominance: Right  PERTINENT HISTORY:  2-3 month history of B stocking/ glove like numbness B hands, and runs lateral/post elbow , notices worse Sx with extending neck to put in eye drops Worse on L hand. Family doctor referred to orthopedist, orthopedist did x rays, and 2 shots, shots did not make much difference.  Spends 1 1/2 hrs at gym 3 x week,  has been going to gym for 10 to 15 years.  PAIN:  Are you having pain? No  PRECAUTIONS: None  WEIGHT BEARING RESTRICTIONS: No  FALLS:  Has patient fallen in last 6 months? 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, lifts the dog several times a week, married   PLOF: Independent  PATIENT GOALS: be able to get numbness to go away B hands  NEXT MD VISIT: unknown  OBJECTIVE:   DIAGNOSTIC FINDINGS:  Not in epic, patient reports orthopedist performed x rays c spine without remarkable findings  PATIENT SURVEYS:  NDI 5/50 or 10% disability  COGNITION: Overall cognitive status: Within functional limits for tasks assessed  SENSATION: Light touch: Impaired   POSTURE: rounded shoulders, forward head, and increased thoracic kyphosis  PALPATION: Tender B upper traps, L greater than R, also  L pec minor, coracobrachialis, pec major, ant scalenes.  Non tender B middle traps, levator Cervical side glides wnl Upper thoracic spine p/a mobility mild restriction   CERVICAL ROM:   Active ROM A/PROM (deg) eval  Flexion wfl  Extension Wfl, L lat tilt noted  Right lateral flexion 30  Left lateral flexion 30  Right rotation 80  Left rotation 80   (Blank rows = not tested)  UPPER EXTREMITY ROM: All B UE ROM wfl   UPPER EXTREMITY MMT:All MMT wfl , ex triceps L 4/5 , ER L 4/5 , intrinsics abd B  hands 4-/5, grasp B 4/5   CERVICAL SPECIAL TESTS:  Upper limb tension test (ULTT): Negative   TODAY'S TREATMENT:                                                                                                                              DATE: 12/22/22:   PATIENT EDUCATION:  Education details: POC, goals Person educated: Patient Education method: Programmer, multimedia, Demonstration, Tactile cues, and Verbal cues Education comprehension: verbalized understanding, verbal cues required, tactile cues required, and needs further education  HOME EXERCISE PROGRAM: Initiated pecs stretch  ASSESSMENT:  CLINICAL IMPRESSION: Patient is a 77 y.o. male who was seen today for physical therapy evaluation and treatment for cervical radiculopathy.  He is very fit, active, works out regularly.  Has 3 months h/o B hand numbness tingling.  Testing today revealed weakness B grip strength, B hand intrinsics, and L triceps, L shoulder ER. Tender/irritable B pectorals, upper traps, anterior neck and chest musculature. Sx may be partially due to repetitive lifting of his ill/ elderly, 55# dog, several times at night and throughout the day.  Will benefit from skilled PT to address his Sx.  Would most likely benefit from dry needling, manual techniques to stretch his pecs and scalenes, also needs specific rotator cuff strengthening.  May also benefit from taping for postural support upper thoracic spine.  OBJECTIVE IMPAIRMENTS: decreased strength, increased fascial restrictions, increased muscle spasms, impaired sensation, impaired UE functional use, and postural dysfunction.   ACTIVITY LIMITATIONS: lifting and reach over head  PARTICIPATION LIMITATIONS: community activity and yard work  PERSONAL FACTORS: Behavior pattern, Fitness, Past/current experiences, and Time since onset of injury/illness/exacerbation are also affecting patient's functional outcome.   REHAB POTENTIAL: Good  CLINICAL DECISION MAKING:  Stable/uncomplicated  EVALUATION COMPLEXITY: Low   GOALS: Goals reviewed with patient? Yes  SHORT TERM GOALS: Target date: 2 weeks 01/05/23  I HEP for management of Sx Baseline:  Goal status: INITIAL   LONG TERM GOALS: Target date: 03/16/23  NDI improve from 10% to 5 % disability Baseline:  Goal status: INITIAL  2.  Increase strength L triceps, L shoulder ER to 5/5 Baseline: 4/5 Goal status: INITIAL  3.  Improve parasthesia B hands by 50% with global improvement rating Baseline: constant Goal status: INITIAL    PLAN:  PT FREQUENCY: 1-2x/week  PT DURATION: 12 weeks  PLANNED INTERVENTIONS: Therapeutic exercises, Therapeutic activity, Neuromuscular re-education, Balance training, Gait training, Patient/Family education, Self Care, and Joint mobilization  PLAN FOR NEXT SESSION: utilize dry needling, stretching of pecs, upper traps, levator, strengthen ER's shoulders, needs assessment of cervical distraction to determine whether cervical traction may help   Shree Espey L Leeba Barbe, PT 12/22/2022, 3:42 PM

## 2022-12-30 ENCOUNTER — Ambulatory Visit: Payer: Medicare PPO | Attending: Orthopedic Surgery | Admitting: Physical Therapy

## 2022-12-30 DIAGNOSIS — R29898 Other symptoms and signs involving the musculoskeletal system: Secondary | ICD-10-CM | POA: Insufficient documentation

## 2022-12-30 DIAGNOSIS — M5412 Radiculopathy, cervical region: Secondary | ICD-10-CM | POA: Insufficient documentation

## 2022-12-30 DIAGNOSIS — R293 Abnormal posture: Secondary | ICD-10-CM

## 2022-12-30 NOTE — Therapy (Signed)
OUTPATIENT PHYSICAL THERAPY CERVICAL    Patient Name: Robert Gaines MRN: [id number] DOB:08/21/1946, 77 y.o., male Today's Date: 12/30/2022  END OF SESSION:  PT End of Session - 12/30/22 0828     Visit Number 2    Date for PT Re-Evaluation 03/16/23    PT Start Time 0830    PT Stop Time 0930    PT Time Calculation (min) 60 min             Past Medical History:  Diagnosis Date   BPH associated with nocturia 10/03/2014   Depression    Hyperlipidemia    Hypertension    PSA, INCREASED 04/16/2010   History prostatitis x 2, # decreased with ciprofloxacin     Rosacea    Past Surgical History:  Procedure Laterality Date   FLEXIBLE SIGMOIDOSCOPY     TONSILLECTOMY     at around 109-9 yrs old   WISDOM TOOTH EXTRACTION     Patient Active Problem List   Diagnosis Date Noted   Hyperglycemia 10/03/2014   BPH associated with nocturia 10/03/2014   Seasonal allergies 05/11/2014   PSA, INCREASED 04/16/2010   Gout 06/21/2009   Chronic kidney disease, stage III (moderate) (HCC) 04/05/2008   Hypertension 05/31/2007   Hyperlipidemia 05/24/2007   DEPRESSION 05/24/2007    PCP: Tana Conch, MD  REFERRING PROVIDER: Teryl Lucy, MD  REFERRING DIAG: cervical radiculopathy  THERAPY DIAG:  Radiculopathy, cervical region  Abnormal posture  Rationale for Evaluation and Treatment: Rehabilitation  ONSET DATE: 09/23/22  SUBJECTIVE:                                                                                                                                                                                                         SUBJECTIVE STATEMENT: At the gym and doing some extra stretching. Also got  neck and arm massage. Overall some better I think Hand dominance: Right  PERTINENT HISTORY:  2-3 month history of B stocking/ glove like numbness B hands, and runs lateral/post elbow , notices worse Sx with extending neck to put in eye drops Worse on L hand. Family doctor referred  to orthopedist, orthopedist did x rays, and 2 shots, shots did not make much difference.  Spends 1 1/2 hrs at gym 3 x week, has been going to gym for 10 to 15 years.  PAIN:  Are you having pain? No  PRECAUTIONS: None  WEIGHT BEARING RESTRICTIONS: No  FALLS:  Has patient fallen in last 6 months? 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, lifts the dog several times a week, married   PLOF: Independent  PATIENT GOALS: be able to get numbness to go away B hands  NEXT MD VISIT: unknown  OBJECTIVE:   DIAGNOSTIC FINDINGS:  Not in epic, patient reports orthopedist performed x rays c spine without remarkable findings  PATIENT SURVEYS:  NDI 5/50 or 10% disability  COGNITION: Overall cognitive status: Within functional limits for tasks assessed  SENSATION: Light touch: Impaired   POSTURE: rounded shoulders, forward head, and increased thoracic kyphosis  PALPATION: Tender B upper traps, L greater than R, also  L pec minor, coracobrachialis, pec major, ant scalenes.  Non tender B middle traps, levator Cervical side glides wnl Upper thoracic spine p/a mobility mild restriction   CERVICAL ROM:   Active ROM A/PROM (deg) eval  Flexion wfl  Extension Wfl, L lat tilt noted  Right lateral flexion 30  Left lateral flexion 30  Right rotation 80  Left rotation 80   (Blank rows = not tested)  UPPER EXTREMITY ROM: All B UE ROM wfl   UPPER EXTREMITY MMT:All MMT wfl , ex triceps L 4/5 , ER L 4/5 , intrinsics abd B hands 4-/5, grasp B 4/5   CERVICAL SPECIAL TESTS:  Upper limb tension test (ULTT): Negative   TODAY'S TREATMENT:                                                                                                                              DATE:   12/30/22 UBE L 5 2 min each  way Seated row and Lat Pull Down 2 sets 12  45#- educ to do at gym and cued to increase posture Flys and Chest press - again educ for gym and correct tech HEP issued and performed- with postural cuing needed as he tends to engage trap Reviewed Pec stretching as he doing more of lat stretch  DN performed by Octavio Graves PT cerv/trap area  Mech cerv traction 12 min   12/22/22:   PATIENT EDUCATION:  Education details: POC, goals Person educated: Patient Education method: Explanation, Demonstration, Tactile cues, and Verbal cues Education comprehension: verbalized understanding, verbal cues required, tactile cues required, and needs further education  HOME EXERCISE PROGRAM:  12/30/22 Access Code: [email or web address] URL: [email or web address] Date: 12/30/2022 Prepared by: Marylene Land Afsa Meany  Exercises - Standing Shoulder Row with Anchored Resistance  - 1 x daily - 7 x weekly - 1 sets - 15 reps - Shoulder extension with resistance - Neutral  - 1 x daily - 7 x weekly - 1 sets - 15 reps - Standing Row with Resistance with Anchored Resistance at Chest Height Palms Down  - 1 x daily - 7 x weekly - 1 sets - 15 reps - Shoulder External Rotation and Scapular Retraction with Resistance  - 1 x daily - 7 x weekly -  1 sets - 15 reps - Standing Isometric Cervical Retraction with Chin Tucks and Ball at [employer]  - 1 x daily - 7 x weekly - 2-3 sets - 10 reps - 3 hold  ASSESSMENT:  CLINICAL IMPRESSION: Focus session on ex to do in gym and added and HEP.  Postural cuing needed! Postural Education. STG progressing OBJECTIVE IMPAIRMENTS: decreased strength, increased fascial restrictions, increased muscle spasms, impaired sensation, impaired UE functional use, and postural dysfunction.   ACTIVITY LIMITATIONS: lifting and reach over head  PARTICIPATION LIMITATIONS: community activity and yard work  PERSONAL FACTORS: Behavior pattern, Fitness, Past/current experiences, and Time since onset of  injury/illness/exacerbation are also affecting patient's functional outcome.   REHAB POTENTIAL: Good  CLINICAL DECISION MAKING: Stable/uncomplicated  EVALUATION COMPLEXITY: Low   GOALS: Goals reviewed with patient? Yes  SHORT TERM GOALS: Target date: 2 weeks 01/05/23  I HEP for management of Sx Baseline:  Goal status: progressing 12/30/22   LONG TERM GOALS: Target date: 03/16/23  NDI improve from 10% to 5 % disability Baseline:  Goal status: INITIAL  2.  Increase strength L triceps, L shoulder ER to 5/5 Baseline: 4/5 Goal status: INITIAL  3.  Improve parasthesia B hands by 50% with global improvement rating Baseline: constant Goal status: INITIAL    PLAN:  PT FREQUENCY: 1-2x/week  PT DURATION: 12 weeks  PLANNED INTERVENTIONS: Therapeutic exercises, Therapeutic activity, Neuromuscular re-education, Balance training, Gait training, Patient/Family education, Self Care, and Joint mobilization  PLAN FOR NEXT SESSION: utilize dry needling, stretching of pecs, upper traps, levator, strengthen ER's shoulders, needs assessment of cervical distraction to determine whether cervical traction may help   Kazuma Elena,ANGIE, PTA 12/30/2022, 9:16 AM     [facility] at [facility] [address] [facility] [place], [place], [address] Phone: [phone]   Fax:  [phone]  Patient Details  Name: Robert Gaines MRN: [id number] Date of Birth: [birth year] Referring Provider:  Shelva Majestic, MD  Encounter Date: 12/30/2022   Suanne Marker, PTA 12/30/2022, 9:16 AM  [facility] at [facility] [address] [place], [place], [address] Phone: [phone]   Fax:  [phone]

## 2023-01-01 ENCOUNTER — Ambulatory Visit: Payer: Medicare PPO | Admitting: Physical Therapy

## 2023-01-01 DIAGNOSIS — R29898 Other symptoms and signs involving the musculoskeletal system: Secondary | ICD-10-CM | POA: Diagnosis not present

## 2023-01-01 DIAGNOSIS — R293 Abnormal posture: Secondary | ICD-10-CM | POA: Diagnosis not present

## 2023-01-01 DIAGNOSIS — M5412 Radiculopathy, cervical region: Secondary | ICD-10-CM | POA: Diagnosis not present

## 2023-01-01 NOTE — Therapy (Signed)
OUTPATIENT PHYSICAL THERAPY CERVICAL    Patient Name: Robert Gaines MRN: [id number] DOB:[birth year], 77 y.o., male Today's Date: 01/01/2023  END OF SESSION:  PT End of Session - 01/01/23 0915     Visit Number 3    Date for PT Re-Evaluation 03/16/23    PT Start Time 0920    PT Stop Time 1010    PT Time Calculation (min) 50 min             Past Medical History:  Diagnosis Date   BPH associated with nocturia 10/03/2014   Depression    Hyperlipidemia    Hypertension    PSA, INCREASED 04/16/2010   History prostatitis x 2, # decreased with ciprofloxacin     Rosacea    Past Surgical History:  Procedure Laterality Date   FLEXIBLE SIGMOIDOSCOPY     TONSILLECTOMY     at around 58-9 yrs old   WISDOM TOOTH EXTRACTION     Patient Active Problem List   Diagnosis Date Noted   Hyperglycemia 10/03/2014   BPH associated with nocturia 10/03/2014   Seasonal allergies 05/11/2014   PSA, INCREASED 04/16/2010   Gout 06/21/2009   Chronic kidney disease, stage III (moderate) (HCC) 04/05/2008   Hypertension 05/31/2007   Hyperlipidemia 05/24/2007   DEPRESSION 05/24/2007    PCP: Tana Conch, MD  REFERRING PROVIDER: Teryl Lucy, MD  REFERRING DIAG: cervical radiculopathy  THERAPY DIAG:  Radiculopathy, cervical region  Abnormal posture  Rationale for Evaluation and Treatment: Rehabilitation  ONSET DATE: 09/23/22  SUBJECTIVE:                                                                                                                                                                                                         SUBJECTIVE STATEMENT:  things kinda opened up after last session esp with DN. Incorporating in gym ex. Turned corner for sure, some tingling.    PERTINENT HISTORY:  2-3 month history of B stocking/ glove like numbness B hands, and runs lateral/post elbow , notices worse Sx with extending neck to put in eye drops Worse on L hand. Family doctor referred to  orthopedist, orthopedist did x rays, and 2 shots, shots did not make much difference.  Spends 1 1/2 hrs at gym 3 x week, has been going to gym for 10 to 15 years.  PAIN:  Are you having pain? No  PRECAUTIONS: None  WEIGHT BEARING RESTRICTIONS: No  FALLS:  Has patient fallen in last 6 months? 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, lifts the dog several times a week, married   PLOF: Independent  PATIENT GOALS: be able to get numbness to go away B hands  NEXT MD VISIT: unknown  OBJECTIVE:   DIAGNOSTIC FINDINGS:  Not in epic, patient reports orthopedist performed x rays c spine without remarkable findings  PATIENT SURVEYS:  NDI 5/50 or 10% disability  COGNITION: Overall cognitive status: Within functional limits for tasks assessed  SENSATION: Light touch: Impaired   POSTURE: rounded shoulders, forward head, and increased thoracic kyphosis  PALPATION: Tender B upper traps, L greater than R, also  L pec minor, coracobrachialis, pec major, ant scalenes.  Non tender B middle traps, levator Cervical side glides wnl Upper thoracic spine p/a mobility mild restriction   CERVICAL ROM:   Active ROM A/PROM (deg) eval  Flexion wfl  Extension Wfl, L lat tilt noted  Right lateral flexion 30  Left lateral flexion 30  Right rotation 80  Left rotation 80   (Blank rows = not tested)  UPPER EXTREMITY ROM: All B UE ROM wfl   UPPER EXTREMITY MMT:All MMT wfl , ex triceps L 4/5 , ER L 4/5 , intrinsics abd B hands 4-/5, grasp B 4/5   CERVICAL SPECIAL TESTS:  Upper limb tension test (ULTT): Negative   TODAY'S TREATMENT:                                                                                                                              DATE:   01/01/23 UBE 3 min fwd and back Cable  Pulley shld ext 10# 2 sets 10 Cable Pulley shld row 15# 2 sets 10 Seated row,shld ext and horz abd 2 sets 10 6 #  3# 4 pt rhy stab 10 x each Head on ball on wall with retraction and 3# UE exercises ( shld flex,chest press, upright row and abd) Supine horz abd 10 x then diagnols 10 x each PROM and stretching cspine in supine with manual cerv traction  DN performed by Octavio Graves PT cerv/trap area    12/30/22 UBE L 5 2 min each way Seated row and Lat Pull Down 2 sets 12  45#- educ to do at gym and cued to increase posture Flys and Chest press - again educ for gym and correct tech HEP issued and performed- with postural cuing needed as he tends to engage trap Reviewed Pec stretching as he doing more of lat stretch  DN performed by Octavio Graves PT cerv/trap area  Mech cerv traction 12 min   12/22/22:   PATIENT EDUCATION:  Education details: POC, goals Person educated: Patient Education method: Explanation, Demonstration, Tactile cues, and Verbal cues Education comprehension: verbalized understanding, verbal cues required, tactile cues required, and needs further education  HOME EXERCISE PROGRAM:  12/30/22 Access Code: [email or web address] URL: [email or web address] Date: 12/30/2022 Prepared by:  Marylene Land Mohan Erven  Exercises - Standing Shoulder Row with Anchored Resistance  - 1 x daily - 7 x weekly - 1 sets - 15 reps - Shoulder extension with resistance - Neutral  - 1 x daily - 7 x weekly - 1 sets - 15 reps - Standing Row with Resistance with Anchored Resistance at Chest Height Palms Down  - 1 x daily - 7 x weekly - 1 sets - 15 reps - Shoulder External Rotation and Scapular Retraction with Resistance  - 1 x daily - 7 x weekly - 1 sets - 15 reps - Standing Isometric Cervical Retraction with Chin Tucks and Ball at [employer]  - 1 x daily - 7 x weekly - 2-3 sets - 10 reps - 3 hold  ASSESSMENT:  CLINICAL IMPRESSION: pt arrived feeling like he turned a corner, educ on ex and DN helpful.  Continue to progress ex and educ on postural cuing esp to not engage traps OBJECTIVE IMPAIRMENTS: decreased strength, increased fascial restrictions, increased muscle spasms, impaired sensation, impaired UE functional use, and postural dysfunction.   ACTIVITY LIMITATIONS: lifting and reach over head  PARTICIPATION LIMITATIONS: community activity and yard work  PERSONAL FACTORS: Behavior pattern, Fitness, Past/current experiences, and Time since onset of injury/illness/exacerbation are also affecting patient's functional outcome.   REHAB POTENTIAL: Good  CLINICAL DECISION MAKING: Stable/uncomplicated  EVALUATION COMPLEXITY: Low   GOALS: Goals reviewed with patient? Yes  SHORT TERM GOALS: Target date: 2 weeks 01/05/23  I HEP for management of Sx Baseline:  Goal status: progressing 12/30/22   LONG TERM GOALS: Target date: 03/16/23  NDI improve from 10% to 5 % disability Baseline:  Goal status: INITIAL  2.  Increase strength L triceps, L shoulder ER to 5/5 Baseline: 4/5 Goal status: INITIAL  3.  Improve parasthesia B hands by 50% with global improvement rating Baseline: constant Goal status: INITIAL    PLAN:  PT FREQUENCY: 1-2x/week  PT DURATION: 12 weeks  PLANNED INTERVENTIONS: Therapeutic exercises, Therapeutic activity, Neuromuscular re-education, Balance training, Gait training, Patient/Family education, Self Care, and Joint mobilization  PLAN FOR NEXT SESSION: utilize dry needling, stretching of pecs, upper traps, levator, strengthen ER's shoulders, needs assessment of cervical distraction to determine whether cervical traction may help   Landy Dunnavant,ANGIE, PTA 01/01/2023, 9:16 AM     [facility] at [facility] [address] [facility] [place], [place], [address] Phone: [phone]   Fax:  [phone]  Patient Details  Name: DIVYANSH LYMON MRN: [id number] Date of Birth: 11-Mar-1946 Referring Provider:  Shelva Majestic,  MD  Encounter Date: 01/01/2023

## 2023-01-05 ENCOUNTER — Ambulatory Visit: Payer: Medicare PPO

## 2023-01-05 DIAGNOSIS — M5412 Radiculopathy, cervical region: Secondary | ICD-10-CM | POA: Diagnosis not present

## 2023-01-05 DIAGNOSIS — R293 Abnormal posture: Secondary | ICD-10-CM

## 2023-01-05 DIAGNOSIS — R29898 Other symptoms and signs involving the musculoskeletal system: Secondary | ICD-10-CM

## 2023-01-05 NOTE — Therapy (Signed)
OUTPATIENT PHYSICAL THERAPY CERVICAL    Patient Name: Robert Gaines MRN: [id number] DOB:[birth year], 77 y.o., male Today's Date: 01/05/2023  END OF SESSION:  PT End of Session - 01/05/23 1107     Visit Number 4    Date for PT Re-Evaluation 03/16/23    PT Start Time 1100    PT Stop Time 1145    PT Time Calculation (min) 45 min    Activity Tolerance Patient tolerated treatment well    Behavior During Therapy [facility] for tasks assessed/performed             Past Medical History:  Diagnosis Date   BPH associated with nocturia 10/03/2014   Depression    Hyperlipidemia    Hypertension    PSA, INCREASED 04/16/2010   History prostatitis x 2, # decreased with ciprofloxacin     Rosacea    Past Surgical History:  Procedure Laterality Date   FLEXIBLE SIGMOIDOSCOPY     TONSILLECTOMY     at around 77-9 yrs old   WISDOM TOOTH EXTRACTION     Patient Active Problem List   Diagnosis Date Noted   Hyperglycemia 10/03/2014   BPH associated with nocturia 10/03/2014   Seasonal allergies 05/11/2014   PSA, INCREASED 04/16/2010   Gout 06/21/2009   Chronic kidney disease, stage III (moderate) (HCC) 04/05/2008   Hypertension 05/31/2007   Hyperlipidemia 05/24/2007   DEPRESSION 05/24/2007    PCP: Tana Conch, MD  REFERRING PROVIDER: Teryl Lucy, MD  REFERRING DIAG: cervical radiculopathy  THERAPY DIAG:  Radiculopathy, cervical region  Abnormal posture  Decreased grip strength  Rationale for Evaluation and Treatment: Rehabilitation  ONSET DATE: 09/23/22  SUBJECTIVE:                                                                                                                                                                                                         SUBJECTIVE STATEMENT:  overall better, still tingling B Ue's, stil lifting dog several times a day and night.   PERTINENT HISTORY:  2-3 month history of B stocking/ glove like numbness B hands, and runs lateral/post  elbow , notices worse Sx with extending neck to put in eye drops Worse on L hand. Family doctor referred to orthopedist, orthopedist did x rays, and 2 shots, shots did not make much difference.  Spends 1 1/2 hrs at gym 3 x week, has been going to gym for 10 to 15 years.  PAIN:  Are you having pain? No  PRECAUTIONS: None  WEIGHT BEARING RESTRICTIONS: No  FALLS:  Has patient fallen  in last 6 months? 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, lifts the dog several times a week, married   PLOF: Independent  PATIENT GOALS: be able to get numbness to go away B hands  NEXT MD VISIT: unknown  OBJECTIVE:   DIAGNOSTIC FINDINGS:  Not in epic, patient reports orthopedist performed x rays c spine without remarkable findings  PATIENT SURVEYS:  NDI 5/50 or 10% disability  COGNITION: Overall cognitive status: Within functional limits for tasks assessed  SENSATION: Light touch: Impaired   POSTURE: rounded shoulders, forward head, and increased thoracic kyphosis  PALPATION: Tender B upper traps, L greater than R, also  L pec minor, coracobrachialis, pec major, ant scalenes.  Non tender B middle traps, levator Cervical side glides wnl Upper thoracic spine p/a mobility mild restriction   CERVICAL ROM:   Active ROM A/PROM (deg) eval  Flexion wfl  Extension Wfl, L lat tilt noted  Right lateral flexion 30  Left lateral flexion 30  Right rotation 80  Left rotation 80   (Blank rows = not tested)  UPPER EXTREMITY ROM: All B UE ROM wfl   UPPER EXTREMITY MMT:All MMT wfl , ex triceps L 4/5 , ER L 4/5 , intrinsics abd B hands 4-/5, grasp B 4/5   CERVICAL SPECIAL TESTS:  Upper limb tension test (ULTT): Negative   TODAY'S TREATMENT:                                                                                                                               DATE:  01/05/23: Manual:  Trigger Point Dry-Needling  Treatment instructions: Expect mild to moderate muscle soreness. S/S of pneumothorax if dry needled over a lung field, and to seek immediate medical attention should they occur. Patient verbalized understanding of these instructions and education. Patient Consent Given: Yes Education handout provided: Previously provided Muscles treated: B upper traps, B pec major Electrical stimulation performed: No Parameters: N/A Treatment response/outcome: Twitch Response Elicited and Palpable Increase in Muscle Length  Supine for manual stretching alternating bouts of manual cervical distraction, upper traps, levator scapulae stretching, retraction at cervicothoracic jxn, also with intermittent bouts of contract/relax to achieve better muscular lengthening.   Kinesiotaping, 2 I pieces, from thoracic spinous processes to medial border scapulae, applied with patient in rounded position  Therex: standing pulley stack, PNF pattern shoulder flex, add, Er to ext, abd, IR, 10 # each, 15 reps, cues to retract and depress scapula  Standing pulley stack pec flys, 10 # each, 2 sets 15 reps, cues to depress shoulders.  01/01/23 UBE 3 min fwd and back Cable Pulley shld ext 10# 2 sets 10 Cable Pulley shld row 15# 2 sets 10 Seated row,shld ext and horz abd 2 sets 10 6 #  3# 4 pt rhy stab  10 x each Head on ball on wall with retraction and 3# UE exercises ( shld flex,chest press, upright row and abd) Supine horz abd 10 x then diagnols 10 x each PROM and stretching cspine in supine with manual cerv traction  DN performed by Octavio Graves PT cerv/trap area    12/30/22 UBE L 5 2 min each way Seated row and Lat Pull Down 2 sets 12  45#- educ to do at gym and cued to increase posture Flys and Chest press - again educ for gym and correct tech HEP issued and performed- with postural cuing needed as he tends to  engage trap Reviewed Pec stretching as he doing more of lat stretch  DN performed by Octavio Graves PT cerv/trap area  Mech cerv traction 12 min   12/22/22:   PATIENT EDUCATION:  Education details: POC, goals Person educated: Patient Education method: Explanation, Demonstration, Tactile cues, and Verbal cues Education comprehension: verbalized understanding, verbal cues required, tactile cues required, and needs further education  HOME EXERCISE PROGRAM:  12/30/22 Access Code: [email or web address] URL: [email or web address] Date: 12/30/2022 Prepared by: Marylene Land Payseur  Exercises - Standing Shoulder Row with Anchored Resistance  - 1 x daily - 7 x weekly - 1 sets - 15 reps - Shoulder extension with resistance - Neutral  - 1 x daily - 7 x weekly - 1 sets - 15 reps - Standing Row with Resistance with Anchored Resistance at Chest Height Palms Down  - 1 x daily - 7 x weekly - 1 sets - 15 reps - Shoulder External Rotation and Scapular Retraction with Resistance  - 1 x daily - 7 x weekly - 1 sets - 15 reps - Standing Isometric Cervical Retraction with Chin Tucks and Ball at [employer]  - 1 x daily - 7 x weekly - 2-3 sets - 10 reps - 3 hold  ASSESSMENT:  CLINICAL IMPRESSION: reduced tingling B Ue's from 8 at initial eval to 5. Reports adapting some of his therex at gym to avoid shrugging shoulders too much.  Added pectoral dry needling ,and continued with postural retraining.  Continues to be a good candidate for skilled PT to address his B cervical radiculopathy  OBJECTIVE IMPAIRMENTS: decreased strength, increased fascial restrictions, increased muscle spasms, impaired sensation, impaired UE functional use, and postural dysfunction.   ACTIVITY LIMITATIONS: lifting and reach over head  PARTICIPATION LIMITATIONS: community activity and yard work  PERSONAL FACTORS: Behavior pattern, Fitness, Past/current experiences, and Time since onset of injury/illness/exacerbation are also affecting patient's  functional outcome.   REHAB POTENTIAL: Good  CLINICAL DECISION MAKING: Stable/uncomplicated  EVALUATION COMPLEXITY: Low   GOALS: Goals reviewed with patient? Yes  SHORT TERM GOALS: Target date: 2 weeks 01/05/23  I HEP for management of Sx Baseline:  Goal status: progressing 12/30/22   LONG TERM GOALS: Target date: 03/16/23  NDI improve from 10% to 5 % disability Baseline:  Goal status: INITIAL  2.  Increase strength L triceps, L shoulder ER to 5/5 Baseline: 4/5 Goal status: INITIAL  3.  Improve parasthesia B hands by 50% with global improvement rating Baseline: constant Goal status: INITIAL    PLAN:  PT FREQUENCY: 1-2x/week  PT DURATION: 12 weeks  PLANNED INTERVENTIONS: Therapeutic exercises, Therapeutic activity, Neuromuscular re-education, Balance training, Gait training, Patient/Family education, Self Care, and Joint mobilization  PLAN FOR NEXT SESSION: utilize dry needling, stretching of pecs, upper traps, levator, strengthen ER's shoulders.how was the kinesiotaping   Wenzel Backlund L Trentyn Boisclair, PT 01/05/2023, 3:22 PM     [facility] [address] [place], [place], [address] Phone: [phone]   Fax:  [phone]  Patient Details  Name: Robert Gaines MRN: [id number] Date of Birth: Jan 14, 1946 Referring Provider:  Shelva Majestic, MD  Encounter Date: 01/05/2023

## 2023-01-07 ENCOUNTER — Ambulatory Visit: Payer: Medicare PPO

## 2023-01-07 ENCOUNTER — Other Ambulatory Visit: Payer: Self-pay

## 2023-01-07 DIAGNOSIS — R293 Abnormal posture: Secondary | ICD-10-CM | POA: Diagnosis not present

## 2023-01-07 DIAGNOSIS — R29898 Other symptoms and signs involving the musculoskeletal system: Secondary | ICD-10-CM

## 2023-01-07 DIAGNOSIS — M5412 Radiculopathy, cervical region: Secondary | ICD-10-CM | POA: Diagnosis not present

## 2023-01-07 NOTE — Therapy (Signed)
OUTPATIENT PHYSICAL THERAPY CERVICAL    Patient Name: Robert Gaines MRN: [id number] DOB:06/02/1946, 77 y.o., male Today's Date: 01/07/2023  END OF SESSION:  PT End of Session - 01/07/23 1017     Visit Number 5    Date for PT Re-Evaluation 03/16/23    PT Start Time 1100    PT Stop Time 1145    PT Time Calculation (min) 45 min    Activity Tolerance Patient tolerated treatment well    Behavior During Therapy [facility] for tasks assessed/performed             Past Medical History:  Diagnosis Date   BPH associated with nocturia 10/03/2014   Depression    Hyperlipidemia    Hypertension    PSA, INCREASED 04/16/2010   History prostatitis x 2, # decreased with ciprofloxacin     Rosacea    Past Surgical History:  Procedure Laterality Date   FLEXIBLE SIGMOIDOSCOPY     TONSILLECTOMY     at around 11-9 yrs old   WISDOM TOOTH EXTRACTION     Patient Active Problem List   Diagnosis Date Noted   Hyperglycemia 10/03/2014   BPH associated with nocturia 10/03/2014   Seasonal allergies 05/11/2014   PSA, INCREASED 04/16/2010   Gout 06/21/2009   Chronic kidney disease, stage III (moderate) (HCC) 04/05/2008   Hypertension 05/31/2007   Hyperlipidemia 05/24/2007   DEPRESSION 05/24/2007    PCP: Tana Conch, MD  REFERRING PROVIDER: Teryl Lucy, MD  REFERRING DIAG: cervical radiculopathy  THERAPY DIAG:  Radiculopathy, cervical region  Abnormal posture  Decreased grip strength  Rationale for Evaluation and Treatment: Rehabilitation  ONSET DATE: 09/23/22  SUBJECTIVE:                                                                                                                                                                                                         SUBJECTIVE STATEMENT:  overall better by 40%, , still tingling B Ue's, stil lifting dog several times a day and night.   PERTINENT HISTORY:  2-3 month history of B stocking/ glove like numbness B hands, and runs  lateral/post elbow , notices worse Sx with extending neck to put in eye drops Worse on L hand. Family doctor referred to orthopedist, orthopedist did x rays, and 2 shots, shots did not make much difference.  Spends 1 1/2 hrs at gym 3 x week, has been going to gym for 10 to 15 years.  PAIN:  Are you having pain? No  PRECAUTIONS: None  WEIGHT BEARING RESTRICTIONS: No  FALLS:  Has patient fallen in last 6 months? 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, lifts the dog several times a week, married   PLOF: Independent  PATIENT GOALS: be able to get numbness to go away B hands  NEXT MD VISIT: unknown  OBJECTIVE:   DIAGNOSTIC FINDINGS:  Not in epic, patient reports orthopedist performed x rays c spine without remarkable findings  PATIENT SURVEYS:  NDI 5/50 or 10% disability  COGNITION: Overall cognitive status: Within functional limits for tasks assessed  SENSATION: Light touch: Impaired   POSTURE: rounded shoulders, forward head, and increased thoracic kyphosis  PALPATION: Tender B upper traps, L greater than R, also  L pec minor, coracobrachialis, pec major, ant scalenes.  Non tender B middle traps, levator Cervical side glides wnl Upper thoracic spine p/a mobility mild restriction   CERVICAL ROM:   Active ROM A/PROM (deg) eval  Flexion wfl  Extension Wfl, L lat tilt noted  Right lateral flexion 30  Left lateral flexion 30  Right rotation 80  Left rotation 80   (Blank rows = not tested)  UPPER EXTREMITY ROM: All B UE ROM wfl   UPPER EXTREMITY MMT:All MMT wfl , ex triceps L 4/5 , ER L 4/5 , intrinsics abd B hands 4-/5, grasp B 4/5   CERVICAL SPECIAL TESTS:  Upper limb tension test (ULTT): Negative   TODAY'S TREATMENT:                                                                                                                               DATE:  01/07/23:  Manual:  Supine for manual stretching alternating bouts of manual cervical distraction, upper traps, levator scapulae stretching, retraction at cervicothoracic jxn, also with intermittent bouts of contract/relax to achieve better muscular lengthening. Suboccipitals PA HVLA mobs T 3, 4, 6 in supine and prone  Therex:  Supine with 6" thoracic roll positioned horizontally overT 4- 6 region for specific localized T 4 to 6 PA self mob  Supine over 1/2 thoracic roll, for pec flys with 2# each hand to achieve dynamic stretching pectorals and ant chest wall, 15 x   01/05/23: Manual:  Trigger Point Dry-Needling  Treatment instructions: Expect mild to moderate muscle soreness. S/S of pneumothorax if dry needled over a lung field, and to seek immediate medical attention should they occur. Patient verbalized understanding of these instructions and education. Patient Consent Given: Yes Education handout provided: Previously provided Muscles treated: B upper traps, B pec major Electrical stimulation performed: No Parameters: N/A Treatment response/outcome: Twitch Response Elicited and Palpable Increase in Muscle Length  Supine for manual stretching alternating bouts of manual cervical distraction, upper traps, levator scapulae stretching, retraction at cervicothoracic jxn, also with intermittent bouts of contract/relax to achieve better muscular lengthening.   Kinesiotaping, 2 I  pieces, from thoracic spinous processes to medial border scapulae, applied with patient in rounded position  Therex: standing pulley stack, PNF pattern shoulder flex, add, Er to ext, abd, IR, 10 # each, 15 reps, cues to retract and depress scapula  Standing pulley stack pec flys, 10 # each, 2 sets 15 reps, cues to depress shoulders.  01/01/23 UBE 3 min fwd and back Cable Pulley shld ext 10# 2 sets 10 Cable Pulley shld row 15# 2 sets  10 Seated row,shld ext and horz abd 2 sets 10 6 #  3# 4 pt rhy stab 10 x each Head on ball on wall with retraction and 3# UE exercises ( shld flex,chest press, upright row and abd) Supine horz abd 10 x then diagnols 10 x each PROM and stretching cspine in supine with manual cerv traction  DN performed by Octavio Graves PT cerv/trap area    12/30/22 UBE L 5 2 min each way Seated row and Lat Pull Down 2 sets 12  45#- educ to do at gym and cued to increase posture Flys and Chest press - again educ for gym and correct tech HEP issued and performed- with postural cuing needed as he tends to engage trap Reviewed Pec stretching as he doing more of lat stretch  DN performed by Octavio Graves PT cerv/trap area  Mech cerv traction 12 min   12/22/22:   PATIENT EDUCATION:  Education details: POC, goals Person educated: Patient Education method: Explanation, Demonstration, Tactile cues, and Verbal cues Education comprehension: verbalized understanding, verbal cues required, tactile cues required, and needs further education  HOME EXERCISE PROGRAM:  12/30/22 Access Code: [email or web address] URL: [email or web address] Date: 12/30/2022 Prepared by: Marylene Land Payseur  Exercises - Standing Shoulder Row with Anchored Resistance  - 1 x daily - 7 x weekly - 1 sets - 15 reps - Shoulder extension with resistance - Neutral  - 1 x daily - 7 x weekly - 1 sets - 15 reps - Standing Row with Resistance with Anchored Resistance at Chest Height Palms Down  - 1 x daily - 7 x weekly - 1 sets - 15 reps - Shoulder External Rotation and Scapular Retraction with Resistance  - 1 x daily - 7 x weekly - 1 sets - 15 reps - Standing Isometric Cervical Retraction with Chin Tucks and Ball at [employer]  - 1 x daily - 7 x weekly - 2-3 sets - 10 reps - 3 hold  ASSESSMENT:  CLINICAL IMPRESSION: continues to attend skilled PT due to  tingling B Ue's.  Noted with positioning to stretch chest wall over thoracic roll that patient had  color change and diaphoresis b hands, however improved with dynamic stretching with the pec flys and light wts.  He had replication and increased intensity of his B Sx with upper thoracic palpation today.  Would benefit from additonal focus, jt mobs in this area at future visits. Did not utilize dry needling today, can resume again if needed.  Continues to be a good candidate for skilled PT to address his B cervical radiculopathy  OBJECTIVE IMPAIRMENTS: decreased strength, increased fascial restrictions, increased muscle spasms, impaired sensation, impaired UE functional use, and postural dysfunction.   ACTIVITY LIMITATIONS: lifting and reach over head  PARTICIPATION LIMITATIONS: community activity and yard work  PERSONAL FACTORS: Behavior pattern, Fitness, Past/current experiences, and Time since onset of injury/illness/exacerbation are also affecting patient's functional outcome.   REHAB POTENTIAL: Good  CLINICAL DECISION MAKING: Stable/uncomplicated  EVALUATION COMPLEXITY: Low   GOALS: Goals reviewed with  patient? Yes  SHORT TERM GOALS: Target date: 2 weeks 01/05/23  I HEP for management of Sx Baseline:  Goal status: progressing 12/30/22   LONG TERM GOALS: Target date: 03/16/23  NDI improve from 10% to 5 % disability Baseline:  Goal status: INITIAL  2.  Increase strength L triceps, L shoulder ER to 5/5 Baseline: 4/5 Goal status: INITIAL  3.  Improve parasthesia B hands by 50% with global improvement rating Baseline: constant Goal status: INITIAL    PLAN:  PT FREQUENCY: 1-2x/week  PT DURATION: 12 weeks  PLANNED INTERVENTIONS: Therapeutic exercises, Therapeutic activity, Neuromuscular re-education, Balance training, Gait training, Patient/Family education, Self Care, and Joint mobilization  PLAN FOR NEXT SESSION: utilize dry needling, stretching of pecs, upper traps, levator, strengthen ER's shoulders.how was the kinesiotaping   Early Chars, PT 01/07/2023, 12:33  PM     [facility] at [facility] [address] [address] [place], [place], [address] Phone: [phone]   Fax:  [phone]  Patient Details  Name: Robert Gaines MRN: [id number] Date of Birth: May 04, 1946 Referring Provider:  Shelva Majestic, MD  Encounter Date: 01/07/2023

## 2023-01-12 ENCOUNTER — Ambulatory Visit: Payer: Medicare PPO | Admitting: Physical Therapy

## 2023-01-12 ENCOUNTER — Encounter: Payer: Self-pay | Admitting: Physical Therapy

## 2023-01-12 DIAGNOSIS — R293 Abnormal posture: Secondary | ICD-10-CM

## 2023-01-12 DIAGNOSIS — M5412 Radiculopathy, cervical region: Secondary | ICD-10-CM

## 2023-01-12 DIAGNOSIS — R29898 Other symptoms and signs involving the musculoskeletal system: Secondary | ICD-10-CM | POA: Diagnosis not present

## 2023-01-12 NOTE — Therapy (Signed)
OUTPATIENT PHYSICAL THERAPY CERVICAL    Patient Name: Robert Gaines MRN: [id number] DOB:1946-03-21, 77 y.o., male Today's Date: 01/12/2023  END OF SESSION:  PT End of Session - 01/12/23 0924     Visit Number 6    Date for PT Re-Evaluation 03/16/23    PT Start Time 0924    PT Stop Time 1015    PT Time Calculation (min) 51 min    Activity Tolerance Patient tolerated treatment well    Behavior During Therapy [facility] for tasks assessed/performed             Past Medical History:  Diagnosis Date   BPH associated with nocturia 10/03/2014   Depression    Hyperlipidemia    Hypertension    PSA, INCREASED 04/16/2010   History prostatitis x 2, # decreased with ciprofloxacin     Rosacea    Past Surgical History:  Procedure Laterality Date   FLEXIBLE SIGMOIDOSCOPY     TONSILLECTOMY     at around 46-9 yrs old   WISDOM TOOTH EXTRACTION     Patient Active Problem List   Diagnosis Date Noted   Hyperglycemia 10/03/2014   BPH associated with nocturia 10/03/2014   Seasonal allergies 05/11/2014   PSA, INCREASED 04/16/2010   Gout 06/21/2009   Chronic kidney disease, stage III (moderate) (HCC) 04/05/2008   Hypertension 05/31/2007   Hyperlipidemia 05/24/2007   DEPRESSION 05/24/2007    PCP: Tana Conch, MD  REFERRING PROVIDER: Teryl Lucy, MD  REFERRING DIAG: cervical radiculopathy  THERAPY DIAG:  Radiculopathy, cervical region  Abnormal posture  Rationale for Evaluation and Treatment: Rehabilitation  ONSET DATE: 09/23/22  SUBJECTIVE:                                                                                                                                                                                                         SUBJECTIVE STATEMENT: "I think it is helping. " PERTINENT HISTORY:  2-3 month history of B stocking/ glove like numbness B hands, and runs lateral/post elbow , notices worse Sx with extending neck to put in eye drops Worse on L hand. Family  doctor referred to orthopedist, orthopedist did x rays, and 2 shots, shots did not make much difference.  Spends 1 1/2 hrs at gym 3 x week, has been going to gym for 10 to 15 years.  PAIN:  Are you having pain? No  PRECAUTIONS: None  WEIGHT BEARING RESTRICTIONS: No  FALLS:  Has patient fallen in last 6 months? 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, lifts the dog several times a week, married   PLOF: Independent  PATIENT GOALS: be able to get numbness to go away B hands  NEXT MD VISIT: unknown  OBJECTIVE:   DIAGNOSTIC FINDINGS:  Not in epic, patient reports orthopedist performed x rays c spine without remarkable findings  PATIENT SURVEYS:  NDI 5/50 or 10% disability  COGNITION: Overall cognitive status: Within functional limits for tasks assessed  SENSATION: Light touch: Impaired   POSTURE: rounded shoulders, forward head, and increased thoracic kyphosis  PALPATION: Tender B upper traps, L greater than R, also  L pec minor, coracobrachialis, pec major, ant scalenes.  Non tender B middle traps, levator Cervical side glides wnl Upper thoracic spine p/a mobility mild restriction   CERVICAL ROM:   Active ROM A/PROM (deg) eval  Flexion wfl  Extension Wfl, L lat tilt noted  Right lateral flexion 30  Left lateral flexion 30  Right rotation 80  Left rotation 80   (Blank rows = not tested)  UPPER EXTREMITY ROM: All B UE ROM wfl   UPPER EXTREMITY MMT:All MMT wfl , ex triceps L 4/5 , ER L 4/5 , intrinsics abd B hands 4-/5, grasp B 4/5   CERVICAL SPECIAL TESTS:  Upper limb tension test (ULTT): Negative   TODAY'S TREATMENT:                                                                                                                              DATE:  01/12/23 Nustep level 5  x 6 minutes Feet on ball K2C, rotation, bridge, iso abs HS stretch Standing 10# straight arm pulls, cues for posture and core DN to the upper traps and the rhomboids after consent good LTR's Thoracic PA and rotational glides Rib mobilizations STM to the rhomboids, upper traps and the neck Passive ROM of the cervical spine, occipital release   01/07/23:  Manual:  Supine for manual stretching alternating bouts of manual cervical distraction, upper traps, levator scapulae stretching, retraction at cervicothoracic jxn, also with intermittent bouts of contract/relax to achieve better muscular lengthening. Suboccipitals PA HVLA mobs T 3, 4, 6 in supine and prone  Therex:  Supine with 6" thoracic roll positioned horizontally overT 4- 6 region for specific localized T 4 to 6 PA self mob  Supine over 1/2 thoracic roll, for pec flys with 2# each hand to achieve dynamic stretching pectorals and ant chest wall, 15 x   01/05/23: Manual:  Trigger Point Dry-Needling  Treatment instructions: Expect mild to moderate muscle soreness. S/S of pneumothorax if dry needled over a lung field, and to seek immediate medical attention should they occur. Patient verbalized understanding of these instructions and education. Patient Consent Given: Yes Education handout provided: Previously provided Muscles treated: B upper traps, B pec major Electrical stimulation performed: No Parameters: N/A Treatment  response/outcome: Twitch Response Elicited and Palpable Increase in Muscle Length  Supine for manual stretching alternating bouts of manual cervical distraction, upper traps, levator scapulae stretching, retraction at cervicothoracic jxn, also with intermittent bouts of contract/relax to achieve better muscular lengthening.   Kinesiotaping, 2 I pieces, from thoracic spinous processes to medial border scapulae, applied with patient in rounded position  Therex: standing pulley stack, PNF pattern shoulder flex, add,  Er to ext, abd, IR, 10 # each, 15 reps, cues to retract and depress scapula  Standing pulley stack pec flys, 10 # each, 2 sets 15 reps, cues to depress shoulders.  01/01/23 UBE 3 min fwd and back Cable Pulley shld ext 10# 2 sets 10 Cable Pulley shld row 15# 2 sets 10 Seated row,shld ext and horz abd 2 sets 10 6 #  3# 4 pt rhy stab 10 x each Head on ball on wall with retraction and 3# UE exercises ( shld flex,chest press, upright row and abd) Supine horz abd 10 x then diagnols 10 x each PROM and stretching cspine in supine with manual cerv traction  DN performed by Octavio Graves PT cerv/trap area    12/30/22 UBE L 5 2 min each way Seated row and Lat Pull Down 2 sets 12  45#- educ to do at gym and cued to increase posture Flys and Chest press - again educ for gym and correct tech HEP issued and performed- with postural cuing needed as he tends to engage trap Reviewed Pec stretching as he doing more of lat stretch  DN performed by Octavio Graves PT cerv/trap area  Mech cerv traction 12 min   12/22/22:   PATIENT EDUCATION:  Education details: POC, goals Person educated: Patient Education method: Explanation, Demonstration, Tactile cues, and Verbal cues Education comprehension: verbalized understanding, verbal cues required, tactile cues required, and needs further education  HOME EXERCISE PROGRAM:  12/30/22 Access Code: [email or web address] URL: [email or web address] Date: 12/30/2022 Prepared by: Marylene Land Payseur  Exercises - Standing Shoulder Row with Anchored Resistance  - 1 x daily - 7 x weekly - 1 sets - 15 reps - Shoulder extension with resistance - Neutral  - 1 x daily - 7 x weekly - 1 sets - 15 reps - Standing Row with Resistance with Anchored Resistance at Chest Height Palms Down  - 1 x daily - 7 x weekly - 1 sets - 15 reps - Shoulder External Rotation and Scapular Retraction with Resistance  - 1 x daily - 7 x weekly - 1 sets - 15 reps - Standing Isometric Cervical Retraction  with Chin Tucks and Ball at [employer]  - 1 x daily - 7 x weekly - 2-3 sets - 10 reps - 3 hold  ASSESSMENT:  CLINICAL IMPRESSION: Patient reports that he feels that this treatment is helping overall with less pain, still with c/o tingling in the hands.  I continued with some mobilization of the thoracic spine and ribs, added some stretches with occipital release.  He reports that all of the above feels good, he does exercise 3x/week at the gym and we may want to look at what he is doing and his form  OBJECTIVE IMPAIRMENTS: decreased strength, increased fascial restrictions, increased muscle spasms, impaired sensation, impaired UE functional use, and postural dysfunction.   ACTIVITY LIMITATIONS: lifting and reach over head  PARTICIPATION LIMITATIONS: community activity and yard work  PERSONAL FACTORS: Behavior pattern, Fitness, Past/current experiences, and Time since onset of injury/illness/exacerbation are also affecting patient's functional outcome.   REHAB POTENTIAL:  Good  CLINICAL DECISION MAKING: Stable/uncomplicated  EVALUATION COMPLEXITY: Low   GOALS: Goals reviewed with patient? Yes  SHORT TERM GOALS: Target date: 2 weeks 01/05/23  I HEP for management of Sx Baseline:  Goal status: progressing 12/30/22   LONG TERM GOALS: Target date: 03/16/23  NDI improve from 10% to 5 % disability Baseline:  Goal status: progressing 01/12/23  2.  Increase strength L triceps, L shoulder ER to 5/5 Baseline: 4/5 Goal status: INITIAL  3.  Improve parasthesia B hands by 50% with global improvement rating Baseline: constant Goal status: INITIAL    PLAN:  PT FREQUENCY: 1-2x/week  PT DURATION: 12 weeks  PLANNED INTERVENTIONS: Therapeutic exercises, Therapeutic activity, Neuromuscular re-education, Balance training, Gait training, Patient/Family education, Self Care, and Joint mobilization  PLAN FOR NEXT SESSION: utilize dry needling, stretching of pecs, upper traps, levator, strengthen  ER's shoulders.how was the kinesiotaping   Jearld Lesch, PT 01/12/2023, 9:25 AM

## 2023-01-15 ENCOUNTER — Ambulatory Visit: Payer: Medicare PPO | Admitting: Physical Therapy

## 2023-01-15 ENCOUNTER — Encounter: Payer: Self-pay | Admitting: Physical Therapy

## 2023-01-15 DIAGNOSIS — M5412 Radiculopathy, cervical region: Secondary | ICD-10-CM | POA: Diagnosis not present

## 2023-01-15 DIAGNOSIS — R29898 Other symptoms and signs involving the musculoskeletal system: Secondary | ICD-10-CM | POA: Diagnosis not present

## 2023-01-15 DIAGNOSIS — R293 Abnormal posture: Secondary | ICD-10-CM | POA: Diagnosis not present

## 2023-01-15 NOTE — Therapy (Signed)
OUTPATIENT PHYSICAL THERAPY CERVICAL    Patient Name: Robert Gaines MRN: [id number] DOB:Sep 22, 1945, 77 y.o., male Today's Date: 01/15/2023  END OF SESSION:  PT End of Session - 01/15/23 0925     Visit Number 7    Date for PT Re-Evaluation 03/16/23    PT Start Time 0924    PT Stop Time 1015    PT Time Calculation (min) 51 min    Activity Tolerance Patient tolerated treatment well    Behavior During Therapy [facility] for tasks assessed/performed             Past Medical History:  Diagnosis Date   BPH associated with nocturia 10/03/2014   Depression    Hyperlipidemia    Hypertension    PSA, INCREASED 04/16/2010   History prostatitis x 2, # decreased with ciprofloxacin     Rosacea    Past Surgical History:  Procedure Laterality Date   FLEXIBLE SIGMOIDOSCOPY     TONSILLECTOMY     at around 36-9 yrs old   WISDOM TOOTH EXTRACTION     Patient Active Problem List   Diagnosis Date Noted   Hyperglycemia 10/03/2014   BPH associated with nocturia 10/03/2014   Seasonal allergies 05/11/2014   PSA, INCREASED 04/16/2010   Gout 06/21/2009   Chronic kidney disease, stage III (moderate) (HCC) 04/05/2008   Hypertension 05/31/2007   Hyperlipidemia 05/24/2007   DEPRESSION 05/24/2007    PCP: Tana Conch, MD  REFERRING PROVIDER: Teryl Lucy, MD  REFERRING DIAG: cervical radiculopathy  THERAPY DIAG:  Radiculopathy, cervical region  Abnormal posture  Decreased grip strength  Rationale for Evaluation and Treatment: Rehabilitation  ONSET DATE: 09/23/22  SUBJECTIVE:                                                                                                                                                                                                         SUBJECTIVE STATEMENT: I think the last treatment helped, a little less tingling PERTINENT HISTORY:  2-3 month history of B stocking/ glove like numbness B hands, and runs lateral/post elbow , notices worse Sx with  extending neck to put in eye drops Worse on L hand. Family doctor referred to orthopedist, orthopedist did x rays, and 2 shots, shots did not make much difference.  Spends 1 1/2 hrs at gym 3 x week, has been going to gym for 10 to 15 years.  PAIN:  Are you having pain? No  PRECAUTIONS: None  WEIGHT BEARING RESTRICTIONS: No  FALLS:  Has patient fallen in last 6 months? 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, lifts the dog several times a week, married   PLOF: Independent  PATIENT GOALS: be able to get numbness to go away B hands  NEXT MD VISIT: unknown  OBJECTIVE:   DIAGNOSTIC FINDINGS:  Not in epic, patient reports orthopedist performed x rays c spine without remarkable findings  PATIENT SURVEYS:  NDI 5/50 or 10% disability  COGNITION: Overall cognitive status: Within functional limits for tasks assessed  SENSATION: Light touch: Impaired   POSTURE: rounded shoulders, forward head, and increased thoracic kyphosis  PALPATION: Tender B upper traps, L greater than R, also  L pec minor, coracobrachialis, pec major, ant scalenes.  Non tender B middle traps, levator Cervical side glides wnl Upper thoracic spine p/a mobility mild restriction   CERVICAL ROM:   Active ROM A/PROM (deg) eval  Flexion wfl  Extension Wfl, L lat tilt noted  Right lateral flexion 30  Left lateral flexion 30  Right rotation 80  Left rotation 80   (Blank rows = not tested)  UPPER EXTREMITY ROM: All B UE ROM wfl   UPPER EXTREMITY MMT:All MMT wfl , ex triceps L 4/5 , ER L 4/5 , intrinsics abd B hands 4-/5, grasp B 4/5   CERVICAL SPECIAL TESTS:  Upper limb tension test (ULTT): Negative   TODAY'S TREATMENT:                                                                                                                               DATE:  01/15/23 UBE level 4 x 6 mintues 25# face pulls 25# axe chops 6# single arm overhead carry one lap each 20# farmer carry 6# back to wall overhead reach Green tband horizontal abduction Green tband ER DN to the upper traps into rhomboids STM to the upper traps and rhomboids  01/12/23 Nustep level 5 x 6 minutes Feet on ball K2C, rotation, bridge, iso abs HS stretch Standing 10# straight arm pulls, cues for posture and core DN to the upper traps and the rhomboids after consent good LTR's Thoracic PA and rotational glides Rib mobilizations STM to the rhomboids, upper traps and the neck Passive ROM of the cervical spine, occipital release   01/07/23:  Manual:  Supine for manual stretching alternating bouts of manual cervical distraction, upper traps, levator scapulae stretching, retraction at cervicothoracic jxn, also with intermittent bouts of contract/relax to achieve better muscular lengthening. Suboccipitals PA HVLA mobs T 3, 4, 6 in supine and prone  Therex:  Supine with 6" thoracic roll positioned horizontally overT 4- 6 region for specific localized T 4 to 6 PA self mob  Supine over 1/2 thoracic roll, for pec flys with 2# each hand to achieve dynamic stretching pectorals and ant chest wall, 15 x   01/05/23: Manual:  Trigger Point Dry-Needling  Treatment instructions: Expect mild to moderate muscle soreness. S/S of pneumothorax if dry needled over a lung field, and to seek immediate medical attention should they occur. Patient verbalized understanding of these instructions and education. Patient Consent Given: Yes Education handout provided: Previously provided Muscles treated: B upper traps, B pec major Electrical stimulation performed: No Parameters: N/A Treatment response/outcome: Twitch Response Elicited and Palpable Increase in Muscle Length  Supine for manual stretching alternating bouts of manual cervical distraction, upper traps,  levator scapulae stretching, retraction at cervicothoracic jxn, also with intermittent bouts of contract/relax to achieve better muscular lengthening.   Kinesiotaping, 2 I pieces, from thoracic spinous processes to medial border scapulae, applied with patient in rounded position  Therex: standing pulley stack, PNF pattern shoulder flex, add, Er to ext, abd, IR, 10 # each, 15 reps, cues to retract and depress scapula  Standing pulley stack pec flys, 10 # each, 2 sets 15 reps, cues to depress shoulders.  01/01/23 UBE 3 min fwd and back Cable Pulley shld ext 10# 2 sets 10 Cable Pulley shld row 15# 2 sets 10 Seated row,shld ext and horz abd 2 sets 10 6 #  3# 4 pt rhy stab 10 x each Head on ball on wall with retraction and 3# UE exercises ( shld flex,chest press, upright row and abd) Supine horz abd 10 x then diagnols 10 x each PROM and stretching cspine in supine with manual cerv traction  DN performed by Octavio Graves PT cerv/trap area    12/30/22 UBE L 5 2 min each way Seated row and Lat Pull Down 2 sets 12  45#- educ to do at gym and cued to increase posture Flys and Chest press - again educ for gym and correct tech HEP issued and performed- with postural cuing needed as he tends to engage trap Reviewed Pec stretching as he doing more of lat stretch  DN performed by Octavio Graves PT cerv/trap area  Mech cerv traction 12 min   12/22/22:   PATIENT EDUCATION:  Education details: POC, goals Person educated: Patient Education method: Explanation, Demonstration, Tactile cues, and Verbal cues Education comprehension: verbalized understanding, verbal cues required, tactile cues required, and needs further education  HOME EXERCISE PROGRAM:  12/30/22 Access Code: [email or web address] URL: [email or web address] Date: 12/30/2022 Prepared by: Marylene Land Payseur  Exercises - Standing Shoulder Row with Anchored Resistance  - 1 x daily - 7 x weekly - 1 sets - 15 reps - Shoulder extension with  resistance - Neutral  - 1 x daily - 7 x weekly - 1 sets - 15 reps - Standing Row with Resistance with Anchored Resistance at Chest Height Palms Down  - 1 x daily - 7 x weekly - 1 sets - 15 reps - Shoulder External Rotation and Scapular Retraction with Resistance  - 1 x daily - 7 x weekly - 1 sets - 15 reps - Standing Isometric Cervical Retraction with Chin Tucks and Ball at [employer]  - 1 x daily - 7 x weekly - 2-3 sets - 10 reps - 3 hold  ASSESSMENT:  CLINICAL IMPRESSION: Patient rwith some significant knots in the upper traps, I added some exercises today and we did talk about not doing heavy weights and focus on form, again good ability to follow directions with little verbal and tactile cues for posture  OBJECTIVE IMPAIRMENTS: decreased strength, increased fascial restrictions, increased muscle spasms, impaired sensation, impaired UE functional use, and postural dysfunction.   ACTIVITY LIMITATIONS: lifting and reach over head  PARTICIPATION  LIMITATIONS: community activity and yard work  PERSONAL FACTORS: Behavior pattern, Fitness, Past/current experiences, and Time since onset of injury/illness/exacerbation are also affecting patient's functional outcome.   REHAB POTENTIAL: Good  CLINICAL DECISION MAKING: Stable/uncomplicated  EVALUATION COMPLEXITY: Low   GOALS: Goals reviewed with patient? Yes  SHORT TERM GOALS: Target date: 2 weeks 01/05/23  I HEP for management of Sx Baseline:  Goal status: progressing 12/30/22   LONG TERM GOALS: Target date: 03/16/23  NDI improve from 10% to 5 % disability Baseline:  Goal status: progressing 01/12/23  2.  Increase strength L triceps, L shoulder ER to 5/5 Baseline: 4/5 Goal status: INITIAL  3.  Improve parasthesia B hands by 50% with global improvement rating Baseline: constant Goal status: ongoing 01/15/23    PLAN:  PT FREQUENCY: 1-2x/week  PT DURATION: 12 weeks  PLANNED INTERVENTIONS: Therapeutic exercises, Therapeutic activity,  Neuromuscular re-education, Balance training, Gait training, Patient/Family education, Self Care, and Joint mobilization  PLAN FOR NEXT SESSION: utilize dry needling, stretching of pecs, upper traps, levator, strengthen ER's shoulders.how was the kinesiotaping   Jearld Lesch, PT 01/15/2023, 9:25 AM

## 2023-01-20 ENCOUNTER — Ambulatory Visit: Payer: Medicare PPO | Admitting: Physical Therapy

## 2023-01-21 DIAGNOSIS — H52203 Unspecified astigmatism, bilateral: Secondary | ICD-10-CM | POA: Diagnosis not present

## 2023-01-21 DIAGNOSIS — H25813 Combined forms of age-related cataract, bilateral: Secondary | ICD-10-CM | POA: Diagnosis not present

## 2023-01-21 DIAGNOSIS — H524 Presbyopia: Secondary | ICD-10-CM | POA: Diagnosis not present

## 2023-01-21 DIAGNOSIS — H5201 Hypermetropia, right eye: Secondary | ICD-10-CM | POA: Diagnosis not present

## 2023-01-21 DIAGNOSIS — H5212 Myopia, left eye: Secondary | ICD-10-CM | POA: Diagnosis not present

## 2023-01-22 ENCOUNTER — Ambulatory Visit: Payer: Medicare PPO | Admitting: Physical Therapy

## 2023-01-22 ENCOUNTER — Encounter: Payer: Self-pay | Admitting: Physical Therapy

## 2023-01-22 DIAGNOSIS — R293 Abnormal posture: Secondary | ICD-10-CM

## 2023-01-22 DIAGNOSIS — M5412 Radiculopathy, cervical region: Secondary | ICD-10-CM

## 2023-01-22 DIAGNOSIS — R29898 Other symptoms and signs involving the musculoskeletal system: Secondary | ICD-10-CM | POA: Diagnosis not present

## 2023-01-22 NOTE — Therapy (Signed)
OUTPATIENT PHYSICAL THERAPY CERVICAL    Patient Name: Robert Gaines MRN: [id number] DOB:11-11-1945, 77 y.o., male Today's Date: 01/22/2023  END OF SESSION:  PT End of Session - 01/22/23 0920     Visit Number 8    Date for PT Re-Evaluation 03/16/23    PT Start Time 0920    PT Stop Time 1010    PT Time Calculation (min) 50 min    Activity Tolerance Patient tolerated treatment well    Behavior During Therapy [facility] for tasks assessed/performed             Past Medical History:  Diagnosis Date   BPH associated with nocturia 10/03/2014   Depression    Hyperlipidemia    Hypertension    PSA, INCREASED 04/16/2010   History prostatitis x 2, # decreased with ciprofloxacin     Rosacea    Past Surgical History:  Procedure Laterality Date   FLEXIBLE SIGMOIDOSCOPY     TONSILLECTOMY     at around 25-9 yrs old   WISDOM TOOTH EXTRACTION     Patient Active Problem List   Diagnosis Date Noted   Hyperglycemia 10/03/2014   BPH associated with nocturia 10/03/2014   Seasonal allergies 05/11/2014   PSA, INCREASED 04/16/2010   Gout 06/21/2009   Chronic kidney disease, stage III (moderate) (HCC) 04/05/2008   Hypertension 05/31/2007   Hyperlipidemia 05/24/2007   DEPRESSION 05/24/2007    PCP: Tana Conch, MD  REFERRING PROVIDER: Teryl Lucy, MD  REFERRING DIAG: cervical radiculopathy  THERAPY DIAG:  Radiculopathy, cervical region  Abnormal posture  Decreased grip strength  Rationale for Evaluation and Treatment: Rehabilitation  ONSET DATE: 09/23/22  SUBJECTIVE:                                                                                                                                                                                                         SUBJECTIVE STATEMENT: I think this still helping, I am feeling stronger and maybe a little less tingling PERTINENT HISTORY:  2-3 month history of B stocking/ glove like numbness B hands, and runs lateral/post elbow ,  notices worse Sx with extending neck to put in eye drops Worse on L hand. Family doctor referred to orthopedist, orthopedist did x rays, and 2 shots, shots did not make much difference.  Spends 1 1/2 hrs at gym 3 x week, has been going to gym for 10 to 15 years.  PAIN:  Are you having pain? No  PRECAUTIONS: None  WEIGHT BEARING RESTRICTIONS: No  FALLS:  Has patient fallen in last 6  months? 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, lifts the dog several times a week, married   PLOF: Independent  PATIENT GOALS: be able to get numbness to go away B hands  NEXT MD VISIT: unknown  OBJECTIVE:   DIAGNOSTIC FINDINGS:  Not in epic, patient reports orthopedist performed x rays c spine without remarkable findings  PATIENT SURVEYS:  NDI 5/50 or 10% disability  COGNITION: Overall cognitive status: Within functional limits for tasks assessed  SENSATION: Light touch: Impaired   POSTURE: rounded shoulders, forward head, and increased thoracic kyphosis  PALPATION: Tender B upper traps, L greater than R, also  L pec minor, coracobrachialis, pec major, ant scalenes.  Non tender B middle traps, levator Cervical side glides wnl Upper thoracic spine p/a mobility mild restriction   CERVICAL ROM:   Active ROM A/PROM (deg) eval  Flexion wfl  Extension Wfl, L lat tilt noted  Right lateral flexion 30  Left lateral flexion 30  Right rotation 80  Left rotation 80   (Blank rows = not tested)  UPPER EXTREMITY ROM: All B UE ROM wfl   UPPER EXTREMITY MMT:All MMT wfl , ex triceps L 4/5 , ER L 4/5 , intrinsics abd B hands 4-/5, grasp B 4/5   CERVICAL SPECIAL TESTS:  Upper limb tension test (ULTT): Negative   TODAY'S TREATMENT:                                                                                                                               DATE:  01/22/23 UBE level 4 x 6 minutes 20# farmer carry 1 lap each arm, 10# both arms together overhead, then 6# carry single arms on lap each 20# chest press 35# row 2x10 Lats 35# 2x10 20# face pulls 15# straight arm pulls 25# sit to stand rows a lot of cues needed STM to the upper traps, rhomboids and cervical pspinals Occipital release, chest stretche  01/15/23 UBE level 4 x 6 mintues 25# face pulls 25# axe chops 6# single arm overhead carry one lap each 20# farmer carry 6# back to wall overhead reach Green tband horizontal abduction Green tband ER DN to the upper traps into rhomboids STM to the upper traps and rhomboids  01/12/23 Nustep level 5 x 6 minutes Feet on ball K2C, rotation, bridge, iso abs HS stretch Standing 10# straight arm pulls, cues for posture and core DN to the upper traps and the rhomboids after consent good LTR's Thoracic PA and rotational glides Rib mobilizations STM to the rhomboids, upper traps and the neck Passive ROM of the cervical spine, occipital release   01/07/23:  Manual:  Supine for manual stretching alternating bouts of manual cervical distraction, upper traps, levator scapulae stretching, retraction at cervicothoracic jxn, also with intermittent bouts of contract/relax to  achieve better muscular lengthening. Suboccipitals PA HVLA mobs T 3, 4, 6 in supine and prone  Therex:  Supine with 6" thoracic roll positioned horizontally overT 4- 6 region for specific localized T 4 to 6 PA self mob  Supine over 1/2 thoracic roll, for pec flys with 2# each hand to achieve dynamic stretching pectorals and ant chest wall, 15 x   01/05/23: Manual:  Trigger Point Dry-Needling  Treatment instructions: Expect mild to moderate muscle soreness. S/S of pneumothorax if dry needled over a lung field, and to seek immediate medical attention should they occur. Patient verbalized understanding of these  instructions and education. Patient Consent Given: Yes Education handout provided: Previously provided Muscles treated: B upper traps, B pec major Electrical stimulation performed: No Parameters: N/A Treatment response/outcome: Twitch Response Elicited and Palpable Increase in Muscle Length  Supine for manual stretching alternating bouts of manual cervical distraction, upper traps, levator scapulae stretching, retraction at cervicothoracic jxn, also with intermittent bouts of contract/relax to achieve better muscular lengthening.   Kinesiotaping, 2 I pieces, from thoracic spinous processes to medial border scapulae, applied with patient in rounded position  Therex: standing pulley stack, PNF pattern shoulder flex, add, Er to ext, abd, IR, 10 # each, 15 reps, cues to retract and depress scapula  Standing pulley stack pec flys, 10 # each, 2 sets 15 reps, cues to depress shoulders.  01/01/23 UBE 3 min fwd and back Cable Pulley shld ext 10# 2 sets 10 Cable Pulley shld row 15# 2 sets 10 Seated row,shld ext and horz abd 2 sets 10 6 #  3# 4 pt rhy stab 10 x each Head on ball on wall with retraction and 3# UE exercises ( shld flex,chest press, upright row and abd) Supine horz abd 10 x then diagnols 10 x each PROM and stretching cspine in supine with manual cerv traction  DN performed by Octavio Graves PT cerv/trap area    12/30/22 UBE L 5 2 min each way Seated row and Lat Pull Down 2 sets 12  45#- educ to do at gym and cued to increase posture Flys and Chest press - again educ for gym and correct tech HEP issued and performed- with postural cuing needed as he tends to engage trap Reviewed Pec stretching as he doing more of lat stretch  DN performed by Octavio Graves PT cerv/trap area  Mech cerv traction 12 min   PATIENT EDUCATION:  Education details: POC, goals Person educated: Patient Education method: Explanation, Demonstration, Tactile cues, and Verbal cues Education comprehension:  verbalized understanding, verbal cues required, tactile cues required, and needs further education  HOME EXERCISE PROGRAM:  12/30/22 Access Code: [email or web address] URL: [email or web address] Date: 12/30/2022 Prepared by: Marylene Land Payseur  Exercises - Standing Shoulder Row with Anchored Resistance  - 1 x daily - 7 x weekly - 1 sets - 15 reps - Shoulder extension with resistance - Neutral  - 1 x daily - 7 x weekly - 1 sets - 15 reps - Standing Row with Resistance with Anchored Resistance at Chest Height Palms Down  - 1 x daily - 7 x weekly - 1 sets - 15 reps - Shoulder External Rotation and Scapular Retraction with Resistance  - 1 x daily - 7 x weekly - 1 sets - 15 reps - Standing Isometric Cervical Retraction with Chin Tucks and Ball at [employer]  - 1 x daily - 7 x weekly - 2-3 sets - 10 reps - 3 hold  ASSESSMENT:  CLINICAL IMPRESSION: Patient doing  well, he needed a lot of cues to keep his cervical position, he tends to extend the neck with pushing and pulling, he needs cues to help with not pulling and pushing too far as he tends to try to over do it and has some difficulty with form  OBJECTIVE IMPAIRMENTS: decreased strength, increased fascial restrictions, increased muscle spasms, impaired sensation, impaired UE functional use, and postural dysfunction.   ACTIVITY LIMITATIONS: lifting and reach over head  PARTICIPATION LIMITATIONS: community activity and yard work  PERSONAL FACTORS: Behavior pattern, Fitness, Past/current experiences, and Time since onset of injury/illness/exacerbation are also affecting patient's functional outcome.   REHAB POTENTIAL: Good  CLINICAL DECISION MAKING: Stable/uncomplicated  EVALUATION COMPLEXITY: Low   GOALS: Goals reviewed with patient? Yes  SHORT TERM GOALS: Target date: 2 weeks 01/05/23  I HEP for management of Sx Baseline:  Goal status: progressing 12/30/22   LONG TERM GOALS: Target date: 03/16/23  NDI improve from 10% to 5 %  disability Baseline:  Goal status: progressing 01/12/23  2.  Increase strength L triceps, L shoulder ER to 5/5 Baseline: 4/5 Goal status: INITIAL  3.  Improve parasthesia B hands by 50% with global improvement rating Baseline: constant Goal status: ongoing 01/15/23    PLAN:  PT FREQUENCY: 1-2x/week  PT DURATION: 12 weeks  PLANNED INTERVENTIONS: Therapeutic exercises, Therapeutic activity, Neuromuscular re-education, Balance training, Gait training, Patient/Family education, Self Care, and Joint mobilization  PLAN FOR NEXT SESSION: utilize dry needling, stretching of pecs, upper traps, levator, strengthen ER's shoulders.how was the kinesiotaping   Jearld Lesch, PT 01/22/2023, 9:21 AM

## 2023-01-28 ENCOUNTER — Other Ambulatory Visit: Payer: Self-pay

## 2023-01-28 ENCOUNTER — Ambulatory Visit: Payer: Medicare PPO | Attending: Orthopedic Surgery

## 2023-01-28 DIAGNOSIS — R293 Abnormal posture: Secondary | ICD-10-CM | POA: Insufficient documentation

## 2023-01-28 DIAGNOSIS — M5412 Radiculopathy, cervical region: Secondary | ICD-10-CM | POA: Diagnosis not present

## 2023-01-28 DIAGNOSIS — R29898 Other symptoms and signs involving the musculoskeletal system: Secondary | ICD-10-CM | POA: Diagnosis not present

## 2023-01-28 NOTE — Therapy (Signed)
OUTPATIENT PHYSICAL THERAPY CERVICAL    Patient Name: Robert Gaines MRN: [id number] DOB:[birth year], 77 y.o., male Today's Date: 01/28/2023  END OF SESSION:  PT End of Session - 01/28/23 1349     Visit Number 9    Date for PT Re-Evaluation 03/16/23    Progress Note Due on Visit 10    PT Start Time 1346    PT Stop Time 1430    PT Time Calculation (min) 44 min    Activity Tolerance Patient tolerated treatment well    Behavior During Therapy [facility] for tasks assessed/performed              Past Medical History:  Diagnosis Date   BPH associated with nocturia 10/03/2014   Depression    Hyperlipidemia    Hypertension    PSA, INCREASED 04/16/2010   History prostatitis x 2, # decreased with ciprofloxacin     Rosacea    Past Surgical History:  Procedure Laterality Date   FLEXIBLE SIGMOIDOSCOPY     TONSILLECTOMY     at around 52-9 yrs old   WISDOM TOOTH EXTRACTION     Patient Active Problem List   Diagnosis Date Noted   Hyperglycemia 10/03/2014   BPH associated with nocturia 10/03/2014   Seasonal allergies 05/11/2014   PSA, INCREASED 04/16/2010   Gout 06/21/2009   Chronic kidney disease, stage III (moderate) (HCC) 04/05/2008   Hypertension 05/31/2007   Hyperlipidemia 05/24/2007   DEPRESSION 05/24/2007    PCP: Tana Conch, MD  REFERRING PROVIDER: Teryl Lucy, MD  REFERRING DIAG: cervical radiculopathy  THERAPY DIAG:  Radiculopathy, cervical region  Abnormal posture  Decreased grip strength  Rationale for Evaluation and Treatment: Rehabilitation  ONSET DATE: 09/23/22  SUBJECTIVE:                                                                                                                                                                                                         SUBJECTIVE STATEMENT: I think this still helping, I am feeling stronger and less tingling PERTINENT HISTORY:  2-3 month history of B stocking/ glove like numbness B hands, and runs  lateral/post elbow , notices worse Sx with extending neck to put in eye drops Worse on L hand. Family doctor referred to orthopedist, orthopedist did x rays, and 2 shots, shots did not make much difference.  Spends 1 1/2 hrs at gym 3 x week, has been going to gym for 10 to 15 years.  PAIN:  Are you having pain? No  PRECAUTIONS: None  WEIGHT BEARING RESTRICTIONS: No  FALLS:  Has patient fallen in last 6 months? 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, lifts the dog several times a week, married   PLOF: Independent  PATIENT GOALS: be able to get numbness to go away B hands  NEXT MD VISIT: unknown  OBJECTIVE:   DIAGNOSTIC FINDINGS:  Not in epic, patient reports orthopedist performed x rays c spine without remarkable findings  PATIENT SURVEYS:  NDI 5/50 or 10% disability  COGNITION: Overall cognitive status: Within functional limits for tasks assessed  SENSATION: Light touch: Impaired   POSTURE: rounded shoulders, forward head, and increased thoracic kyphosis  PALPATION: Tender B upper traps, L greater than R, also  L pec minor, coracobrachialis, pec major, ant scalenes.  Non tender B middle traps, levator Cervical side glides wnl Upper thoracic spine p/a mobility mild restriction   CERVICAL ROM:   Active ROM A/PROM (deg) eval  Flexion wfl  Extension Wfl, L lat tilt noted  Right lateral flexion 30  Left lateral flexion 30  Right rotation 80  Left rotation 80   (Blank rows = not tested)  UPPER EXTREMITY ROM: All B UE ROM wfl   UPPER EXTREMITY MMT:All MMT wfl , ex triceps L 4/5 , ER L 4/5 , intrinsics abd B hands 4-/5, grasp B 4/5   CERVICAL SPECIAL TESTS:  Upper limb tension test (ULTT): Negative   TODAY'S TREATMENT:                                                                                                                               DATE:  01/28/23: Manual: HVLA mobs PA T 4,5,6 Also cervicothoracic jxn B  dry needling B UT, B infraspinatus. Trigger Point Dry-Needling  Treatment instructions: Expect mild to moderate muscle soreness. S/S of pneumothorax if dry needled over a lung field, and to seek immediate medical attention should they occur. Patient verbalized understanding of these instructions and education. Patient Consent Given: Yes Education handout provided: No Muscles treated: B UT, B infraspin Electrical stimulation performed: No Parameters: N/A Treatment response/outcome: Twitch Response Elicited and Palpable Increase in Muscle Length Supine for bouts of intermittent manual cervical distraction and manual stretch for B upper traps and B levator  Therex: 35# row 2x10 Lats 35# 2x10 15# straight arm pulls Pecs stretch in doorway Instructed in combined wrist ext and elbow ext stretch   01/22/23 UBE level 4 x 6 minutes 20# farmer carry 1 lap each arm, 10# both arms together overhead, then 6# carry single arms on lap each 20# chest press 35# row 2x10 Lats 35# 2x10 20# face pulls 15# straight arm pulls 25# sit to stand rows a lot of cues needed STM to the upper traps, rhomboids and cervical pspinals Occipital release, chest stretche  01/15/23 UBE level 4 x 6 mintues 25#  face pulls 25# axe chops 6# single arm overhead carry one lap each 20# farmer carry 6# back to wall overhead reach Green tband horizontal abduction Green tband ER DN to the upper traps into rhomboids STM to the upper traps and rhomboids  01/12/23 Nustep level 5 x 6 minutes Feet on ball K2C, rotation, bridge, iso abs HS stretch Standing 10# straight arm pulls, cues for posture and core DN to the upper traps and the rhomboids after consent good LTR's Thoracic PA and rotational glides Rib mobilizations STM to the rhomboids, upper traps and the neck Passive  ROM of the cervical spine, occipital release   01/07/23:  Manual:  Supine for manual stretching alternating bouts of manual cervical distraction, upper traps, levator scapulae stretching, retraction at cervicothoracic jxn, also with intermittent bouts of contract/relax to achieve better muscular lengthening. Suboccipitals PA HVLA mobs T 3, 4, 6 in supine and prone  Therex:  Supine with 6" thoracic roll positioned horizontally overT 4- 6 region for specific localized T 4 to 6 PA self mob  Supine over 1/2 thoracic roll, for pec flys with 2# each hand to achieve dynamic stretching pectorals and ant chest wall, 15 x   01/05/23: Manual:  Trigger Point Dry-Needling  Treatment instructions: Expect mild to moderate muscle soreness. S/S of pneumothorax if dry needled over a lung field, and to seek immediate medical attention should they occur. Patient verbalized understanding of these instructions and education. Patient Consent Given: Yes Education handout provided: Previously provided Muscles treated: B upper traps, B pec major Electrical stimulation performed: No Parameters: N/A Treatment response/outcome: Twitch Response Elicited and Palpable Increase in Muscle Length  Supine for manual stretching alternating bouts of manual cervical distraction, upper traps, levator scapulae stretching, retraction at cervicothoracic jxn, also with intermittent bouts of contract/relax to achieve better muscular lengthening.   Kinesiotaping, 2 I pieces, from thoracic spinous processes to medial border scapulae, applied with patient in rounded position  Therex: standing pulley stack, PNF pattern shoulder flex, add, Er to ext, abd, IR, 10 # each, 15 reps, cues to retract and depress scapula  Standing pulley stack pec flys, 10 # each, 2 sets 15 reps, cues to depress shoulders.  01/01/23 UBE 3 min fwd and back Cable Pulley shld ext 10# 2 sets 10 Cable Pulley shld row 15# 2 sets 10 Seated row,shld ext and horz abd  2 sets 10 6 #  3# 4 pt rhy stab 10 x each Head on ball on wall with retraction and 3# UE exercises ( shld flex,chest press, upright row and abd) Supine horz abd 10 x then diagnols 10 x each PROM and stretching cspine in supine with manual cerv traction  DN performed by Octavio Graves PT cerv/trap area    12/30/22 UBE L 5 2 min each way Seated row and Lat Pull Down 2 sets 12  45#- educ to do at gym and cued to increase posture Flys and Chest press - again educ for gym and correct tech HEP issued and performed- with postural cuing needed as he tends to engage trap Reviewed Pec stretching as he doing more of lat stretch  DN performed by Octavio Graves PT cerv/trap area  Mech cerv traction 12 min   PATIENT EDUCATION:  Education details: POC, goals Person educated: Patient Education method: Explanation, Demonstration, Tactile cues, and Verbal cues Education comprehension: verbalized understanding, verbal cues required, tactile cues required, and needs further education  HOME EXERCISE PROGRAM:  12/30/22 Access Code: [email or web address] URL: [email or web address] Date: 12/30/2022  Prepared by: Marylene Land Payseur  Exercises - Standing Shoulder Row with Anchored Resistance  - 1 x daily - 7 x weekly - 1 sets - 15 reps - Shoulder extension with resistance - Neutral  - 1 x daily - 7 x weekly - 1 sets - 15 reps - Standing Row with Resistance with Anchored Resistance at Chest Height Palms Down  - 1 x daily - 7 x weekly - 1 sets - 15 reps - Shoulder External Rotation and Scapular Retraction with Resistance  - 1 x daily - 7 x weekly - 1 sets - 15 reps - Standing Isometric Cervical Retraction with Chin Tucks and Ball at [employer]  - 1 x daily - 7 x weekly - 2-3 sets - 10 reps - 3 hold  ASSESSMENT:  CLINICAL IMPRESSION: Patient overall improved with decreased overall intensity of paresthesia B hands.  Still noting change in coloration, clammy in clinic with weight training Ue's .  Responded well to  stretching of biceps, wrist flexors at end of today's session. To continue once next week then up for reassessment the following week.  OBJECTIVE IMPAIRMENTS: decreased strength, increased fascial restrictions, increased muscle spasms, impaired sensation, impaired UE functional use, and postural dysfunction.   ACTIVITY LIMITATIONS: lifting and reach over head  PARTICIPATION LIMITATIONS: community activity and yard work  PERSONAL FACTORS: Behavior pattern, Fitness, Past/current experiences, and Time since onset of injury/illness/exacerbation are also affecting patient's functional outcome.   REHAB POTENTIAL: Good  CLINICAL DECISION MAKING: Stable/uncomplicated  EVALUATION COMPLEXITY: Low   GOALS: Goals reviewed with patient? Yes  SHORT TERM GOALS: Target date: 2 weeks 01/05/23  I HEP for management of Sx Baseline:  Goal status: progressing 12/30/22   LONG TERM GOALS: Target date: 03/16/23  NDI improve from 10% to 5 % disability Baseline:  Goal status: progressing 01/12/23  2.  Increase strength L triceps, L shoulder ER to 5/5 Baseline: 4/5 Goal status: INITIAL  3.  Improve parasthesia B hands by 50% with global improvement rating Baseline: constant Goal status: ongoing 01/15/23    PLAN:  PT FREQUENCY: 1-2x/week  PT DURATION: 12 weeks  PLANNED INTERVENTIONS: Therapeutic exercises, Therapeutic activity, Neuromuscular re-education, Balance training, Gait training, Patient/Family education, Self Care, and Joint mobilization  PLAN FOR NEXT SESSION: utilize dry needling, stretching of pecs, upper traps, levator, strengthen ER's shoulders.  Cailie Bosshart L Mamie Hundertmark, PT 01/28/2023, 5:10 PM

## 2023-01-29 ENCOUNTER — Ambulatory Visit: Payer: Medicare PPO | Admitting: Physical Therapy

## 2023-02-03 ENCOUNTER — Ambulatory Visit: Payer: Medicare PPO | Admitting: Physical Therapy

## 2023-02-03 DIAGNOSIS — M5412 Radiculopathy, cervical region: Secondary | ICD-10-CM | POA: Diagnosis not present

## 2023-02-03 DIAGNOSIS — R293 Abnormal posture: Secondary | ICD-10-CM

## 2023-02-03 DIAGNOSIS — R29898 Other symptoms and signs involving the musculoskeletal system: Secondary | ICD-10-CM | POA: Diagnosis not present

## 2023-02-03 NOTE — Therapy (Signed)
OUTPATIENT PHYSICAL THERAPY CERVICAL  Progress Note Reporting Period 12/22/22 to 02/03/23  See note below for Objective Data and Assessment of Progress/Goals.      Patient Name: Robert Gaines MRN: [id number] DOB:Jun 16, 1946, 77 y.o., male Today's Date: 02/03/2023  END OF SESSION:  PT End of Session - 02/03/23 1010     Visit Number 10    Date for PT Re-Evaluation 03/16/23    PT Start Time 1008    PT Stop Time 1100    PT Time Calculation (min) 52 min              Past Medical History:  Diagnosis Date   BPH associated with nocturia 10/03/2014   Depression    Hyperlipidemia    Hypertension    PSA, INCREASED 04/16/2010   History prostatitis x 2, # decreased with ciprofloxacin     Rosacea    Past Surgical History:  Procedure Laterality Date   FLEXIBLE SIGMOIDOSCOPY     TONSILLECTOMY     at around 18-9 yrs old   WISDOM TOOTH EXTRACTION     Patient Active Problem List   Diagnosis Date Noted   Hyperglycemia 10/03/2014   BPH associated with nocturia 10/03/2014   Seasonal allergies 05/11/2014   PSA, INCREASED 04/16/2010   Gout 06/21/2009   Chronic kidney disease, stage III (moderate) (HCC) 04/05/2008   Hypertension 05/31/2007   Hyperlipidemia 05/24/2007   DEPRESSION 05/24/2007    PCP: Tana Conch, MD  REFERRING PROVIDER: Teryl Lucy, MD  REFERRING DIAG: cervical radiculopathy  THERAPY DIAG:  Radiculopathy, cervical region  Abnormal posture  Rationale for Evaluation and Treatment: Rehabilitation  ONSET DATE: 09/23/22  SUBJECTIVE:                                                                                                                                                                                                         SUBJECTIVE STATEMENT: I think this still helping, I am feeling stronger and less tingling 50-60% less. Gym 3 x a week and trying to duplicate ex from here PERTINENT HISTORY:  2-3 month history of B stocking/ glove like numbness B  hands, and runs lateral/post elbow , notices worse Sx with extending neck to put in eye drops Worse on L hand. Family doctor referred to orthopedist, orthopedist did x rays, and 2 shots, shots did not make much difference.  Spends 1 1/2 hrs at gym 3 x week, has been going to gym for 10 to 15 years.  PAIN:  Are you having pain? No  PRECAUTIONS: None  WEIGHT BEARING RESTRICTIONS:  No  FALLS:  Has patient fallen in last 6 months? 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, lifts the dog several times a week, married   PLOF: Independent  PATIENT GOALS: be able to get numbness to go away B hands  NEXT MD VISIT: unknown  OBJECTIVE:   DIAGNOSTIC FINDINGS:  Not in epic, patient reports orthopedist performed x rays c spine without remarkable findings  PATIENT SURVEYS:  NDI 5/50 or 10% disability  COGNITION: Overall cognitive status: Within functional limits for tasks assessed  SENSATION: Light touch: Impaired   POSTURE: rounded shoulders, forward head, and increased thoracic kyphosis  PALPATION: Tender B upper traps, L greater than R, also  L pec minor, coracobrachialis, pec major, ant scalenes.  Non tender B middle traps, levator Cervical side glides wnl Upper thoracic spine p/a mobility mild restriction   CERVICAL ROM:   Active ROM A/PROM (deg) eval  Flexion wfl  Extension Wfl, L lat tilt noted  Right lateral flexion 30  Left lateral flexion 30  Right rotation 80  Left rotation 80   (Blank rows = not tested)  UPPER EXTREMITY ROM: All B UE ROM wfl   UPPER EXTREMITY MMT:All MMT wfl , ex triceps L 4/5 , ER L 4/5 , intrinsics abd B hands 4-/5, grasp B 4/5   CERVICAL SPECIAL TESTS:  Upper limb tension test (ULTT): Negative   TODAY'S TREATMENT:                                                                                                                               DATE:   02/03/23  DN cerv MAlbright PT MMT and goals assessed and documented UBE L 3 2 min each way Cerv retraction with green tband 2 sets 10 Green tband tricep ext 10 x BIL ER 90/90 green tband 2 sets 10 Dips 2 sets 10 5# shld flex into abd 10 x then reverse 10 x 5# empty can 2 sets 10 4# PNF 2 sets 10 Green tband 3 pt scap stab 12 x each BIL Quadreped 4# UE various ex- cued to keep neutral spine Verb and  tactile cuing with all ex for correct tech    01/28/23: Manual: HVLA mobs PA T 4,5,6 Also cervicothoracic jxn B  dry needling B UT, B infraspinatus. Trigger Point Dry-Needling  Treatment instructions: Expect mild to moderate muscle soreness. S/S of pneumothorax if dry needled over a lung field, and to seek immediate medical attention should they occur. Patient verbalized understanding of these instructions and education. Patient Consent Given: Yes Education handout provided: No Muscles treated: B UT, B infraspin Electrical stimulation performed: No Parameters: N/A Treatment response/outcome: Twitch Response Elicited and Palpable Increase in Muscle Length Supine for bouts of intermittent manual cervical distraction and manual stretch for B upper  traps and B levator  Therex: 35# row 2x10 Lats 35# 2x10 15# straight arm pulls Pecs stretch in doorway Instructed in combined wrist ext and elbow ext stretch   01/22/23 UBE level 4 x 6 minutes 20# farmer carry 1 lap each arm, 10# both arms together overhead, then 6# carry single arms on lap each 20# chest press 35# row 2x10 Lats 35# 2x10 20# face pulls 15# straight arm pulls 25# sit to stand rows a lot of cues needed STM to the upper traps, rhomboids and cervical pspinals Occipital release, chest stretche  01/15/23 UBE level 4 x 6 mintues 25# face pulls 25# axe chops 6# single arm overhead carry one lap each 20# farmer carry 6# back  to wall overhead reach Green tband horizontal abduction Green tband ER DN to the upper traps into rhomboids STM to the upper traps and rhomboids  01/12/23 Nustep level 5 x 6 minutes Feet on ball K2C, rotation, bridge, iso abs HS stretch Standing 10# straight arm pulls, cues for posture and core DN to the upper traps and the rhomboids after consent good LTR's Thoracic PA and rotational glides Rib mobilizations STM to the rhomboids, upper traps and the neck Passive ROM of the cervical spine, occipital release   01/07/23:  Manual:  Supine for manual stretching alternating bouts of manual cervical distraction, upper traps, levator scapulae stretching, retraction at cervicothoracic jxn, also with intermittent bouts of contract/relax to achieve better muscular lengthening. Suboccipitals PA HVLA mobs T 3, 4, 6 in supine and prone  Therex:  Supine with 6" thoracic roll positioned horizontally overT 4- 6 region for specific localized T 4 to 6 PA self mob  Supine over 1/2 thoracic roll, for pec flys with 2# each hand to achieve dynamic stretching pectorals and ant chest wall, 15 x   01/05/23: Manual:  Trigger Point Dry-Needling  Treatment instructions: Expect mild to moderate muscle soreness. S/S of pneumothorax if dry needled over a lung field, and to seek immediate medical attention should they occur. Patient verbalized understanding of these instructions and education. Patient Consent Given: Yes Education handout provided: Previously provided Muscles treated: B upper traps, B pec major Electrical stimulation performed: No Parameters: N/A Treatment response/outcome: Twitch Response Elicited and Palpable Increase in Muscle Length  Supine for manual stretching alternating bouts of manual cervical distraction, upper traps, levator scapulae stretching, retraction at cervicothoracic jxn, also with intermittent bouts of contract/relax to achieve better muscular lengthening.   Kinesiotaping, 2 I  pieces, from thoracic spinous processes to medial border scapulae, applied with patient in rounded position  Therex: standing pulley stack, PNF pattern shoulder flex, add, Er to ext, abd, IR, 10 # each, 15 reps, cues to retract and depress scapula  Standing pulley stack pec flys, 10 # each, 2 sets 15 reps, cues to depress shoulders.  01/01/23 UBE 3 min fwd and back Cable Pulley shld ext 10# 2 sets 10 Cable Pulley shld row 15# 2 sets 10 Seated row,shld ext and horz abd 2 sets 10 6 #  3# 4 pt rhy stab 10 x each Head on ball on wall with retraction and 3# UE exercises ( shld flex,chest press, upright row and abd) Supine horz abd 10 x then diagnols 10 x each PROM and stretching cspine in supine with manual cerv traction  DN performed by Octavio Graves PT cerv/trap area    12/30/22 UBE L 5 2 min each way Seated row and Lat Pull Down 2 sets 12  45#- educ to do  at gym and cued to increase posture Flys and Chest press - again educ for gym and correct tech HEP issued and performed- with postural cuing needed as he tends to engage trap Reviewed Pec stretching as he doing more of lat stretch  DN performed by Octavio Graves PT cerv/trap area  Mech cerv traction 12 min   PATIENT EDUCATION:  Education details: POC, goals Person educated: Patient Education method: Explanation, Demonstration, Tactile cues, and Verbal cues Education comprehension: verbalized understanding, verbal cues required, tactile cues required, and needs further education  HOME EXERCISE PROGRAM:  12/30/22 Access Code: [email or web address] URL: [email or web address] Date: 12/30/2022 Prepared by: Marylene Land Nazanin Kinner  Exercises - Standing Shoulder Row with Anchored Resistance  - 1 x daily - 7 x weekly - 1 sets - 15 reps - Shoulder extension with resistance - Neutral  - 1 x daily - 7 x weekly - 1 sets - 15 reps - Standing Row with Resistance with Anchored Resistance at Chest Height Palms Down  - 1 x daily - 7 x weekly - 1 sets - 15  reps - Shoulder External Rotation and Scapular Retraction with Resistance  - 1 x daily - 7 x weekly - 1 sets - 15 reps - Standing Isometric Cervical Retraction with Chin Tucks and Ball at [employer]  - 1 x daily - 7 x weekly - 2-3 sets - 10 reps - 3 hold  ASSESSMENT:  CLINICAL IMPRESSION: Patient overall improved with decreased overall intensity of paresthesia B hands.  Still noting change in coloration, clammy in clinic with weight training Ue's .  Cuing with ex to decrease over use of traps Progressing with goals and moving fwd more with independent ex at gym  OBJECTIVE IMPAIRMENTS: decreased strength, increased fascial restrictions, increased muscle spasms, impaired sensation, impaired UE functional use, and postural dysfunction.   ACTIVITY LIMITATIONS: lifting and reach over head  PARTICIPATION LIMITATIONS: community activity and yard work  PERSONAL FACTORS: Behavior pattern, Fitness, Past/current experiences, and Time since onset of injury/illness/exacerbation are also affecting patient's functional outcome.   REHAB POTENTIAL: Good  CLINICAL DECISION MAKING: Stable/uncomplicated  EVALUATION COMPLEXITY: Low   GOALS: Goals reviewed with patient? Yes  SHORT TERM GOALS: Target date: 2 weeks 01/05/23  I HEP for management of Sx Baseline:  Goal status: progressing 12/30/22  02/03/23 MET   LONG TERM GOALS: Target date: 03/16/23  NDI improve from 10% to 5 % disability Baseline:  Goal status: progressing 01/12/23  2.  Increase strength L triceps, L shoulder ER to 5/5 Baseline: 4/5 Goal status: MET 02/03/23  3.  Improve parasthesia B hands by 50% with global improvement rating Baseline: constant Goal status: ongoing MET 02/03/23     PLAN:  PT FREQUENCY: 1-2x/week  PT DURATION: 12 weeks  PLANNED INTERVENTIONS: Therapeutic exercises, Therapeutic activity, Neuromuscular re-education, Balance training, Gait training, Patient/Family education, Self Care, and Joint mobilization  PLAN  FOR NEXT SESSION: utilize dry needling, stretching of pecs, upper traps, levator, strengthen ER's shoulders.   Lillianna Sabel,ANGIE, PTA 02/03/2023, 10:11 [employer] [facility]patient Rehabilitation at [facility] [address] [facility]. [place], [place], [address] Phone: [phone]   Fax:  [phone]  Patient Details  Name: Robert Gaines MRN: [id number] Date of Birth: 1946-07-20 Referring Provider:  Shelva Majestic, MD  Encounter Date: 02/03/2023   Suanne Marker, PTA 02/03/2023, 10:11 AM  [facility] [place] [facility] at [facility] [address] [facility]. [place], [place], [address] Phone: [phone]   Fax:  [phone]

## 2023-02-09 ENCOUNTER — Encounter: Payer: Self-pay | Admitting: Physical Therapy

## 2023-02-09 ENCOUNTER — Ambulatory Visit: Payer: Medicare PPO | Admitting: Physical Therapy

## 2023-02-09 DIAGNOSIS — R29898 Other symptoms and signs involving the musculoskeletal system: Secondary | ICD-10-CM | POA: Diagnosis not present

## 2023-02-09 DIAGNOSIS — M5412 Radiculopathy, cervical region: Secondary | ICD-10-CM

## 2023-02-09 DIAGNOSIS — R293 Abnormal posture: Secondary | ICD-10-CM

## 2023-02-09 NOTE — Therapy (Signed)
OUTPATIENT PHYSICAL THERAPY CERVICAL    Patient Name: Robert Gaines MRN: [id number] DOB:[birth year], 77 y.o., male Today's Date: 02/09/2023  END OF SESSION:  PT End of Session - 02/09/23 1348     Visit Number 11    Date for PT Re-Evaluation 03/16/23    PT Start Time 1348    PT Stop Time 1437    PT Time Calculation (min) 49 min    Activity Tolerance Patient tolerated treatment well    Behavior During Therapy [facility] for tasks assessed/performed              Past Medical History:  Diagnosis Date   BPH associated with nocturia 10/03/2014   Depression    Hyperlipidemia    Hypertension    PSA, INCREASED 04/16/2010   History prostatitis x 2, # decreased with ciprofloxacin     Rosacea    Past Surgical History:  Procedure Laterality Date   FLEXIBLE SIGMOIDOSCOPY     TONSILLECTOMY     at around 17-9 yrs old   WISDOM TOOTH EXTRACTION     Patient Active Problem List   Diagnosis Date Noted   Hyperglycemia 10/03/2014   BPH associated with nocturia 10/03/2014   Seasonal allergies 05/11/2014   PSA, INCREASED 04/16/2010   Gout 06/21/2009   Chronic kidney disease, stage III (moderate) (HCC) 04/05/2008   Hypertension 05/31/2007   Hyperlipidemia 05/24/2007   DEPRESSION 05/24/2007    PCP: Tana Conch, MD  REFERRING PROVIDER: Teryl Lucy, MD  REFERRING DIAG: cervical radiculopathy  THERAPY DIAG:  Radiculopathy, cervical region  Abnormal posture  Decreased grip strength  Rationale for Evaluation and Treatment: Rehabilitation  ONSET DATE: 09/23/22  SUBJECTIVE:                                                                                                                                                                                                         SUBJECTIVE STATEMENT: continues to have a little bit of numbness, reports that it is about 60% better overall but still feels it, he reports that he has tried to replicate some of the exercises from here to the gym  and has a few questions about them.   PERTINENT HISTORY:  2-3 month history of B stocking/ glove like numbness B hands, and runs lateral/post elbow , notices worse Sx with extending neck to put in eye drops Worse on L hand. Family doctor referred to orthopedist, orthopedist did x rays, and 2 shots, shots did not make much difference.  Spends 1 1/2 hrs at gym 3 x week, has been going  to gym for 10 to 15 years.  PAIN:  Are you having pain? Mild to no pain  PRECAUTIONS: None  WEIGHT BEARING RESTRICTIONS: No  FALLS:  Has patient fallen in last 6 months? 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, lifts the dog several times a week, married   PLOF: Independent  PATIENT GOALS: be able to get numbness to go away B hands  NEXT MD VISIT: unknown  OBJECTIVE:   DIAGNOSTIC FINDINGS:  Not in epic, patient reports orthopedist performed x rays c spine without remarkable findings  PATIENT SURVEYS:  NDI 5/50 or 10% disability  COGNITION: Overall cognitive status: Within functional limits for tasks assessed  SENSATION: Light touch: Impaired   POSTURE: rounded shoulders, forward head, and increased thoracic kyphosis  PALPATION: Tender B upper traps, L greater than R, also  L pec minor, coracobrachialis, pec major, ant scalenes.  Non tender B middle traps, levator Cervical side glides wnl Upper thoracic spine p/a mobility mild restriction   CERVICAL ROM:   Active ROM A/PROM (deg) eval  Flexion wfl  Extension Wfl, L lat tilt noted  Right lateral flexion 30  Left lateral flexion 30  Right rotation 80  Left rotation 80   (Blank rows = not tested)  UPPER EXTREMITY ROM: All B UE ROM wfl   UPPER EXTREMITY MMT:All MMT wfl , ex triceps L 4/5 , ER L 4/5 , intrinsics abd B hands 4-/5, grasp B  4/5   CERVICAL SPECIAL TESTS:  Upper limb tension test (ULTT): Negative   TODAY'S TREATMENT:                                                                                                                              DATE:  02/09/23 Grip strength right 70# , 60# left Left shoulder is still showing ER 4/5 and abduction 4/5 Reviewed HEP and gym program that he is going to try on his own, went over posture, body mechanics and form, he feels that he has a good understanding of this and how to do. Passive stretch of the pectorals, the wrists and the upper trap and levator area.  Occipital release and manual cervical traction  02/03/23  DN cerv MAlbright PT MMT and goals assessed and documented UBE L 3 2 min each way Cerv retraction with green tband 2 sets 10 Green tband tricep ext 10 x BIL ER 90/90 green tband 2 sets 10 Dips 2 sets 10 5# shld flex into abd 10 x then reverse 10 x 5# empty can 2 sets 10 4# PNF 2 sets 10 Green tband 3 pt scap stab 12 x each BIL Quadreped 4# UE various ex- cued to keep neutral spine Verb and  tactile cuing with all ex for correct tech    01/28/23: Manual: HVLA  mobs PA T 4,5,6 Also cervicothoracic jxn B  dry needling B UT, B infraspinatus. Trigger Point Dry-Needling  Treatment instructions: Expect mild to moderate muscle soreness. S/S of pneumothorax if dry needled over a lung field, and to seek immediate medical attention should they occur. Patient verbalized understanding of these instructions and education. Patient Consent Given: Yes Education handout provided: No Muscles treated: B UT, B infraspin Electrical stimulation performed: No Parameters: N/A Treatment response/outcome: Twitch Response Elicited and Palpable Increase in Muscle Length Supine for bouts of intermittent manual cervical distraction and manual stretch for B upper traps and B levator  Therex: 35# row 2x10 Lats 35# 2x10 15# straight arm pulls Pecs stretch in  doorway Instructed in combined wrist ext and elbow ext stretch   01/22/23 UBE level 4 x 6 minutes 20# farmer carry 1 lap each arm, 10# both arms together overhead, then 6# carry single arms on lap each 20# chest press 35# row 2x10 Lats 35# 2x10 20# face pulls 15# straight arm pulls 25# sit to stand rows a lot of cues needed STM to the upper traps, rhomboids and cervical pspinals Occipital release, chest stretche  01/15/23 UBE level 4 x 6 mintues 25# face pulls 25# axe chops 6# single arm overhead carry one lap each 20# farmer carry 6# back to wall overhead reach Green tband horizontal abduction Green tband ER DN to the upper traps into rhomboids STM to the upper traps and rhomboids  01/12/23 Nustep level 5 x 6 minutes Feet on ball K2C, rotation, bridge, iso abs HS stretch Standing 10# straight arm pulls, cues for posture and core DN to the upper traps and the rhomboids after consent good LTR's Thoracic PA and rotational glides Rib mobilizations STM to the rhomboids, upper traps and the neck Passive ROM of the cervical spine, occipital release  PATIENT EDUCATION:  Education details: POC, goals Person educated: Patient Education method: Explanation, Demonstration, Tactile cues, and Verbal cues Education comprehension: verbalized understanding, verbal cues required, tactile cues required, and needs further education  HOME EXERCISE PROGRAM:  12/30/22 Access Code: [email or web address] URL: [email or web address] Date: 12/30/2022 Prepared by: Marylene Land Payseur  Exercises - Standing Shoulder Row with Anchored Resistance  - 1 x daily - 7 x weekly - 1 sets - 15 reps - Shoulder extension with resistance - Neutral  - 1 x daily - 7 x weekly - 1 sets - 15 reps - Standing Row with Resistance with Anchored Resistance at Chest Height Palms Down  - 1 x daily - 7 x weekly - 1 sets - 15 reps - Shoulder External Rotation and Scapular Retraction with Resistance  - 1 x daily - 7 x weekly -  1 sets - 15 reps - Standing Isometric Cervical Retraction with Chin Tucks and Ball at [employer]  - 1 x daily - 7 x weekly - 2-3 sets - 10 reps - 3 hold  ASSESSMENT:  CLINICAL IMPRESSION: Patient overall improved with decreased overall intensity of paresthesia B hands.  Still noting change in coloration, clammy in clinic with weight training Ue's .  He is overall doing better with his strength and function, we are thinking he is going to try to do the gym as we reviewed the form, weights and what not to do.  He continues to have the tingling and some weakness especially in the left UE.  I would like to see how he does on his own over the next 4 weeks and then bring him back and assess his progress and how  he is doing, he does have tension and tenderness in the upper traps, and the rhomboids, tends to elevate shoulders with exercises and will havce forward head posture  OBJECTIVE IMPAIRMENTS: decreased strength, increased fascial restrictions, increased muscle spasms, impaired sensation, impaired UE functional use, and postural dysfunction.   ACTIVITY LIMITATIONS: lifting and reach over head  PARTICIPATION LIMITATIONS: community activity and yard work  PERSONAL FACTORS: Behavior pattern, Fitness, Past/current experiences, and Time since onset of injury/illness/exacerbation are also affecting patient's functional outcome.   REHAB POTENTIAL: Good  CLINICAL DECISION MAKING: Stable/uncomplicated  EVALUATION COMPLEXITY: Low   GOALS: Goals reviewed with patient? Yes  SHORT TERM GOALS: Target date: 2 weeks 01/05/23  I HEP for management of Sx Baseline:  Goal status: progressing 12/30/22  02/03/23 MET   LONG TERM GOALS: Target date: 03/16/23  NDI improve from 10% to 5 % disability Baseline:  Goal status: progressing 01/12/23  2.  Increase strength L triceps, L shoulder ER to 5/5 Baseline: 4/5 Goal status: MET 02/03/23  3.  Improve parasthesia B hands by 50% with global improvement  rating Baseline: constant Goal status: ongoing MET 02/03/23     4. Independent with advanced gym program    Goal status, will see how he can do onown over the next 4 weeks  PLAN:  PT FREQUENCY: 1-2x/week  PT DURATION: 12 weeks  PLANNED INTERVENTIONS: Therapeutic exercises, Therapeutic activity, Neuromuscular re-education, Balance training, Gait training, Patient/Family education, Self Care, and Joint mobilization  PLAN FOR NEXT SESSION: Will request more visits but put them out about 4 weeks to see if he can do advanced HEP on is own, assure his safety and form and if the hands get better.  Jearld Lesch, PT 02/09/2023, 1:49 [facility] Health Outpatient Rehabilitation at [facility] [address] [facility]. [place], [place], [address] Phone: [phone]   Fax:  [phone]

## 2023-03-24 ENCOUNTER — Ambulatory Visit (INDEPENDENT_AMBULATORY_CARE_PROVIDER_SITE_OTHER): Payer: Medicare PPO

## 2023-03-24 VITALS — Wt 161.0 lb

## 2023-03-24 DIAGNOSIS — Z0000 Encounter for general adult medical examination without abnormal findings: Secondary | ICD-10-CM

## 2023-03-24 NOTE — Patient Instructions (Signed)
[name] , Thank you for taking time to come for your Medicare Wellness Visit. I appreciate your ongoing commitment to your health goals. Please review the following plan we discussed and let me know if I can assist you in the future.   Referrals/Orders/Follow-Ups/Clinician Recommendations: continue exercise   This is a list of the screening recommended for you and due dates:  Health Maintenance  Topic Date Due   Zoster (Shingles) Vaccine (1 of 2) Never done   COVID-19 Vaccine (4 - 2023-24 season) 04/25/2022   Flu Shot  03/26/2023   Colon Cancer Screening  11/03/2023   Medicare Annual Wellness Visit  03/23/2024   DTaP/Tdap/Td vaccine (4 - Td or Tdap) 04/16/2029   Pneumonia Vaccine  Completed   Hepatitis C Screening  Completed   HPV Vaccine  Aged Out    Advanced directives: (Copy Requested) Please bring a copy of your health care power of attorney and living will to the office to be added to your chart at your convenience.  Next Medicare Annual Wellness Visit scheduled for next year: Yes  Preventive Care 16 Years and Older, Male  Preventive care refers to lifestyle choices and visits with your health care provider that can promote health and wellness. What does preventive care include? A yearly physical exam. This is also called an annual well check. Dental exams once or twice a year. Routine eye exams. Ask your health care provider how often you should have your eyes checked. Personal lifestyle choices, including: Daily care of your teeth and gums. Regular physical activity. Eating a healthy diet. Avoiding tobacco and drug use. Limiting alcohol use. Practicing safe sex. Taking low doses of aspirin every day. Taking vitamin and mineral supplements as recommended by your health care provider. What happens during an annual well check? The services and screenings done by your health care provider during your annual well check will depend on your age, overall health, lifestyle  risk factors, and family history of disease. Counseling  Your health care provider may ask you questions about your: Alcohol use. Tobacco use. Drug use. Emotional well-being. Home and relationship well-being. Sexual activity. Eating habits. History of falls. Memory and ability to understand (cognition). Work and work Astronomer. Screening  You may have the following tests or measurements: Height, weight, and BMI. Blood pressure. Lipid and cholesterol levels. These may be checked every 5 years, or more frequently if you are over 4 years old. Skin check. Lung cancer screening. You may have this screening every year starting at age 10 if you have a 30-pack-year history of smoking and currently smoke or have quit within the past 15 years. Fecal occult blood test (FOBT) of the stool. You may have this test every year starting at age 11. Flexible sigmoidoscopy or colonoscopy. You may have a sigmoidoscopy every 5 years or a colonoscopy every 10 years starting at age 4. Prostate cancer screening. Recommendations will vary depending on your family history and other risks. Hepatitis C blood test. Hepatitis B blood test. Sexually transmitted disease (STD) testing. Diabetes screening. This is done by checking your blood sugar (glucose) after you have not eaten for a while (fasting). You may have this done every 1-3 years. Abdominal aortic aneurysm (AAA) screening. You may need this if you are a current or former smoker. Osteoporosis. You may be screened starting at age 86 if you are at high risk. Talk with your health care provider about your test results, treatment options, and if necessary, the need for more tests.  Vaccines  Your health care provider may recommend certain vaccines, such as: Influenza vaccine. This is recommended every year. Tetanus, diphtheria, and acellular pertussis (Tdap, Td) vaccine. You may need a Td booster every 10 years. Zoster vaccine. You may need this after age  53. Pneumococcal 13-valent conjugate (PCV13) vaccine. One dose is recommended after age 16. Pneumococcal polysaccharide (PPSV23) vaccine. One dose is recommended after age 69. Talk to your health care provider about which screenings and vaccines you need and how often you need them. This information is not intended to replace advice given to you by your health care provider. Make sure you discuss any questions you have with your health care provider. Document Released: 09/07/2015 Document Revised: 04/30/2016 Document Reviewed: 06/12/2015 Elsevier Interactive Patient Education  2017 [employer].  Fall Prevention in the Home Falls can cause injuries. They can happen to people of all ages. There are many things you can do to make your home safe and to help prevent falls. What can I do on the outside of my home? Regularly fix the edges of walkways and driveways and fix any cracks. Remove anything that might make you trip as you walk through a door, such as a raised step or threshold. Trim any bushes or trees on the path to your home. Use bright outdoor lighting. Clear any walking paths of anything that might make someone trip, such as rocks or tools. Regularly check to see if handrails are loose or broken. Make sure that both sides of any steps have handrails. Any raised decks and porches should have guardrails on the edges. Have any leaves, snow, or ice cleared regularly. Use sand or salt on walking paths during winter. Clean up any spills in your garage right away. This includes oil or grease spills. What can I do in the bathroom? Use night lights. Install grab bars by the toilet and in the tub and shower. Do not use towel bars as grab bars. Use non-skid mats or decals in the tub or shower. If you need to sit down in the shower, use a plastic, non-slip stool. Keep the floor dry. Clean up any water that spills on the floor as soon as it happens. Remove soap buildup in the tub or shower  regularly. Attach bath mats securely with double-sided non-slip rug tape. Do not have throw rugs and other things on the floor that can make you trip. What can I do in the bedroom? Use night lights. Make sure that you have a light by your bed that is easy to reach. Do not use any sheets or blankets that are too big for your bed. They should not hang down onto the floor. Have a firm chair that has side arms. You can use this for support while you get dressed. Do not have throw rugs and other things on the floor that can make you trip. What can I do in the kitchen? Clean up any spills right away. Avoid walking on wet floors. Keep items that you use a lot in easy-to-reach places. If you need to reach something above you, use a strong step stool that has a grab bar. Keep electrical cords out of the way. Do not use floor polish or wax that makes floors slippery. If you must use wax, use non-skid floor wax. Do not have throw rugs and other things on the floor that can make you trip. What can I do with my stairs? Do not leave any items on the stairs. Make sure that  there are handrails on both sides of the stairs and use them. Fix handrails that are broken or loose. Make sure that handrails are as long as the stairways. Check any carpeting to make sure that it is firmly attached to the stairs. Fix any carpet that is loose or worn. Avoid having throw rugs at the top or bottom of the stairs. If you do have throw rugs, attach them to the floor with carpet tape. Make sure that you have a light switch at the top of the stairs and the bottom of the stairs. If you do not have them, ask someone to add them for you. What else can I do to help prevent falls? Wear shoes that: Do not have high heels. Have rubber bottoms. Are comfortable and fit you well. Are closed at the toe. Do not wear sandals. If you use a stepladder: Make sure that it is fully opened. Do not climb a closed stepladder. Make sure that  both sides of the stepladder are locked into place. Ask someone to hold it for you, if possible. Clearly mark and make sure that you can see: Any grab bars or handrails. First and last steps. Where the edge of each step is. Use tools that help you move around (mobility aids) if they are needed. These include: Canes. Walkers. Scooters. Crutches. Turn on the lights when you go into a dark area. Replace any light bulbs as soon as they burn out. Set up your furniture so you have a clear path. Avoid moving your furniture around. If any of your floors are uneven, fix them. If there are any pets around you, be aware of where they are. Review your medicines with your doctor. Some medicines can make you feel dizzy. This can increase your chance of falling. Ask your doctor what other things that you can do to help prevent falls. This information is not intended to replace advice given to you by your health care provider. Make sure you discuss any questions you have with your health care provider. Document Released: 06/07/2009 Document Revised: 01/17/2016 Document Reviewed: 09/15/2014 Elsevier Interactive Patient Education  2017 [employer].

## 2023-03-24 NOTE — Progress Notes (Signed)
Subjective:   Robert Gaines is a 77 y.o. male who presents for Medicare Annual/Subsequent preventive examination.  Visit Complete: Virtual  I connected with  Robert Gaines on 03/24/23 by a audio enabled telemedicine application and verified that I am speaking with the correct person using two identifiers.  Patient Location: Home  Provider Location: Office/Clinic  I discussed the limitations of evaluation and management by telemedicine. The patient expressed understanding and agreed to proceed.  Patient Medicare AWV questionnaire was completed by the patient on 03/24/23; I have confirmed that all information answered by patient is correct and no changes since this date.  Review of Systems     Cardiac Risk Factors include: advanced age (>59men, >40 women);dyslipidemia;male gender     Objective:    Today's Vitals   03/24/23 1444  Weight: 161 lb (73 kg)   Body mass index is 26.79 kg/m.     03/24/2023    2:47 PM 12/22/2022    9:35 AM 04/04/2022    9:22 AM 02/23/2020   12:27 PM 11/29/2015    8:58 AM 11/20/2015    6:07 PM  Advanced Directives  Does Patient Have a Medical Advance Directive? Yes Yes Yes Yes No No  Type of Estate agent of [place];Living will Healthcare Power of [employer] Power of Attorney Living will;Healthcare Power of Attorney    Does patient want to make changes to medical advance directive?  No - Patient declined      Copy of Healthcare Power of Attorney in Chart? No - copy requested  No - copy requested No - copy requested    Would patient like information on creating a medical advance directive?     No - patient declined information No - patient declined information    Current Medications (verified) Outpatient Encounter Medications as of 03/24/2023  Medication Sig   allopurinol (ZYLOPRIM) 100 MG tablet TAKE 2 TABLETS BY MOUTH EVERY DAY   aspirin 81 MG tablet Take 81 mg by mouth daily.   atorvastatin (LIPITOR) 40 MG tablet TAKE  1 TABLET BY MOUTH EVERY DAY   Biotin 5000 MCG TABS Take 1 tablet by mouth.   Calcium Carbonate-Vitamin D (CALCIUM-VITAMIN D) 500-200 MG-UNIT tablet Take 1 tablet by mouth daily.   cyanocobalamin 1000 MCG tablet Take 1,000 mcg by mouth daily.   ferrous sulfate 325 (65 FE) MG tablet Take 325 mg by mouth daily with breakfast.   glucosamine-chondroitin 500-400 MG tablet Take 1 tablet by mouth 3 (three) times daily.   lisinopril (ZESTRIL) 5 MG tablet TAKE 1 TABLET BY MOUTH EVERY DAY   Multiple Vitamins-Minerals (CENTRUM SILVER PO) Take 1 tablet by mouth daily.    No facility-administered encounter medications on file as of 03/24/2023.    Allergies (verified) Patient has no known allergies.   History: Past Medical History:  Diagnosis Date   BPH associated with nocturia 10/03/2014   Depression    Hyperlipidemia    Hypertension    PSA, INCREASED 04/16/2010   History prostatitis x 2, # decreased with ciprofloxacin     Rosacea    Past Surgical History:  Procedure Laterality Date   FLEXIBLE SIGMOIDOSCOPY     TONSILLECTOMY     at around 54-9 yrs old   WISDOM TOOTH EXTRACTION     Family History  Problem Relation Age of Onset   Heart disease Father        MI 70, smoker   Colon cancer Neg Hx    Esophageal cancer Neg  Hx    Rectal cancer Neg Hx    Stomach cancer Neg Hx    Social History   Socioeconomic History   Marital status: Married    Spouse name: Not on file   Number of children: Not on file   Years of education: Not on file   Highest education level: Not on file  Occupational History   Occupation: Retired  Tobacco Use   Smoking status: Never   Smokeless tobacco: Never  Substance and Sexual Activity   Alcohol use: Yes    Comment: occas   Drug use: No   Sexual activity: Not on file  Other Topics Concern   Not on file  Social History Narrative   Married 1978. Son from first marriage [name] (1971-psychiatrist in [place]). No grandkids.    2 dogs-sheltie/collie mix and  border collie mix      Retired from Special educational needs teacher      Hobbies: time with dogs, exercising   Social Determinants of Health   Financial Resource Strain: Low Risk  (03/20/2023)   Overall Financial Resource Strain (CARDIA)    Difficulty of Paying Living Expenses: Not very hard  Food Insecurity: No Food Insecurity (03/20/2023)   Hunger Vital Sign    Worried About Running Out of Food in the Last Year: Never true    Ran Out of Food in the Last Year: Never true  Transportation Needs: No Transportation Needs (03/20/2023)   PRAPARE - Administrator, Civil Service (Medical): No    Lack of Transportation (Non-Medical): No  Physical Activity: Sufficiently Active (03/20/2023)   Exercise Vital Sign    Days of Exercise per Week: 3 days    Minutes of Exercise per Session: 90 min  Stress: No Stress Concern Present (03/20/2023)   [employer] of Occupational Health - Occupational Stress Questionnaire    Feeling of Stress : Not at all  Social Connections: Moderately Isolated (03/20/2023)   Social Connection and Isolation Panel [NHANES]    Frequency of Communication with Friends and Family: Twice a week    Frequency of Social Gatherings with Friends and Family: More than three times a week    Attends Religious Services: Never    Database administrator or Organizations: No    Attends Engineer, structural: Never    Marital Status: Married    Tobacco Counseling Counseling given: Not Answered   Clinical Intake:     Pain : No/denies pain     BMI - recorded: 26.79 Nutritional Status: BMI 25 -29 Overweight Nutritional Risks: None Diabetes: No  How often do you need to have someone help you when you read instructions, pamphlets, or other written materials from your doctor or pharmacy?: 1 - Never  Interpreter Needed?: No  Information entered by :: Lanier Ensign, LPN   Activities of Daily Living    03/20/2023    9:54 AM 04/04/2022    9:23 AM  In  your present state of health, do you have any difficulty performing the following activities:  Hearing? 0 0  Vision? 0 0  Difficulty concentrating or making decisions? 0 0  Walking or climbing stairs? 0 0  Dressing or bathing? 0 0  Doing errands, shopping? 0 0  Preparing Food and eating ? N N  Using the Toilet? N N  In the past six months, have you accidently leaked urine? N N  Do you have problems with loss of bowel control? N N  Managing your Medications? N  N  Managing your Finances? N N  Housekeeping or managing your Housekeeping? N N    Patient Care Team: Shelva Majestic, MD as PCP - General (Family Medicine)  Indicate any recent Medical Services you may have received from other than [facility] providers in the past year (date may be approximate).     Assessment:   This is a routine wellness examination for [employer].  Hearing/Vision screen Hearing Screening - Comments:: Pt denies any hearing issues  Vision Screening - Comments:: Pt follows up wit  Dr Nile Riggs and next year will be with Dr Dione Booze   Dietary issues and exercise activities discussed:     Goals Addressed             This Visit's Progress    Patient Stated       Continue exercise        Depression Screen    03/24/2023    2:48 PM 04/04/2022    9:21 AM 10/02/2021    8:21 AM 09/13/2020    9:09 AM 02/23/2020   12:24 PM 02/21/2020    8:14 AM 01/20/2019    8:20 AM  PHQ 2/9 Scores  PHQ - 2 Score 0 0 0 0 0 0 0  PHQ- 9 Score   0  0 0 0    Fall Risk    03/20/2023    9:54 AM 04/04/2022    9:23 AM 10/02/2021    8:22 AM 03/28/2021    8:13 AM 09/13/2020    9:08 AM  Fall Risk   Falls in the past year? 0 0 0 0 0  Number falls in past yr: 0 0 0  0  Injury with Fall? 0 0 0  0  Risk for fall due to : Impaired vision  No Fall Risks    Follow up Falls prevention discussed Falls prevention discussed       MEDICARE RISK AT HOME:   TIMED UP AND GO:  Was the test performed?  No    Cognitive Function:         03/24/2023    2:51 PM 04/04/2022    9:24 AM 02/23/2020   12:34 PM  6CIT Screen  What Year? 0 points 0 points 0 points  What month? 0 points 0 points 0 points  What time? 0 points 0 points 0 points  Count back from 20 0 points 0 points 0 points  Months in reverse 0 points 0 points 0 points  Repeat phrase 0 points 0 points   Total Score 0 points 0 points     Immunizations Immunization History  Administered Date(s) Administered   Influenza Split 06/05/2011, 05/06/2012   Influenza Whole 08/25/2000, 05/31/2007, 05/17/2009, 06/06/2010   Influenza, High Dose Seasonal PF 05/22/2016, 06/08/2017, 07/03/2018, 04/19/2019, 06/18/2020   Influenza,inj,Quad PF,6+ Mos 08/09/2013, 05/11/2014, 05/24/2015   Influenza-Unspecified 07/05/2017, 04/19/2019   PFIZER(Purple Top)SARS-COV-2 Vaccination 10/16/2019, 11/09/2019, 07/09/2020   Pneumococcal Conjugate-13 10/09/2015   Pneumococcal Polysaccharide-23 05/11/2014   Td 06/25/1998, 04/05/2008   Tdap 04/17/2019    TDAP status: Up to date  Flu Vaccine status: Declined, Education has been provided regarding the importance of this vaccine but patient still declined. Advised may receive this vaccine at local pharmacy or Health Dept. Aware to provide a copy of the vaccination record if obtained from local pharmacy or Health Dept. Verbalized acceptance and understanding.  Pneumococcal vaccine status: Up to date  Covid-19 vaccine status: Information provided on how to obtain vaccines.   Qualifies for Shingles Vaccine? Yes  Zostavax completed No   Shingrix Completed?: No.    Education has been provided regarding the importance of this vaccine. Patient has been advised to call insurance company to determine out of pocket expense if they have not yet received this vaccine. Advised may also receive vaccine at local pharmacy or Health Dept. Verbalized acceptance and understanding.  Screening Tests Health Maintenance  Topic Date Due   Zoster Vaccines- Shingrix (1  of 2) Never done   COVID-19 Vaccine (4 - 2023-24 season) 04/25/2022   INFLUENZA VACCINE  03/26/2023   Colonoscopy  11/03/2023   Medicare Annual Wellness (AWV)  03/23/2024   DTaP/Tdap/Td (4 - Td or Tdap) 04/16/2029   Pneumonia Vaccine 69+ Years old  Completed   Hepatitis C Screening  Completed   HPV VACCINES  Aged Out    Health Maintenance  Health Maintenance Due  Topic Date Due   Zoster Vaccines- Shingrix (1 of 2) Never done   COVID-19 Vaccine (4 - 2023-24 season) 04/25/2022    Colorectal cancer screening: Type of screening: Colonoscopy. Completed 11/02/13. Repeat every 10 years  Additional Screening:  Hepatitis C Screening:  Completed 01/10/16  Vision Screening: Recommended annual ophthalmology exams for early detection of glaucoma and other disorders of the eye. Is the patient up to date with their annual eye exam?  Yes  Who is the provider or what is the name of the office in which the patient attends annual eye exams? Dr Nile Riggs / will be Dr Dione Booze for follow up  If pt is not established with a provider, would they like to be referred to a provider to establish care? No .   Dental Screening: Recommended annual dental exams for proper oral hygiene   Community Resource Referral / Chronic Care Management: CRR required this visit?  No   CCM required this visit?  No     Plan:     I have personally reviewed and noted the following in the patient's chart:   Medical and social history Use of alcohol, tobacco or illicit drugs  Current medications and supplements including opioid prescriptions. Patient is not currently taking opioid prescriptions. Functional ability and status Nutritional status Physical activity Advanced directives List of other physicians Hospitalizations, surgeries, and ER visits in previous 12 months Vitals Screenings to include cognitive, depression, and falls Referrals and appointments  In addition, I have reviewed and discussed with patient  certain preventive protocols, quality metrics, and best practice recommendations. A written personalized care plan for preventive services as well as general preventive health recommendations were provided to patient.     [name], LPN   [id number]   After Visit Summary: (MyChart) Due to this being a telephonic visit, the after visit summary with patients personalized plan was offered to patient via MyChart   Nurse Notes: none

## 2023-03-25 ENCOUNTER — Encounter (INDEPENDENT_AMBULATORY_CARE_PROVIDER_SITE_OTHER): Payer: Self-pay

## 2023-03-31 NOTE — Progress Notes (Signed)
Subjective:   Robert Gaines is a 76 y.o. male who presents for Medicare Annual/Subsequent preventive examination.  Visit Complete: Virtual  I connected with  Robert Gaines on 03/31/23 by a audio enabled telemedicine application and verified that I am speaking with the correct person using two identifiers.  Patient Location: Home  Provider Location: Office/Clinic  I discussed the limitations of evaluation and management by telemedicine. The patient expressed understanding and agreed to proceed.  Patient Medicare AWV questionnaire was completed by the patient on 03/24/23; I have confirmed that all information answered by patient is correct and no changes since this date.  Vital Signs: Unable to obtain new vitals due to this being a telehealth visit.   Review of Systems     Cardiac Risk Factors include: advanced age (>31men, >86 women);dyslipidemia;male gender     Objective:    Today's Vitals   03/24/23 1444  Weight: 161 lb (73 kg)   Body mass index is 26.79 kg/m.     03/24/2023    2:47 PM 12/22/2022    9:35 AM 04/04/2022    9:22 AM 02/23/2020   12:27 PM 11/29/2015    8:58 AM 11/20/2015    6:07 PM  Advanced Directives  Does Patient Have a Medical Advance Directive? Yes Yes Yes Yes No No  Type of Estate agent of [place];Living will Healthcare Power of [employer] Power of Attorney Living will;Healthcare Power of Attorney    Does patient want to make changes to medical advance directive?  No - Patient declined      Copy of Healthcare Power of Attorney in Chart? No - copy requested  No - copy requested No - copy requested    Would patient like information on creating a medical advance directive?     No - patient declined information No - patient declined information    Current Medications (verified) Outpatient Encounter Medications as of 03/24/2023  Medication Sig   allopurinol (ZYLOPRIM) 100 MG tablet TAKE 2 TABLETS BY MOUTH EVERY DAY   aspirin  81 MG tablet Take 81 mg by mouth daily.   atorvastatin (LIPITOR) 40 MG tablet TAKE 1 TABLET BY MOUTH EVERY DAY   Biotin 5000 MCG TABS Take 1 tablet by mouth.   Calcium Carbonate-Vitamin D (CALCIUM-VITAMIN D) 500-200 MG-UNIT tablet Take 1 tablet by mouth daily.   cyanocobalamin 1000 MCG tablet Take 1,000 mcg by mouth daily.   ferrous sulfate 325 (65 FE) MG tablet Take 325 mg by mouth daily with breakfast.   glucosamine-chondroitin 500-400 MG tablet Take 1 tablet by mouth 3 (three) times daily.   lisinopril (ZESTRIL) 5 MG tablet TAKE 1 TABLET BY MOUTH EVERY DAY   Multiple Vitamins-Minerals (CENTRUM SILVER PO) Take 1 tablet by mouth daily.    No facility-administered encounter medications on file as of 03/24/2023.    Allergies (verified) Patient has no known allergies.   History: Past Medical History:  Diagnosis Date   BPH associated with nocturia 10/03/2014   Depression    Hyperlipidemia    Hypertension    PSA, INCREASED 04/16/2010   History prostatitis x 2, # decreased with ciprofloxacin     Rosacea    Past Surgical History:  Procedure Laterality Date   FLEXIBLE SIGMOIDOSCOPY     TONSILLECTOMY     at around 71-10 yrs old   WISDOM TOOTH EXTRACTION     Family History  Problem Relation Age of Onset   Heart disease Father  MI 37, smoker   Colon cancer Neg Hx    Esophageal cancer Neg Hx    Rectal cancer Neg Hx    Stomach cancer Neg Hx    Social History   Socioeconomic History   Marital status: Married    Spouse name: Not on file   Number of children: Not on file   Years of education: Not on file   Highest education level: Not on file  Occupational History   Occupation: Retired  Tobacco Use   Smoking status: Never   Smokeless tobacco: Never  Substance and Sexual Activity   Alcohol use: Yes    Comment: occas   Drug use: No   Sexual activity: Not on file  Other Topics Concern   Not on file  Social History Narrative   Married 1978. Son from first marriage [name] (1971-psychiatrist in [place]). No grandkids.    2 dogs-sheltie/collie mix and border collie mix      Retired from Special educational needs teacher      Hobbies: time with dogs, exercising   Social Determinants of Health   Financial Resource Strain: Low Risk  (03/20/2023)   Overall Financial Resource Strain (CARDIA)    Difficulty of Paying Living Expenses: Not very hard  Food Insecurity: No Food Insecurity (03/20/2023)   Hunger Vital Sign    Worried About Running Out of Food in the Last Year: Never true    Ran Out of Food in the Last Year: Never true  Transportation Needs: No Transportation Needs (03/20/2023)   PRAPARE - Administrator, Civil Service (Medical): No    Lack of Transportation (Non-Medical): No  Physical Activity: Sufficiently Active (03/20/2023)   Exercise Vital Sign    Days of Exercise per Week: 3 days    Minutes of Exercise per Session: 90 min  Stress: No Stress Concern Present (03/20/2023)   [employer] of Occupational Health - Occupational Stress Questionnaire    Feeling of Stress : Not at all  Social Connections: Moderately Isolated (03/20/2023)   Social Connection and Isolation Panel [NHANES]    Frequency of Communication with Friends and Family: Twice a week    Frequency of Social Gatherings with Friends and Family: More than three times a week    Attends Religious Services: Never    Database administrator or Organizations: No    Attends Engineer, structural: Never    Marital Status: Married    Tobacco Counseling Counseling given: Not Answered   Clinical Intake:     Pain : No/denies pain     BMI - recorded: 26.79 Nutritional Status: BMI 25 -29 Overweight Nutritional Risks: None Diabetes: No  How often do you need to have someone help you when you read instructions, pamphlets, or other written materials from your doctor or pharmacy?: 1 - Never  Interpreter Needed?: No  Information entered by :: Lanier Ensign,  LPN   Activities of Daily Living    03/20/2023    9:54 AM 04/04/2022    9:23 AM  In your present state of health, do you have any difficulty performing the following activities:  Hearing? 0 0  Vision? 0 0  Difficulty concentrating or making decisions? 0 0  Walking or climbing stairs? 0 0  Dressing or bathing? 0 0  Doing errands, shopping? 0 0  Preparing Food and eating ? N N  Using the Toilet? N N  In the past six months, have you accidently leaked urine? N N  Do  you have problems with loss of bowel control? N N  Managing your Medications? N N  Managing your Finances? N N  Housekeeping or managing your Housekeeping? N N    Patient Care Team: Shelva Majestic, MD as PCP - General (Family Medicine)  Indicate any recent Medical Services you may have received from other than [facility] providers in the past year (date may be approximate).     Assessment:   This is a routine wellness examination for [employer].  Hearing/Vision screen Hearing Screening - Comments:: Pt denies any hearing issues  Vision Screening - Comments:: Pt follows up wit  Dr Nile Riggs and next year will be with Dr Dione Booze   Dietary issues and exercise activities discussed:     Goals Addressed             This Visit's Progress    Patient Stated       Continue exercise        Depression Screen    03/24/2023    2:48 PM 04/04/2022    9:21 AM 10/02/2021    8:21 AM 09/13/2020    9:09 AM 02/23/2020   12:24 PM 02/21/2020    8:14 AM 01/20/2019    8:20 AM  PHQ 2/9 Scores  PHQ - 2 Score 0 0 0 0 0 0 0  PHQ- 9 Score   0  0 0 0    Fall Risk    03/20/2023    9:54 AM 04/04/2022    9:23 AM 10/02/2021    8:22 AM 03/28/2021    8:13 AM 09/13/2020    9:08 AM  Fall Risk   Falls in the past year? 0 0 0 0 0  Number falls in past yr: 0 0 0  0  Injury with Fall? 0 0 0  0  Risk for fall due to : Impaired vision  No Fall Risks    Follow up Falls prevention discussed Falls prevention discussed       MEDICARE RISK AT  HOME:   TIMED UP AND GO:  Was the test performed?  No    Cognitive Function:        03/24/2023    2:51 PM 04/04/2022    9:24 AM 02/23/2020   12:34 PM  6CIT Screen  What Year? 0 points 0 points 0 points  What month? 0 points 0 points 0 points  What time? 0 points 0 points 0 points  Count back from 20 0 points 0 points 0 points  Months in reverse 0 points 0 points 0 points  Repeat phrase 0 points 0 points   Total Score 0 points 0 points     Immunizations Immunization History  Administered Date(s) Administered   Influenza Split 06/05/2011, 05/06/2012   Influenza Whole 08/25/2000, 05/31/2007, 05/17/2009, 06/06/2010   Influenza, High Dose Seasonal PF 05/22/2016, 06/08/2017, 07/03/2018, 04/19/2019, 06/18/2020   Influenza,inj,Quad PF,6+ Mos 08/09/2013, 05/11/2014, 05/24/2015   Influenza-Unspecified 07/05/2017, 04/19/2019   PFIZER(Purple Top)SARS-COV-2 Vaccination 10/16/2019, 11/09/2019, 07/09/2020   Pneumococcal Conjugate-13 10/09/2015   Pneumococcal Polysaccharide-23 05/11/2014   Td 06/25/1998, 04/05/2008   Tdap 04/17/2019    TDAP status: Up to date  Flu Vaccine status: Declined, Education has been provided regarding the importance of this vaccine but patient still declined. Advised may receive this vaccine at local pharmacy or Health Dept. Aware to provide a copy of the vaccination record if obtained from local pharmacy or Health Dept. Verbalized acceptance and understanding.  Pneumococcal vaccine status: Up to date  Covid-19 vaccine  status: Information provided on how to obtain vaccines.   Qualifies for Shingles Vaccine? Yes   Zostavax completed No   Shingrix Completed?: No.    Education has been provided regarding the importance of this vaccine. Patient has been advised to call insurance company to determine out of pocket expense if they have not yet received this vaccine. Advised may also receive vaccine at local pharmacy or Health Dept. Verbalized acceptance and  understanding.  Screening Tests Health Maintenance  Topic Date Due   Zoster Vaccines- Shingrix (1 of 2) Never done   COVID-19 Vaccine (4 - 2023-24 season) 04/25/2022   INFLUENZA VACCINE  03/26/2023   Colonoscopy  11/03/2023   Medicare Annual Wellness (AWV)  03/23/2024   DTaP/Tdap/Td (4 - Td or Tdap) 04/16/2029   Pneumonia Vaccine 49+ Years old  Completed   Hepatitis C Screening  Completed   HPV VACCINES  Aged Out    Health Maintenance  Health Maintenance Due  Topic Date Due   Zoster Vaccines- Shingrix (1 of 2) Never done   COVID-19 Vaccine (4 - 2023-24 season) 04/25/2022   INFLUENZA VACCINE  03/26/2023    Colorectal cancer screening: Type of screening: Colonoscopy. Completed 11/02/13. Repeat every 10 years  Additional Screening:  Hepatitis C Screening:  Completed 01/10/16  Vision Screening: Recommended annual ophthalmology exams for early detection of glaucoma and other disorders of the eye. Is the patient up to date with their annual eye exam?  Yes  Who is the provider or what is the name of the office in which the patient attends annual eye exams? Dr Nile Riggs / will be Dr Dione Booze for follow up  If pt is not established with a provider, would they like to be referred to a provider to establish care? No .   Dental Screening: Recommended annual dental exams for proper oral hygiene   Community Resource Referral / Chronic Care Management: CRR required this visit?  No   CCM required this visit?  No     Plan:     I have personally reviewed and noted the following in the patient's chart:   Medical and social history Use of alcohol, tobacco or illicit drugs  Current medications and supplements including opioid prescriptions. Patient is not currently taking opioid prescriptions. Functional ability and status Nutritional status Physical activity Advanced directives List of other physicians Hospitalizations, surgeries, and ER visits in previous 12  months Vitals Screenings to include cognitive, depression, and falls Referrals and appointments  In addition, I have reviewed and discussed with patient certain preventive protocols, quality metrics, and best practice recommendations. A written personalized care plan for preventive services as well as general preventive health recommendations were provided to patient.     [name], LPN   08/30/1094   After Visit Summary: (MyChart) Due to this being a telephonic visit, the after visit summary with patients personalized plan was offered to patient via MyChart   Nurse Notes: none

## 2023-04-02 ENCOUNTER — Other Ambulatory Visit (INDEPENDENT_AMBULATORY_CARE_PROVIDER_SITE_OTHER): Payer: Medicare PPO

## 2023-04-02 DIAGNOSIS — M10079 Idiopathic gout, unspecified ankle and foot: Secondary | ICD-10-CM | POA: Diagnosis not present

## 2023-04-02 DIAGNOSIS — N401 Enlarged prostate with lower urinary tract symptoms: Secondary | ICD-10-CM

## 2023-04-02 DIAGNOSIS — E785 Hyperlipidemia, unspecified: Secondary | ICD-10-CM | POA: Diagnosis not present

## 2023-04-02 DIAGNOSIS — R739 Hyperglycemia, unspecified: Secondary | ICD-10-CM | POA: Diagnosis not present

## 2023-04-02 DIAGNOSIS — R351 Nocturia: Secondary | ICD-10-CM | POA: Diagnosis not present

## 2023-04-02 LAB — CBC WITH DIFFERENTIAL/PLATELET
Basophils Absolute: 0 10*3/uL (ref 0.0–0.1)
Basophils Relative: 0.3 % (ref 0.0–3.0)
Eosinophils Absolute: 0.2 10*3/uL (ref 0.0–0.7)
Eosinophils Relative: 2.6 % (ref 0.0–5.0)
HCT: 40.1 % (ref 39.0–52.0)
Hemoglobin: 13.4 g/dL (ref 13.0–17.0)
Lymphocytes Relative: 20.4 % (ref 12.0–46.0)
Lymphs Abs: 1.2 10*3/uL (ref 0.7–4.0)
MCHC: 33.4 g/dL (ref 30.0–36.0)
MCV: 94.3 fl (ref 78.0–100.0)
Monocytes Absolute: 0.6 10*3/uL (ref 0.1–1.0)
Monocytes Relative: 9.3 % (ref 3.0–12.0)
Neutro Abs: 4.1 10*3/uL (ref 1.4–7.7)
Neutrophils Relative %: 67.4 % (ref 43.0–77.0)
Platelets: 214 10*3/uL (ref 150.0–400.0)
RBC: 4.26 Mil/uL (ref 4.22–5.81)
RDW: 13.3 % (ref 11.5–15.5)
WBC: 6.1 10*3/uL (ref 4.0–10.5)

## 2023-04-02 LAB — COMPREHENSIVE METABOLIC PANEL
ALT: 22 U/L (ref 0–53)
AST: 21 U/L (ref 0–37)
Albumin: 4.2 g/dL (ref 3.5–5.2)
Alkaline Phosphatase: 32 U/L — ABNORMAL LOW (ref 39–117)
BUN: 21 mg/dL (ref 6–23)
CO2: 29 mEq/L (ref 19–32)
Calcium: 9 mg/dL (ref 8.4–10.5)
Chloride: 100 mEq/L (ref 96–112)
Creatinine, Ser: 1.27 mg/dL (ref 0.40–1.50)
GFR: 55.22 mL/min — ABNORMAL LOW (ref >60.00)
Glucose, Bld: 93 mg/dL (ref 70–99)
Potassium: 4 mEq/L (ref 3.5–5.1)
Sodium: 134 mEq/L — ABNORMAL LOW (ref 135–145)
Total Bilirubin: 0.6 mg/dL (ref 0.2–1.2)
Total Protein: 5.9 g/dL — ABNORMAL LOW (ref 6.0–8.3)

## 2023-04-02 LAB — LIPID PANEL
Cholesterol: 146 mg/dL (ref 0–200)
HDL: 46.8 mg/dL (ref >39.00)
LDL Cholesterol: 72 mg/dL (ref 0–99)
NonHDL: 98.98
Total CHOL/HDL Ratio: 3
Triglycerides: 135 mg/dL (ref 0.0–149.0)
VLDL: 27 mg/dL (ref 0.0–40.0)

## 2023-04-02 LAB — URIC ACID: Uric Acid, Serum: 5.2 mg/dL (ref 4.0–7.8)

## 2023-04-02 LAB — HEMOGLOBIN A1C: Hgb A1c MFr Bld: 5.8 % (ref 4.6–6.5)

## 2023-04-02 LAB — PSA: PSA: 3.48 ng/mL (ref 0.10–4.00)

## 2023-04-07 ENCOUNTER — Encounter: Payer: Self-pay | Admitting: Family Medicine

## 2023-04-07 ENCOUNTER — Ambulatory Visit (INDEPENDENT_AMBULATORY_CARE_PROVIDER_SITE_OTHER): Payer: Medicare PPO | Admitting: Family Medicine

## 2023-04-07 VITALS — BP 110/62 | HR 63 | Temp 97.5°F | Ht 65.0 in | Wt 163.0 lb

## 2023-04-07 DIAGNOSIS — Z0000 Encounter for general adult medical examination without abnormal findings: Secondary | ICD-10-CM

## 2023-04-07 DIAGNOSIS — M10079 Idiopathic gout, unspecified ankle and foot: Secondary | ICD-10-CM | POA: Diagnosis not present

## 2023-04-07 DIAGNOSIS — I10 Essential (primary) hypertension: Secondary | ICD-10-CM | POA: Diagnosis not present

## 2023-04-07 DIAGNOSIS — R739 Hyperglycemia, unspecified: Secondary | ICD-10-CM

## 2023-04-07 DIAGNOSIS — E785 Hyperlipidemia, unspecified: Secondary | ICD-10-CM

## 2023-04-07 DIAGNOSIS — N183 Chronic kidney disease, stage 3 unspecified: Secondary | ICD-10-CM | POA: Diagnosis not present

## 2023-04-07 NOTE — Addendum Note (Signed)
Addended by: Shelva Majestic on: 04/07/2023 09:00 PM   Modules accepted: Orders

## 2023-04-07 NOTE — Addendum Note (Signed)
Addended by: Gwenette Greet on: 04/07/2023 01:31 PM   Modules accepted: Orders

## 2023-04-07 NOTE — Progress Notes (Signed)
Phone: [phone]   Subjective:  Patient presents today for their annual physical. Chief complaint-noted.   See problem oriented charting- ROS- full  review of systems was completed and negative  Per full ROS sheet completed by patient  The following were reviewed and entered/updated in epic: Past Medical History:  Diagnosis Date   BPH associated with nocturia 10/03/2014   Depression    Hyperlipidemia    Hypertension    PSA, INCREASED 04/16/2010   History prostatitis x 2, # decreased with ciprofloxacin     Rosacea    Patient Active Problem List   Diagnosis Date Noted   Hyperglycemia 10/03/2014    Priority: Medium    BPH associated with nocturia 10/03/2014    Priority: Medium    Gout 06/21/2009    Priority: Medium    Chronic kidney disease, stage III (moderate) (HCC) 04/05/2008    Priority: Medium    Hypertension 05/31/2007    Priority: Medium    Hyperlipidemia 05/24/2007    Priority: Medium    Seasonal allergies 05/11/2014    Priority: Low   PSA, INCREASED 04/16/2010    Priority: Low   DEPRESSION 05/24/2007    Priority: Low   Past Surgical History:  Procedure Laterality Date   FLEXIBLE SIGMOIDOSCOPY     TONSILLECTOMY     at around 77-10 yrs old   WISDOM TOOTH EXTRACTION      Family History  Problem Relation Age of Onset   Heart disease Father        MI 78, smoker   Colon cancer Neg Hx    Esophageal cancer Neg Hx    Rectal cancer Neg Hx    Stomach cancer Neg Hx     Medications- reviewed and updated Current Outpatient Medications  Medication Sig Dispense Refill   allopurinol (ZYLOPRIM) 100 MG tablet TAKE 2 TABLETS BY MOUTH EVERY DAY 180 tablet 3   aspirin 81 MG tablet Take 81 mg by mouth daily.     atorvastatin (LIPITOR) 40 MG tablet TAKE 1 TABLET BY MOUTH EVERY DAY 90 tablet 3   Biotin 5000 MCG TABS Take 1 tablet by mouth.     Calcium Carbonate-Vitamin D (CALCIUM-VITAMIN D) 500-200 MG-UNIT tablet Take 1 tablet by mouth daily.     cyanocobalamin 1000  MCG tablet Take 1,000 mcg by mouth daily.     ferrous sulfate 325 (65 FE) MG tablet Take 325 mg by mouth daily with breakfast.     glucosamine-chondroitin 500-400 MG tablet Take 1 tablet by mouth 3 (three) times daily.     lisinopril (ZESTRIL) 5 MG tablet TAKE 1 TABLET BY MOUTH EVERY DAY 90 tablet 3   Multiple Vitamins-Minerals (CENTRUM SILVER PO) Take 1 tablet by mouth daily.      No current facility-administered medications for this visit.    Allergies-reviewed and updated No Known Allergies  Social History   Social History Narrative   Married 1978. Son from first marriage [name] (1971-psychiatrist in [place]). No grandkids.    Lost last of 2 dogs in 2024-sheltie/collie mix and border collie mix      Retired from Special educational needs teacher      Hobbies: time with dogs, exercising   Objective  Objective:  BP 110/62   Pulse 63   Temp (!) 97.5 F (36.4 C)   Ht 5\' 5"  (1.651 m)   Wt 163 lb (73.9 kg)   SpO2 95%   BMI 27.12 kg/m  Gen: NAD, resting comfortably HEENT: Mucous membranes are moist. Oropharynx normal  Neck: no thyromegaly CV: RRR no murmurs rubs or gallops Lungs: CTAB no crackles, wheeze, rhonchi Abdomen: soft/nontender/nondistended/normal bowel sounds. No rebound or guarding.  Ext: no edema Skin: warm, dry Neuro: grossly normal, moves all extremities, PERRLA   Assessment and Plan  77 y.o. male presenting for annual physical.  Health Maintenance counseling: 1. Anticipatory guidance: Patient counseled regarding regular dental exams -q6 months, eye exams -yearly,  avoiding smoking and second hand smoke , limiting alcohol to 2 beverages per day -1 coors light per week, no illicit drugs .   2. Risk factor reduction:  Advised patient of need for regular exercise and diet rich and fruits and vegetables to reduce risk of heart attack and stroke.  Exercise- 1.5- 2 hours in gym 3x a week usually.  Diet/weight management-down 11 lbs from last year- continues  gradual weight loss! .  Wt Readings from Last 3 Encounters:  04/07/23 163 lb (73.9 kg)  03/24/23 161 lb (73 kg)  09/29/22 171 lb (77.6 kg)  3. Immunizations/screenings/ancillary studies-declines Shingrix, COVID. May get flu shot later - undecided- opts out for now  Immunization History  Administered Date(s) Administered   Influenza Split 06/05/2011, 05/06/2012   Influenza Whole 08/25/2000, 05/31/2007, 05/17/2009, 06/06/2010   Influenza, High Dose Seasonal PF 05/22/2016, 06/08/2017, 07/03/2018, 04/19/2019, 06/18/2020   Influenza,inj,Quad PF,6+ Mos 08/09/2013, 05/11/2014, 05/24/2015   Influenza-Unspecified 07/05/2017, 04/19/2019   PFIZER(Purple Top)SARS-COV-2 Vaccination 10/16/2019, 11/09/2019, 07/09/2020   Pneumococcal Conjugate-13 10/09/2015   Pneumococcal Polysaccharide-23 05/11/2014   Td 06/25/1998, 04/05/2008   Tdap 04/17/2019  4. Prostate cancer screening-  referred to urology in the past but trended back down and we have followed here- roughtly stable here Lab Results  Component Value Date   PSA 3.48 04/02/2023   PSA 3.23 03/31/2022   PSA 3.05 03/28/2021  5. Colon cancer screening - 11/02/2013 with 10 year repeat planned  6. Skin cancer screening- no dermatologist. advised regular sunscreen use. Denies worrisome, changing, or new skin lesions.  7. Smoking associated screening (lung cancer screening, AAA screen 65-75, UA)- Never smoker  8. STD screening - only active with wife   Status of chronic or acute concerns   # Tingling in left greater than right hand last visit-radiated up to his elbow and had negative Tinel and Phalen with no midline neck pain but recurrence of symptoms with tilting head and positive Spurling-he wanted to try physical therapy at [facility], physical therapy [facility] or [facility] or see Eulah Pont wainer if not improving- reports today went to physical therapy and then even saw Dr. Dion Saucier- he has some exercises that he can do at  his gym and has been pretty stable. Reports even had x-ray. He wants to give more time but if worsens or fails to improve further . Thought to be cervical in etiology. Doing some alpha lipoic acid  # overweight/ hyperglycemia/prediabetes-peak A1c at least 6.1 S: see above  Lab Results  Component Value Date   HGBA1C 5.8 04/02/2023   HGBA1C 5.9 09/23/2022   HGBA1C 6.0 03/31/2022  A/P: for prediabetes/hyperglycemia-  well controlled- continue healthy lifestyle focus  #hypertension/CKD III S: compliant with lisinopril 5mg    GFR has been stable in 50s. Ace I in case proteinuric element  BP Readings from Last 3 Encounters:  04/07/23 110/62  09/29/22 100/70  04/01/22 130/60  A/P: hypertension stable- continue current medicines  CKD III- hopefully stable- update  today. Continue without meds for now   #hyperlipidemia S:  Compliant with atorvastatin  40mg   Lab Results  Component Value Date   CHOL 146 04/02/2023   HDL 46.80 04/02/2023   LDLCALC 72 04/02/2023   LDLDIRECT 96.0 01/20/2019   TRIG 135.0 04/02/2023   CHOLHDL 3 04/02/2023   A/P:  close to ideal goal- continue current medications   #Gout S: 0 flares in last 6 months on allopurinol 200mg  per day Lab Results  Component Value Date   LABURIC 5.2 04/02/2023  A/P:  stable- continue current medicines   #sodium hair low- monitor as well as low protein- states "was trying to be good" and may hve reduced caloric intake and salt  Recommended follow up: Return in about 6 months (around 10/08/2023) for followup or sooner if needed.Schedule b4 you leave. Future Appointments  Date Time Provider Department Center  03/29/2024  3:15 PM [facility] ANNUAL WELLNESS VISIT 1 [facility]   Lab/Order associations:already had fasting labs   ICD-10-CM   1. Preventative health care  Z00.00     2. Hyperlipidemia, unspecified hyperlipidemia type  E78.5     3. Primary hypertension  I10     4. Idiopathic gout of foot, unspecified chronicity,  unspecified laterality  M10.079     5. Stage 3 chronic kidney disease, unspecified whether stage 3a or 3b CKD (HCC)  N18.30     6. Hyperglycemia  R73.9       No orders of the defined types were placed in this encounter.   Return precautions advised.  Tana Conch, MD

## 2023-04-07 NOTE — Patient Instructions (Addendum)
Let us know if you get a flu or COVID vaccine this fall or shingrix if change your mind  Glad you are doing so well! Keep up the great work!   Recommended follow up: Return in about 6 months (around 10/08/2023) for followup or sooner if needed.Schedule b4 you leave.

## 2023-05-19 ENCOUNTER — Other Ambulatory Visit: Payer: Self-pay | Admitting: Family Medicine

## 2023-08-17 ENCOUNTER — Other Ambulatory Visit: Payer: Self-pay | Admitting: Family Medicine

## 2023-09-30 ENCOUNTER — Other Ambulatory Visit: Payer: Self-pay | Admitting: Family Medicine

## 2023-10-01 ENCOUNTER — Other Ambulatory Visit (INDEPENDENT_AMBULATORY_CARE_PROVIDER_SITE_OTHER): Payer: Medicare PPO

## 2023-10-01 ENCOUNTER — Encounter: Payer: Self-pay | Admitting: Family Medicine

## 2023-10-01 DIAGNOSIS — R739 Hyperglycemia, unspecified: Secondary | ICD-10-CM | POA: Diagnosis not present

## 2023-10-01 DIAGNOSIS — I10 Essential (primary) hypertension: Secondary | ICD-10-CM | POA: Diagnosis not present

## 2023-10-01 LAB — COMPREHENSIVE METABOLIC PANEL
ALT: 25 U/L (ref 0–53)
AST: 24 U/L (ref 0–37)
Albumin: 4.5 g/dL (ref 3.5–5.2)
Alkaline Phosphatase: 36 U/L — ABNORMAL LOW (ref 39–117)
BUN: 24 mg/dL — ABNORMAL HIGH (ref 6–23)
CO2: 27 meq/L (ref 19–32)
Calcium: 9.1 mg/dL (ref 8.4–10.5)
Chloride: 104 meq/L (ref 96–112)
Creatinine, Ser: 1.18 mg/dL (ref 0.40–1.50)
GFR: 60.1 mL/min (ref >60.00)
Glucose, Bld: 97 mg/dL (ref 70–99)
Potassium: 4.3 meq/L (ref 3.5–5.1)
Sodium: 139 meq/L (ref 135–145)
Total Bilirubin: 0.6 mg/dL (ref 0.2–1.2)
Total Protein: 6.6 g/dL (ref 6.0–8.3)

## 2023-10-01 LAB — HEMOGLOBIN A1C: Hgb A1c MFr Bld: 5.9 % (ref 4.6–6.5)

## 2023-10-08 ENCOUNTER — Encounter: Payer: Self-pay | Admitting: Family Medicine

## 2023-10-08 ENCOUNTER — Ambulatory Visit: Payer: Medicare PPO | Admitting: Family Medicine

## 2023-10-08 ENCOUNTER — Telehealth: Payer: Self-pay | Admitting: Family Medicine

## 2023-10-08 VITALS — BP 122/60 | HR 64 | Temp 97.9°F | Ht 65.0 in | Wt 162.2 lb

## 2023-10-08 DIAGNOSIS — M10079 Idiopathic gout, unspecified ankle and foot: Secondary | ICD-10-CM

## 2023-10-08 DIAGNOSIS — R7303 Prediabetes: Secondary | ICD-10-CM

## 2023-10-08 DIAGNOSIS — E785 Hyperlipidemia, unspecified: Secondary | ICD-10-CM

## 2023-10-08 DIAGNOSIS — N401 Enlarged prostate with lower urinary tract symptoms: Secondary | ICD-10-CM

## 2023-10-08 DIAGNOSIS — I10 Essential (primary) hypertension: Secondary | ICD-10-CM | POA: Diagnosis not present

## 2023-10-08 DIAGNOSIS — R351 Nocturia: Secondary | ICD-10-CM | POA: Diagnosis not present

## 2023-10-08 DIAGNOSIS — Z0000 Encounter for general adult medical examination without abnormal findings: Secondary | ICD-10-CM

## 2023-10-08 DIAGNOSIS — N183 Chronic kidney disease, stage 3 unspecified: Secondary | ICD-10-CM | POA: Diagnosis not present

## 2023-10-08 DIAGNOSIS — R972 Elevated prostate specific antigen [PSA]: Secondary | ICD-10-CM

## 2023-10-08 NOTE — Progress Notes (Signed)
Phone [phone] In person visit   Subjective:   Robert Gaines is a 78 y.o. year old very pleasant male patient who presents for/with See problem oriented charting Chief Complaint  Patient presents with   Follow-up    Pt here for 6 mth followup - pt does not have any concerns    Depression   Hypertension   Hyperlipidemia    Past Medical History-  Patient Active Problem List   Diagnosis Date Noted   Hyperglycemia 10/03/2014    Priority: Medium    BPH associated with nocturia 10/03/2014    Priority: Medium    Gout 06/21/2009    Priority: Medium    Chronic kidney disease, stage III (moderate) (HCC) 04/05/2008    Priority: Medium    Hypertension 05/31/2007    Priority: Medium    Hyperlipidemia 05/24/2007    Priority: Medium    Seasonal allergies 05/11/2014    Priority: Low   PSA, INCREASED 04/16/2010    Priority: Low   DEPRESSION 05/24/2007    Priority: Low    Medications- reviewed and updated Current Outpatient Medications  Medication Sig Dispense Refill   allopurinol (ZYLOPRIM) 100 MG tablet TAKE 2 TABLETS BY MOUTH EVERY DAY 180 tablet 3   aspirin 81 MG tablet Take 81 mg by mouth daily.     atorvastatin (LIPITOR) 40 MG tablet TAKE 1 TABLET BY MOUTH EVERY DAY 90 tablet 3   Biotin 5000 MCG TABS Take 1 tablet by mouth.     Calcium Carbonate-Vitamin D (CALCIUM-VITAMIN D) 500-200 MG-UNIT tablet Take 1 tablet by mouth daily.     cyanocobalamin 1000 MCG tablet Take 1,000 mcg by mouth daily.     ferrous sulfate 325 (65 FE) MG tablet Take 325 mg by mouth daily with breakfast.     glucosamine-chondroitin 500-400 MG tablet Take 1 tablet by mouth 3 (three) times daily.     lisinopril (ZESTRIL) 5 MG tablet TAKE 1 TABLET BY MOUTH EVERY DAY 90 tablet 3   Multiple Vitamins-Minerals (CENTRUM SILVER PO) Take 1 tablet by mouth daily.      No current facility-administered medications for this visit.     Objective:  BP 122/60   Pulse 64   Temp 97.9 F (36.6 C)   Ht 5\' 5"   (1.651 m)   Wt 162 lb 3.2 oz (73.6 kg)   SpO2 97%   BMI 26.99 kg/m  Gen: NAD, resting comfortably CV: RRR no murmurs rubs or gallops Lungs: CTAB no crackles, wheeze, rhonchi Ext: no edema Skin: warm, dry     Assessment and Plan   # overweight/ hyperglycemia/prediabetes-peak A1c at least 6.1 S: consistent with gym still.s table and actually down 1 pounds from last visit Lab Results  Component Value Date   HGBA1C 5.9 10/01/2023   HGBA1C 5.8 04/02/2023   HGBA1C 5.9 09/23/2022  A/P: for prediabetes/hyperglycemia- overall stable - continue to monitor and work on [place] diet/exercise For overweight- wgiht down slightly- keep up efforts  #hypertension/CKD III S: compliant with lisinopril 5mg   GFR has been stable in 50's- actually just over 60 on last check. Ace I in case proteinuric element  BP Readings from Last 3 Encounters:  10/08/23 122/60  04/07/23 110/62  09/29/22 100/70  A/P: CKD III actually mildly improved- with GFR just over 60- if maintains this level may take this diagnosis in future- did encourage good hydration with high BUN/creatinine ratio Hypertension- well controlled continue current medications   #hyperlipidemia S:  Compliant with atorvastatin 40mg   Lab Results  Component Value Date   CHOL 146 04/02/2023   HDL 46.80 04/02/2023   LDLCALC 72 04/02/2023   LDLDIRECT 96.0 01/20/2019   TRIG 135.0 04/02/2023   CHOLHDL 3 04/02/2023   A/P: very close to ideal goal last visit- update next visit- we did discuss that the statins do slightly increase diabetes risk- so thankfully he's been able to keep his a1c down  #Gout S: no flares in last 6 months on allopurinol 200mg  per day Lab Results  Component Value Date   LABURIC 5.2 04/02/2023  A/P: uric acid well controlled  and no flares- continue current medications   #Prior neck issues that included tingling into hands back into 2024- worked with Dr. Dion Saucier and neck issues got better. Also did physical therapy  back last year and had some improvement.  - he did see Dr. Dion Saucier again 12/01/22 for bilateral upper extremity tingling without neck pain- had prior steroid shots and not sure it was helpful. He still thought could be radiculopathy-  and recommended physical therapy. Next step could be nerve conduction study but he wants to hold off. He feels it Is better though so wants to monitor  Recommended follow up: Return in about 6 months (around 04/06/2024) for physical or sooner if needed.Schedule b4 you leave. Future Appointments  Date Time Provider Department Center  03/29/2024  3:15 PM [facility] ANNUAL WELLNESS VISIT 1 [facility]   Lab/Order associations:   ICD-10-CM   1. Idiopathic gout of foot, unspecified chronicity, unspecified laterality  M10.079     2. Primary hypertension  I10     3. Hyperlipidemia, unspecified hyperlipidemia type  E78.5     4. Prediabetes  R73.03     5. BPH associated with nocturia  N40.1    R35.1     6. Stage 3 chronic kidney disease, unspecified whether stage 3a or 3b CKD (HCC) Chronic N18.30       No orders of the defined types were placed in this encounter.   Return precautions advised.  Tana Conch, MD

## 2023-10-08 NOTE — Patient Instructions (Addendum)
Glad you are doing so well- may need nerve conduction study if numbness/tingling issues worsen  Keep up the great job with exercise  Recommended follow up: Return in about 6 months (around 04/06/2024) for physical or sooner if needed.Schedule b4 you leave.

## 2023-10-08 NOTE — Addendum Note (Signed)
Addended by: Shelva Majestic on: 10/08/2023 06:00 PM   Modules accepted: Level of Service

## 2023-10-08 NOTE — Telephone Encounter (Signed)
Future labs have been ordered, ok to schedule future lab.

## 2023-10-08 NOTE — Telephone Encounter (Signed)
Pt scheduled for CPE on 04/12/24 and would like labs done prior so they can be discussed at time of visit. Can these labs be ordered as future?

## 2023-10-08 NOTE — Telephone Encounter (Signed)
Admin see below.

## 2023-10-08 NOTE — Telephone Encounter (Signed)
LVM to schedule lab only visit prior to CPE

## 2024-02-08 DIAGNOSIS — D225 Melanocytic nevi of trunk: Secondary | ICD-10-CM | POA: Diagnosis not present

## 2024-02-08 DIAGNOSIS — D2271 Melanocytic nevi of right lower limb, including hip: Secondary | ICD-10-CM | POA: Diagnosis not present

## 2024-02-08 DIAGNOSIS — L570 Actinic keratosis: Secondary | ICD-10-CM | POA: Diagnosis not present

## 2024-02-08 DIAGNOSIS — D2262 Melanocytic nevi of left upper limb, including shoulder: Secondary | ICD-10-CM | POA: Diagnosis not present

## 2024-02-08 DIAGNOSIS — D2372 Other benign neoplasm of skin of left lower limb, including hip: Secondary | ICD-10-CM | POA: Diagnosis not present

## 2024-02-08 DIAGNOSIS — L821 Other seborrheic keratosis: Secondary | ICD-10-CM | POA: Diagnosis not present

## 2024-02-08 DIAGNOSIS — L814 Other melanin hyperpigmentation: Secondary | ICD-10-CM | POA: Diagnosis not present

## 2024-02-08 DIAGNOSIS — D2261 Melanocytic nevi of right upper limb, including shoulder: Secondary | ICD-10-CM | POA: Diagnosis not present

## 2024-03-29 ENCOUNTER — Ambulatory Visit (INDEPENDENT_AMBULATORY_CARE_PROVIDER_SITE_OTHER): Payer: Medicare PPO

## 2024-03-29 VITALS — Ht 66.0 in | Wt 162.0 lb

## 2024-03-29 DIAGNOSIS — Z0000 Encounter for general adult medical examination without abnormal findings: Secondary | ICD-10-CM

## 2024-03-29 NOTE — Patient Instructions (Signed)
 Mr. Klippel , Thank you for taking time out of your busy schedule to complete your Annual Wellness Visit with me. I enjoyed our conversation and look forward to speaking with you again next year. I, as well as your care team,  appreciate your ongoing commitment to your health goals. Please review the following plan we discussed and let me know if I can assist you in the future. Your Game plan/ To Do List    Referrals: If you haven't heard from the office you've been referred to, please reach out to them at the phone provided.   Follow up Visits: We will see or speak with you next year for your Next Medicare AWV with our clinical staff Have you seen your provider in the last 6 months (3 months if uncontrolled diabetes)? Yes  Clinician Recommendations:  Aim for 30 minutes of exercise or brisk walking, 6-8 glasses of water, and 5 servings of fruits and vegetables each day.       This is a list of the screenings recommended for you:  Health Maintenance  Topic Date Due   Zoster (Shingles) Vaccine (1 of 2) Never done   COVID-19 Vaccine (4 - 2024-25 season) 04/26/2023   Medicare Annual Wellness Visit  03/23/2024   Flu Shot  03/25/2024   DTaP/Tdap/Td vaccine (4 - Td or Tdap) 04/16/2029   Pneumococcal Vaccine for age over 74  Completed   Hepatitis C Screening  Completed   Hepatitis B Vaccine  Aged Out   HPV Vaccine  Aged Out   Meningitis B Vaccine  Aged Out   Colon Cancer Screening  Discontinued    Advanced directives: (Copy Requested) Please bring a copy of your health care power of attorney and living will to the office to be added to your chart at your convenience. You can mail to [facility] [address] [place], [place] [address] or email to [email or web address] Advance Care Planning is important because it:  [x]  Makes sure you receive the medical care that is consistent with your values, goals, and preferences  [x]  It provides guidance to your family and loved  ones and reduces their decisional burden about whether or not they are making the right decisions based on your wishes.  Follow the link provided in your after visit summary or read over the paperwork we have mailed to you to help you started getting your Advance Directives in place. If you need assistance in completing these, please reach out to us  so that we can help you!  See attachments for Preventive Care and Fall Prevention Tips.

## 2024-03-29 NOTE — Progress Notes (Signed)
 Subjective:   Robert Gaines is a 78 y.o. who presents for a Medicare Wellness preventive visit.  As a reminder, Annual Wellness Visits don't include a physical exam, and some assessments may be limited, especially if this visit is performed virtually. We may recommend an in-person follow-up visit with your provider if needed.  Visit Complete: Virtual I connected with  [name] on 03/29/24 by a audio enabled telemedicine application and verified that I am speaking with the correct person using two identifiers.  Patient Location: Home  Provider Location: Office/Clinic  I discussed the limitations of evaluation and management by telemedicine. The patient expressed understanding and agreed to proceed.  Vital Signs: Because this visit was a virtual/telehealth visit, some criteria may be missing or patient reported. Any vitals not documented were not able to be obtained and vitals that have been documented are patient reported.  VideoDeclined- This patient declined Librarian, academic. Therefore the visit was completed with audio only.  Persons Participating in Visit: Patient.  AWV Questionnaire: Yes: Patient Medicare AWV questionnaire was completed by the patient on 03/25/24; I have confirmed that all information answered by patient is correct and no changes since this date.  Cardiac Risk Factors include: advanced age (>15men, >84 women);dyslipidemia;hypertension;male gender     Objective:    Today's Vitals   03/29/24 1518  Weight: 162 lb (73.5 kg)  Height: 5' 6 (1.676 m)   Body mass index is 26.15 kg/m.     03/29/2024    3:20 PM 03/24/2023    2:47 PM 12/22/2022    9:35 AM 04/04/2022    9:22 AM 02/23/2020   12:27 PM 11/29/2015    8:58 AM 11/20/2015    6:07 PM  Advanced Directives  Does Patient Have a Medical Advance Directive? Yes Yes Yes Yes Yes No  No   Type of Estate agent of [place];Living will Healthcare Power of  [place];Living will Healthcare Power of [employer] Power of Attorney Living will;Healthcare Power of Attorney    Does patient want to make changes to medical advance directive?   No - Patient declined      Copy of Healthcare Power of Attorney in Chart? No - copy requested No - copy requested  No - copy requested No - copy requested    Would patient like information on creating a medical advance directive?      No - patient declined information  No - patient declined information      Data saved with a previous flowsheet row definition    Current Medications (verified) Outpatient Encounter Medications as of 03/29/2024  Medication Sig   allopurinol  (ZYLOPRIM ) 100 MG tablet TAKE 2 TABLETS BY MOUTH EVERY DAY   aspirin 81 MG tablet Take 81 mg by mouth daily.   atorvastatin  (LIPITOR) 40 MG tablet TAKE 1 TABLET BY MOUTH EVERY DAY   Biotin 5000 MCG TABS Take 1 tablet by mouth.   Calcium  Carbonate-Vitamin D (CALCIUM -VITAMIN D) 500-200 MG-UNIT tablet Take 1 tablet by mouth daily.   cyanocobalamin 1000 MCG tablet Take 1,000 mcg by mouth daily.   ferrous sulfate 325 (65 FE) MG tablet Take 325 mg by mouth daily with breakfast.   glucosamine-chondroitin 500-400 MG tablet Take 1 tablet by mouth 3 (three) times daily.   lisinopril  (ZESTRIL ) 5 MG tablet TAKE 1 TABLET BY MOUTH EVERY DAY   Multiple Vitamins-Minerals (CENTRUM SILVER PO) Take 1 tablet by mouth daily.    No facility-administered encounter medications on file  as of 03/29/2024.    Allergies (verified) Patient has no known allergies.   History: Past Medical History:  Diagnosis Date   BPH associated with nocturia 10/03/2014   Depression    Hyperlipidemia    Hypertension    PSA, INCREASED 04/16/2010   History prostatitis x 2, # decreased with ciprofloxacin      Rosacea    Past Surgical History:  Procedure Laterality Date   FLEXIBLE SIGMOIDOSCOPY     TONSILLECTOMY     at around 74-9 yrs old   WISDOM TOOTH EXTRACTION     Family  History  Problem Relation Age of Onset   Heart disease Father        MI 31, smoker   Colon cancer Neg Hx    Esophageal cancer Neg Hx    Rectal cancer Neg Hx    Stomach cancer Neg Hx    Social History   Socioeconomic History   Marital status: Married    Spouse name: Not on file   Number of children: Not on file   Years of education: Not on file   Highest education level: Bachelor's degree (e.g., BA, AB, BS)  Occupational History   Occupation: Retired  Tobacco Use   Smoking status: Never   Smokeless tobacco: Never  Substance and Sexual Activity   Alcohol use: Yes    Comment: occas   Drug use: No   Sexual activity: Not on file  Other Topics Concern   Not on file  Social History Narrative   Married 1978. Son from first marriage Robert Gaines (1971-psychiatrist in [place]). No grandkids.    Lost last of 2 dogs in 2024-sheltie/collie mix and border collie mix      Retired from Special educational needs teacher      Hobbies: time with dogs, exercising   Social Drivers of Corporate investment banker Strain: Low Risk  (03/25/2024)   Overall Financial Resource Strain (CARDIA)    Difficulty of Paying Living Expenses: Not hard at all  Food Insecurity: No Food Insecurity (03/25/2024)   Hunger Vital Sign    Worried About Running Out of Food in the Last Year: Never true    Ran Out of Food in the Last Year: Never true  Transportation Needs: No Transportation Needs (03/25/2024)   PRAPARE - Administrator, Civil Service (Medical): No    Lack of Transportation (Non-Medical): No  Physical Activity: Sufficiently Active (03/25/2024)   Exercise Vital Sign    Days of Exercise per Week: 4 days    Minutes of Exercise per Session: 90 min  Stress: No Stress Concern Present (03/25/2024)   [employer] of Occupational Health - Occupational Stress Questionnaire    Feeling of Stress: Not at all  Social Connections: Moderately Integrated (03/25/2024)   Social Connection and Isolation Panel     Frequency of Communication with Friends and Family: More than three times a week    Frequency of Social Gatherings with Friends and Family: Three times a week    Attends Religious Services: 1 to 4 times per year    Active Member of Clubs or Organizations: No    Attends Engineer, structural: Not on file    Marital Status: Married    Tobacco Counseling Counseling given: Not Answered    Clinical Intake:  Pre-visit preparation completed: Yes  Pain : No/denies pain     BMI - recorded: 26.15 Nutritional Status: BMI 25 -29 Overweight Nutritional Risks: None Diabetes: No  Lab Results  Component  Value Date   HGBA1C 5.9 10/01/2023   HGBA1C 5.8 04/02/2023   HGBA1C 5.9 09/23/2022     How often do you need to have someone help you when you read instructions, pamphlets, or other written materials from your doctor or pharmacy?: 1 - Never  Interpreter Needed?: No  Information entered by :: Ellouise Haws, LPN   Activities of Daily Living     03/25/2024    3:57 PM  In your present state of health, do you have any difficulty performing the following activities:  Hearing? 0  Vision? 0  Difficulty concentrating or making decisions? 0  Walking or climbing stairs? 0  Dressing or bathing? 0  Doing errands, shopping? 0  Preparing Food and eating ? N  Using the Toilet? N  In the past six months, have you accidently leaked urine? N  Do you have problems with loss of bowel control? N  Managing your Medications? N  Managing your Finances? N  Housekeeping or managing your Housekeeping? N    Patient Care Team: Katrinka Garnette KIDD, MD as PCP - General (Family Medicine)  I have updated your Care Teams any recent Medical Services you may have received from other providers in the past year.     Assessment:   This is a routine wellness examination for [employer].  Hearing/Vision screen Hearing Screening - Comments:: Pt denies any hearing issues  Vision Screening - Comments::  Wears rx glasses - up to date with routine eye exams with  Dr Robinson Morita opthalmology    Goals Addressed             This Visit's Progress    Patient Stated       Maintain health and activity        Depression Screen     03/29/2024    3:20 PM 10/08/2023   10:19 AM 04/07/2023   12:53 PM 03/24/2023    2:48 PM 04/04/2022    9:21 AM 10/02/2021    8:21 AM 09/13/2020    9:09 AM  PHQ 2/9 Scores  PHQ - 2 Score 0 0 0 0 0 0 0  PHQ- 9 Score 0  0   0     Fall Risk     03/25/2024    3:57 PM 10/08/2023   10:18 AM 04/07/2023   12:53 PM 03/20/2023    9:54 AM 04/04/2022    9:23 AM  Fall Risk   Falls in the past year? 0 0 0 0 0  Number falls in past yr:  0 0 0 0  Injury with Fall?  0 0 0 0  Risk for fall due to : No Fall Risks No Fall Risks No Fall Risks Impaired vision   Follow up Falls prevention discussed Falls evaluation completed Falls evaluation completed Falls prevention discussed Falls prevention discussed      Data saved with a previous flowsheet row definition    MEDICARE RISK AT HOME:  Medicare Risk at Home Any stairs in or around the home?: (Patient-Rptd) Yes If so, are there any without handrails?: (Patient-Rptd) No Home free of loose throw rugs in walkways, pet beds, electrical cords, etc?: (Patient-Rptd) Yes Adequate lighting in your home to reduce risk of falls?: (Patient-Rptd) Yes Life alert?: (Patient-Rptd) No Use of a cane, walker or w/c?: (Patient-Rptd) No Grab bars in the bathroom?: (Patient-Rptd) No Shower chair or bench in shower?: (Patient-Rptd) No Elevated toilet seat or a handicapped toilet?: (Patient-Rptd) No  TIMED UP AND GO:  Was the  test performed?  No  Cognitive Function: 6CIT completed        03/29/2024    3:22 PM 03/24/2023    2:51 PM 04/04/2022    9:24 AM 02/23/2020   12:34 PM  6CIT Screen  What Year? 0 points 0 points 0 points 0 points  What month? 0 points 0 points 0 points 0 points  What time? 0 points 0 points 0 points 0 points  Count  back from 20 0 points 0 points 0 points 0 points  Months in reverse 0 points 0 points 0 points 0 points  Repeat phrase 0 points 0 points 0 points   Total Score 0 points 0 points 0 points     Immunizations Immunization History  Administered Date(s) Administered   Influenza Split 06/05/2011, 05/06/2012   Influenza Whole 08/25/2000, 05/31/2007, 05/17/2009, 06/06/2010   Influenza, High Dose Seasonal PF 05/22/2016, 06/08/2017, 07/03/2018, 04/19/2019, 06/18/2020   Influenza,inj,Quad PF,6+ Mos 08/09/2013, 05/11/2014, 05/24/2015   Influenza-Unspecified 07/05/2017, 04/19/2019   PFIZER(Purple Top)SARS-COV-2 Vaccination 10/16/2019, 11/09/2019, 07/09/2020   Pneumococcal Conjugate-13 10/09/2015   Pneumococcal Polysaccharide-23 05/11/2014   Td 06/25/1998, 04/05/2008   Tdap 04/17/2019    Screening Tests Health Maintenance  Topic Date Due   Zoster Vaccines- Shingrix (1 of 2) Never done   COVID-19 Vaccine (4 - 2024-25 season) 04/26/2023   INFLUENZA VACCINE  03/25/2024   Medicare Annual Wellness (AWV)  03/29/2025   DTaP/Tdap/Td (4 - Td or Tdap) 04/16/2029   Pneumococcal Vaccine: 50+ Years  Completed   Hepatitis C Screening  Completed   Hepatitis B Vaccines  Aged Out   HPV VACCINES  Aged Out   Meningococcal B Vaccine  Aged Out   Colonoscopy  Discontinued    Health Maintenance  Health Maintenance Due  Topic Date Due   Zoster Vaccines- Shingrix (1 of 2) Never done   COVID-19 Vaccine (4 - 2024-25 season) 04/26/2023   INFLUENZA VACCINE  03/25/2024   Health Maintenance Items Addressed: See Nurse Notes at the end of this note  Additional Screening:  Vision Screening: Recommended annual ophthalmology exams for early detection of glaucoma and other disorders of the eye. Would you like a referral to an eye doctor? No    Dental Screening: Recommended annual dental exams for proper oral hygiene  Community Resource Referral / Chronic Care Management: CRR required this visit?  No   CCM  required this visit?  No   Plan:    I have personally reviewed and noted the following in the patient's chart:   Medical and social history Use of alcohol, tobacco or illicit drugs  Current medications and supplements including opioid prescriptions. Patient is not currently taking opioid prescriptions. Functional ability and status Nutritional status Physical activity Advanced directives List of other physicians Hospitalizations, surgeries, and ER visits in previous 12 months Vitals Screenings to include cognitive, depression, and falls Referrals and appointments  In addition, I have reviewed and discussed with patient certain preventive protocols, quality metrics, and best practice recommendations. A written personalized care plan for preventive services as well as general preventive health recommendations were provided to patient.   Ellouise VEAR Haws, LPN   08/28/7972   After Visit Summary: (MyChart) Due to this being a telephonic visit, the after visit summary with patients personalized plan was offered to patient via MyChart   Notes: Nothing significant to report at this time.

## 2024-04-04 ENCOUNTER — Other Ambulatory Visit (INDEPENDENT_AMBULATORY_CARE_PROVIDER_SITE_OTHER): Payer: Medicare PPO

## 2024-04-04 ENCOUNTER — Ambulatory Visit: Payer: Self-pay | Admitting: Family Medicine

## 2024-04-04 DIAGNOSIS — E785 Hyperlipidemia, unspecified: Secondary | ICD-10-CM

## 2024-04-04 DIAGNOSIS — R972 Elevated prostate specific antigen [PSA]: Secondary | ICD-10-CM

## 2024-04-04 DIAGNOSIS — Z0000 Encounter for general adult medical examination without abnormal findings: Secondary | ICD-10-CM | POA: Diagnosis not present

## 2024-04-04 DIAGNOSIS — I10 Essential (primary) hypertension: Secondary | ICD-10-CM

## 2024-04-04 DIAGNOSIS — R7303 Prediabetes: Secondary | ICD-10-CM

## 2024-04-04 LAB — COMPREHENSIVE METABOLIC PANEL WITH GFR
ALT: 18 U/L (ref 0–53)
AST: 19 U/L (ref 0–37)
Albumin: 4.7 g/dL (ref 3.5–5.2)
Alkaline Phosphatase: 36 U/L — ABNORMAL LOW (ref 39–117)
BUN: 22 mg/dL (ref 6–23)
CO2: 27 meq/L (ref 19–32)
Calcium: 10 mg/dL (ref 8.4–10.5)
Chloride: 104 meq/L (ref 96–112)
Creatinine, Ser: 1.22 mg/dL (ref 0.40–1.50)
GFR: 57.53 mL/min — ABNORMAL LOW (ref >60.00)
Glucose, Bld: 98 mg/dL (ref 70–99)
Potassium: 4.4 meq/L (ref 3.5–5.1)
Sodium: 140 meq/L (ref 135–145)
Total Bilirubin: 0.7 mg/dL (ref 0.2–1.2)
Total Protein: 6.3 g/dL (ref 6.0–8.3)

## 2024-04-04 LAB — LIPID PANEL
Cholesterol: 149 mg/dL (ref 0–200)
HDL: 46.5 mg/dL (ref >39.00)
LDL Cholesterol: 71 mg/dL (ref 0–99)
NonHDL: 102.91
Total CHOL/HDL Ratio: 3
Triglycerides: 160 mg/dL — ABNORMAL HIGH (ref 0.0–149.0)
VLDL: 32 mg/dL (ref 0.0–40.0)

## 2024-04-04 LAB — CBC WITH DIFFERENTIAL/PLATELET
Basophils Absolute: 0 K/uL (ref 0.0–0.1)
Basophils Relative: 0.5 % (ref 0.0–3.0)
Eosinophils Absolute: 0.1 K/uL (ref 0.0–0.7)
Eosinophils Relative: 1.8 % (ref 0.0–5.0)
HCT: 41.4 % (ref 39.0–52.0)
Hemoglobin: 14.2 g/dL (ref 13.0–17.0)
Lymphocytes Relative: 23.1 % (ref 12.0–46.0)
Lymphs Abs: 1.3 K/uL (ref 0.7–4.0)
MCHC: 34.2 g/dL (ref 30.0–36.0)
MCV: 93.1 fl (ref 78.0–100.0)
Monocytes Absolute: 0.5 K/uL (ref 0.1–1.0)
Monocytes Relative: 8.4 % (ref 3.0–12.0)
Neutro Abs: 3.6 K/uL (ref 1.4–7.7)
Neutrophils Relative %: 66.2 % (ref 43.0–77.0)
Platelets: 219 K/uL (ref 150.0–400.0)
RBC: 4.45 Mil/uL (ref 4.22–5.81)
RDW: 13.4 % (ref 11.5–15.5)
WBC: 5.4 K/uL (ref 4.0–10.5)

## 2024-04-04 LAB — HEMOGLOBIN A1C: Hgb A1c MFr Bld: 5.9 % (ref 4.6–6.5)

## 2024-04-04 LAB — TSH: TSH: 1.97 u[IU]/mL (ref 0.35–5.50)

## 2024-04-04 LAB — PSA: PSA: 3.49 ng/mL (ref 0.10–4.00)

## 2024-04-12 ENCOUNTER — Encounter: Payer: Self-pay | Admitting: Family Medicine

## 2024-04-12 ENCOUNTER — Ambulatory Visit (INDEPENDENT_AMBULATORY_CARE_PROVIDER_SITE_OTHER): Payer: Medicare PPO | Admitting: Family Medicine

## 2024-04-12 VITALS — BP 108/62 | HR 61 | Temp 97.2°F | Ht 66.0 in | Wt 163.4 lb

## 2024-04-12 DIAGNOSIS — E785 Hyperlipidemia, unspecified: Secondary | ICD-10-CM

## 2024-04-12 DIAGNOSIS — Z125 Encounter for screening for malignant neoplasm of prostate: Secondary | ICD-10-CM | POA: Diagnosis not present

## 2024-04-12 DIAGNOSIS — Z131 Encounter for screening for diabetes mellitus: Secondary | ICD-10-CM

## 2024-04-12 DIAGNOSIS — I10 Essential (primary) hypertension: Secondary | ICD-10-CM

## 2024-04-12 DIAGNOSIS — Z0000 Encounter for general adult medical examination without abnormal findings: Secondary | ICD-10-CM | POA: Diagnosis not present

## 2024-04-12 DIAGNOSIS — R7303 Prediabetes: Secondary | ICD-10-CM | POA: Diagnosis not present

## 2024-04-12 DIAGNOSIS — N183 Chronic kidney disease, stage 3 unspecified: Secondary | ICD-10-CM

## 2024-04-12 NOTE — Progress Notes (Signed)
 Phone: [phone]   Subjective:  Patient presents today for their annual physical. Chief complaint-noted.   See problem oriented charting- ROS- full  review of systems was completed and negative  Per full ROS sheet completed by patient except for topics noted under acute/chronic concerns   The following were reviewed and entered/updated in epic: Past Medical History:  Diagnosis Date   BPH associated with nocturia 10/03/2014   Hyperlipidemia    Hypertension    PSA, INCREASED 04/16/2010   History prostatitis x 2, # decreased with ciprofloxacin      Rosacea    Patient Active Problem List   Diagnosis Date Noted   Hyperglycemia 10/03/2014    Priority: Medium    BPH associated with nocturia 10/03/2014    Priority: Medium    Gout 06/21/2009    Priority: Medium    Chronic kidney disease, stage III (moderate) (HCC) 04/05/2008    Priority: Medium    Hypertension 05/31/2007    Priority: Medium    Hyperlipidemia 05/24/2007    Priority: Medium    Seasonal allergies 05/11/2014    Priority: Low   PSA, INCREASED 04/16/2010    Priority: Low   Past Surgical History:  Procedure Laterality Date   FLEXIBLE SIGMOIDOSCOPY     TONSILLECTOMY     at around 54-9 yrs old   WISDOM TOOTH EXTRACTION      Family History  Problem Relation Age of Onset   Heart disease Father        MI 36, smoker   Colon cancer Neg Hx    Esophageal cancer Neg Hx    Rectal cancer Neg Hx    Stomach cancer Neg Hx     Medications- reviewed and updated Current Outpatient Medications  Medication Sig Dispense Refill   allopurinol  (ZYLOPRIM ) 100 MG tablet TAKE 2 TABLETS BY MOUTH EVERY DAY 180 tablet 3   aspirin 81 MG tablet Take 81 mg by mouth daily.     atorvastatin  (LIPITOR) 40 MG tablet TAKE 1 TABLET BY MOUTH EVERY DAY 90 tablet 3   Biotin 5000 MCG TABS Take 1 tablet by mouth.     Calcium  Carbonate-Vitamin D (CALCIUM -VITAMIN D) 500-200 MG-UNIT tablet Take 1 tablet by mouth daily.     cyanocobalamin 1000  MCG tablet Take 1,000 mcg by mouth daily.     ferrous sulfate 325 (65 FE) MG tablet Take 325 mg by mouth daily with breakfast.     glucosamine-chondroitin 500-400 MG tablet Take 1 tablet by mouth 3 (three) times daily.     lisinopril  (ZESTRIL ) 5 MG tablet TAKE 1 TABLET BY MOUTH EVERY DAY 90 tablet 3   Multiple Vitamins-Minerals (CENTRUM SILVER PO) Take 1 tablet by mouth daily.      No current facility-administered medications for this visit.    Allergies-reviewed and updated No Known Allergies  Social History   Social History Narrative   Married 1978. Son from first marriage Beverley Seip (1971-psychiatrist in [place]). No grandkids.    Lost last of 2 dogs in 2024-sheltie/collie mix and border collie mix      Retired from [employer] educational needs teacher      Hobbies: time with dogs, exercising   Objective  Objective:  BP 108/62 (BP Location: Left Arm, Patient Position: Sitting, Cuff Size: Normal)   Pulse 61   Temp (!) 97.2 F (36.2 C) (Temporal)   Ht 5' 6 (1.676 m)   Wt 163 lb 6.4 oz (74.1 kg)   SpO2 96%   BMI 26.37 kg/m  Gen: NAD, resting  comfortably HEENT: Mucous membranes are moist. Oropharynx normal Neck: no thyromegaly CV: RRR no murmurs rubs or gallops Lungs: CTAB no crackles, wheeze, rhonchi Abdomen: soft/nontender/nondistended/normal bowel sounds. No rebound or guarding.  Ext: no edema Skin: warm, dry Neuro: grossly normal, moves all extremities, PERRLA    Assessment and Plan  78 y.o. male presenting for annual physical.  Health Maintenance counseling: 1. Anticipatory guidance: Patient counseled regarding regular dental exams -q6 months, eye exams - getting new eye doctor- yearly,  avoiding smoking and second hand smoke , limiting alcohol to 2 beverages per day - 1 michelob ultra a week or 2 , no illicit drugs .   2. Risk factor reduction:  Advised patient of need for regular exercise and diet rich and fruits and vegetables to reduce risk of heart attack and  stroke.  Exercise-1/2 to 2 hours in the gym 3 times a week usually still.  Diet/weight management-has done a great job maintaining weight.  Wt Readings from Last 3 Encounters:  04/12/24 163 lb 6.4 oz (74.1 kg)  03/29/24 162 lb (73.5 kg)  10/08/23 162 lb 3.2 oz (73.6 kg)  3. Immunizations/screenings/ancillary studies-consider fall flu and COVID shot.  Declines Shingrix  Immunization History  Administered Date(s) Administered   Influenza Split 06/05/2011, 05/06/2012   Influenza Whole 08/25/2000, 05/31/2007, 05/17/2009, 06/06/2010   Influenza, High Dose Seasonal PF 05/22/2016, 06/08/2017, 07/03/2018, 04/19/2019, 06/18/2020   Influenza,inj,Quad PF,6+ Mos 08/09/2013, 05/11/2014, 05/24/2015   Influenza-Unspecified 07/05/2017, 04/19/2019   PFIZER(Purple Top)SARS-COV-2 Vaccination 10/16/2019, 11/09/2019, 07/09/2020   Pneumococcal Conjugate-13 10/09/2015   Pneumococcal Polysaccharide-23 05/11/2014   Td 06/25/1998, 04/05/2008   Tdap 04/17/2019  4. Prostate cancer screening-  low risk PSA trend noted-has seen urology in the past with elevations but numbers trended back down and have been stable in the threes  Lab Results  Component Value Date   PSA 3.49 04/04/2024   PSA 3.48 04/02/2023   PSA 3.23 03/31/2022   5. Colon cancer screening - normal colonoscopy 11/02/2013 and past age 32 he prefers to hold off unless blood in stool or melena 6. Skin cancer screening-has seen Dr. Lynnell- overall good report. advised regular sunscreen use. Denies worrisome, changing, or new skin lesions.  7. Smoking associated screening (lung cancer screening, AAA screen 65-75, UA)- never smoker 8. STD screening - only active with wife  Status of chronic or acute concerns   # overweight/ hyperglycemia/prediabetes-peak A1c at least 6.1 S: none  Lab Results  Component Value Date   HGBA1C 5.9 04/04/2024   HGBA1C 5.9 10/01/2023   HGBA1C 5.8 04/02/2023  A/P: for prediabetes/hyperglycemia- keep up great efforts with  healthy eating and regular exercise - has maintained this  #hypertension/CKD III S: compliant with lisinopril  5mg    GFR has been stable in 50s. Ace I in case proteinuric element . Most recent GFR at 57.  BP Readings from Last 3 Encounters:  04/12/24 108/62  10/08/23 122/60  04/07/23 110/62  A/P: hypertension well controlled continue current medications  CKD III stable- continue current medicines   #hyperlipidemia S:  Compliant with atorvastatin  40mg   Lab Results  Component Value Date   CHOL 149 04/04/2024   HDL 46.50 04/04/2024   LDLCALC 71 04/04/2024   LDLDIRECT 96.0 01/20/2019   TRIG 160.0 (H) 04/04/2024   CHOLHDL 3 04/04/2024   A/P: very close to ideal goal 70 or less for LDL. Triglyceride(s) hair high- keep working on healthy eating and regular exercise   #Gout S: 0 flares in last 6 months on allopurinol  200mg   per day Lab Results  Component Value Date   LABURIC 5.2 04/02/2023  A/P: stable- continue current medicines   # Prior neck issues include tingling in hands document 2024-he has wanted to hold off on nerve conduction study.  Had seen Dr. Josefina most recently-was improved at last visit and remains better today- some tingling in hands  Recommended follow up: Return in about 6 months (around 10/13/2024) for followup or sooner if needed.Schedule b4 you leave. Future Appointments  Date Time Provider Department Center  04/03/2025  3:00 PM [facility] ANNUAL WELLNESS VISIT 1 [facility]   Lab/Order associations:   ICD-10-CM   1. Preventative health care  Z00.00     2. Primary hypertension  I10     3. Prediabetes  R73.03     4. Screening for diabetes mellitus  Z13.1     5. Screening for prostate cancer  Z12.5     6. Stage 3 chronic kidney disease, unspecified whether stage 3a or 3b CKD (HCC)  N18.30     7. Hyperlipidemia, unspecified hyperlipidemia type  E78.5       No orders of the defined types were placed in this encounter.   Return precautions advised.   Garnette Lukes, MD

## 2024-04-12 NOTE — Patient Instructions (Addendum)
 No changes- glad you are doing so well  Schedule labs at least February 19th then can see me a few days later  Recommended follow up: Return in about 6 months (around 10/13/2024) for followup or sooner if needed.Schedule b4 you leave.

## 2024-06-03 ENCOUNTER — Other Ambulatory Visit: Payer: Self-pay | Admitting: Family Medicine

## 2024-08-13 ENCOUNTER — Emergency Department (HOSPITAL_BASED_OUTPATIENT_CLINIC_OR_DEPARTMENT_OTHER)

## 2024-08-13 ENCOUNTER — Other Ambulatory Visit: Payer: Self-pay

## 2024-08-13 ENCOUNTER — Emergency Department (HOSPITAL_BASED_OUTPATIENT_CLINIC_OR_DEPARTMENT_OTHER)
Admission: EM | Admit: 2024-08-13 | Discharge: 2024-08-13 | Disposition: A | Discharge status: Home / Self Care | Attending: Emergency Medicine | Admitting: Emergency Medicine

## 2024-08-13 ENCOUNTER — Encounter (HOSPITAL_BASED_OUTPATIENT_CLINIC_OR_DEPARTMENT_OTHER): Payer: Self-pay | Admitting: Emergency Medicine

## 2024-08-13 DIAGNOSIS — K5900 Constipation, unspecified: Secondary | ICD-10-CM | POA: Diagnosis present

## 2024-08-13 DIAGNOSIS — K5641 Fecal impaction: Secondary | ICD-10-CM | POA: Insufficient documentation

## 2024-08-13 DIAGNOSIS — Z7982 Long term (current) use of aspirin: Secondary | ICD-10-CM | POA: Diagnosis not present

## 2024-08-13 LAB — CBC WITH DIFFERENTIAL/PLATELET
Abs Immature Granulocytes: 0.08 K/uL — ABNORMAL HIGH (ref 0.00–0.07)
Basophils Absolute: 0 K/uL (ref 0.0–0.1)
Basophils Relative: 0 %
Eosinophils Absolute: 0 K/uL (ref 0.0–0.5)
Eosinophils Relative: 0 %
HCT: 42.6 % (ref 39.0–52.0)
Hemoglobin: 14.8 g/dL (ref 13.0–17.0)
Immature Granulocytes: 0 %
Lymphocytes Relative: 4 %
Lymphs Abs: 0.8 K/uL (ref 0.7–4.0)
MCH: 31.8 pg (ref 26.0–34.0)
MCHC: 34.7 g/dL (ref 30.0–36.0)
MCV: 91.4 fL (ref 80.0–100.0)
Monocytes Absolute: 0.6 K/uL (ref 0.1–1.0)
Monocytes Relative: 3 %
Neutro Abs: 17.2 K/uL — ABNORMAL HIGH (ref 1.7–7.7)
Neutrophils Relative %: 93 %
Platelets: 243 K/uL (ref 150–400)
RBC: 4.66 MIL/uL (ref 4.22–5.81)
RDW: 12.4 % (ref 11.5–15.5)
WBC: 18.7 K/uL — ABNORMAL HIGH (ref 4.0–10.5)
nRBC: 0 % (ref 0.0–0.2)

## 2024-08-13 LAB — COMPREHENSIVE METABOLIC PANEL WITH GFR
ALT: 24 U/L (ref 0–44)
AST: 27 U/L (ref 15–41)
Albumin: 5 g/dL (ref 3.5–5.0)
Alkaline Phosphatase: 48 U/L (ref 38–126)
Anion gap: 16 — ABNORMAL HIGH (ref 5–15)
BUN: 20 mg/dL (ref 8–23)
CO2: 22 mmol/L (ref 22–32)
Calcium: 9.5 mg/dL (ref 8.9–10.3)
Chloride: 100 mmol/L (ref 98–111)
Creatinine, Ser: 1.2 mg/dL (ref 0.61–1.24)
GFR, Estimated: >60 mL/min
Glucose, Bld: 139 mg/dL — ABNORMAL HIGH (ref 70–99)
Potassium: 4.3 mmol/L (ref 3.5–5.1)
Sodium: 138 mmol/L (ref 135–145)
Total Bilirubin: 0.5 mg/dL (ref 0.0–1.2)
Total Protein: 7.1 g/dL (ref 6.5–8.1)

## 2024-08-13 MED ORDER — FLEET ENEMA RE ENEM
1.0000 | ENEMA | Freq: Once | RECTAL | Status: AC
  Administered 2024-08-13: 1 via RECTAL
  Filled 2024-08-13: qty 1

## 2024-08-13 MED ORDER — IOHEXOL 300 MG/ML  SOLN
100.0000 mL | Freq: Once | INTRAMUSCULAR | Status: AC | PRN
  Administered 2024-08-13: 100 mL via INTRAVENOUS

## 2024-08-13 NOTE — Discharge Instructions (Signed)
 As we discussed, use Miralax 3 times daily (for maximum of 3 days), a stool softener such as Senekot as directed, to relieve constipation. Increase dietary fiber, drink lots of water. If these measures do not relieve constipation, you can consider using Magnesium Citrate and/or Fleet enema.  REturn to the ED with any severe abdominal pain, rectal bleeding, vomiting, fever or new concern.

## 2024-08-13 NOTE — ED Triage Notes (Signed)
 Pt reports feels a lot of rectal pressure after trying to have BM this morning; had normal BM yesterday; has taken several laxatives at home PTA

## 2024-08-13 NOTE — ED Notes (Signed)
Patient transported to imaging at this time.

## 2024-08-13 NOTE — ED Notes (Signed)
 Patient unable to have a BM after enema. Provider notified.

## 2024-08-13 NOTE — ED Notes (Signed)
 ED Provider at bedside.

## 2024-08-13 NOTE — ED Provider Notes (Signed)
 " [place] [facility] Provider Note   CSN: [id number] Arrival date & time: 08/13/24  1358     Patient presents with: Constipation   Robert Gaines is a 78 y.o. male.   Patient to ED with wife at bedside, for evaluation of rectal pain. He reports he feels there is stool there causing painful pressure but he is unable to pass it despite use of Miralax, metamucil. Normal bowel movement yesterday. No history of constipation issues. No abdominal surgeries previously and no abdominal pain, nausea, vomiting today. No recent dietary changes. No fever.   The history is provided by the patient. No language interpreter was used.  Constipation      Prior to Admission medications  Medication Sig Start Date End Date Taking? Authorizing Provider  allopurinol  (ZYLOPRIM ) 100 MG tablet TAKE 2 TABLETS BY MOUTH EVERY DAY 06/03/24   Katrinka Garnette KIDD, MD  aspirin 81 MG tablet Take 81 mg by mouth daily.    [provider]  atorvastatin  (LIPITOR) 40 MG tablet TAKE 1 TABLET BY MOUTH EVERY DAY 09/30/23   Katrinka Garnette KIDD, MD  Biotin 5000 MCG TABS Take 1 tablet by mouth.    [provider]  Calcium  Carbonate-Vitamin D (CALCIUM -VITAMIN D) 500-200 MG-UNIT tablet Take 1 tablet by mouth daily.    [provider]  cyanocobalamin 1000 MCG tablet Take 1,000 mcg by mouth daily.    [provider]  ferrous sulfate 325 (65 FE) MG tablet Take 325 mg by mouth daily with breakfast.    [provider]  glucosamine-chondroitin 500-400 MG tablet Take 1 tablet by mouth 3 (three) times daily.    [provider]  lisinopril  (ZESTRIL ) 5 MG tablet TAKE 1 TABLET BY MOUTH EVERY DAY 08/18/23   Katrinka Garnette KIDD, MD  Multiple Vitamins-Minerals (CENTRUM SILVER PO) Take 1 tablet by mouth daily.     [provider]    Allergies: Patient has no known allergies.    Review of Systems  Gastrointestinal:  Positive for constipation.     Updated Vital Signs BP (!) 148/68 (BP Location: Right Arm)   Pulse 81   Temp 97.8 F (36.6 C) (Oral)   Resp 20   Ht 5' 5 (1.651 m)   Wt 73.5 kg   SpO2 100%   BMI 26.96 kg/m   Physical Exam Constitutional:      Appearance: He is well-developed.  Pulmonary:     Effort: Pulmonary effort is normal.  Abdominal:     General: There is no distension.     Palpations: Abdomen is soft.     Tenderness: There is no abdominal tenderness.  Genitourinary:    Comments: Fecal impaction present without melena.  Musculoskeletal:        General: Normal range of motion.     Cervical back: Normal range of motion.  Skin:    General: Skin is warm and dry.  Neurological:     Mental Status: He is alert and oriented to person, place, and time.     (all labs ordered are listed, but only abnormal results are displayed) Labs Reviewed  COMPREHENSIVE METABOLIC PANEL WITH GFR - Abnormal; Notable for the following components:      Result Value   Glucose, Bld 139 (*)    Anion gap 16 (*)    All other components within normal limits  CBC WITH DIFFERENTIAL/PLATELET - Abnormal; Notable for the following components:   WBC 18.7 (*)    Neutro Abs  17.2 (*)    Abs Immature Granulocytes 0.08 (*)    All other components within normal limits    EKG: None  Radiology: CT ABDOMEN PELVIS W CONTRAST Result Date: 08/13/2024 CLINICAL DATA:  Diverticulitis complication suspected. EXAM: CT ABDOMEN AND PELVIS WITH CONTRAST TECHNIQUE: Multidetector CT imaging of the abdomen and pelvis was performed using the standard protocol following bolus administration of intravenous contrast. RADIATION DOSE REDUCTION: This exam was performed according to the departmental dose-optimization program which includes automated exposure control, adjustment of the mA and/or kV according to patient size and/or use of iterative reconstruction technique. CONTRAST:  OMNIPAQUE  IOHEXOL  300 MG/ML  SOLN COMPARISON:  CT abdomen pelvis  dated 11/20/2015. FINDINGS: Lower chest: The visualized lung bases are clear. No intra-abdominal free air or free fluid. Hepatobiliary: No focal liver abnormality is seen. No gallstones, gallbladder wall thickening, or biliary dilatation. Pancreas: Unremarkable. No pancreatic ductal dilatation or surrounding inflammatory changes. Spleen: Normal in size without focal abnormality. Adrenals/Urinary Tract: The adrenal glands unremarkable. Right renal cysts measure up to 6.5 cm in the lower pole. There is no hydronephrosis on either side. There is symmetric enhancement and excretion of contrast by both kidneys. The visualized ureters and urinary bladder unremarkable. Stomach/Bowel: There is no bowel obstruction or active inflammation. There is moderate colonic stool burden. The appendix is not visualized with certainty. No inflammatory changes identified in the right lower quadrant. Vascular/Lymphatic: Mild aortoiliac atherosclerotic disease. The IVC is unremarkable. No portal venous gas. There is no adenopathy. Reproductive: The prostate and seminal vesicles are grossly unremarkable. Other: None Musculoskeletal: Osteopenia with degenerative changes of the spine. No acute osseous pathology. IMPRESSION: 1. No acute intra-abdominal or pelvic pathology. 2. Moderate colonic stool burden. No bowel obstruction. 3.  Aortic Atherosclerosis (ICD10-I70.0). Electronically Signed   By: Vanetta Chou M.D.   On: 08/13/2024 17:17     Procedures   Medications Ordered in the ED  sodium phosphate (FLEET) enema 1 enema (1 enema Rectal Given 08/13/24 1722)  iohexol  (OMNIPAQUE ) 300 MG/ML solution 100 mL (100 mLs Intravenous Contrast Given 08/13/24 1704)    Clinical Course as of 08/14/24 0007  Sat Aug 13, 2024  1549 Disimpaction attempted removing some stool. Will go back after his requested break. [SU]  1631 Further disimpaction accomplished to finger depth. Fleet enema ordered. CT obtained. [SU]  Sun Aug 14, 2024  0004 CT  per radiology:  IMPRESSION: 1. No acute intra-abdominal or pelvic pathology. 2. Moderate colonic stool burden. No bowel obstruction. 3.  Aortic Atherosclerosis (ICD10-I70.0).   Discussed continued treatment for constipation at home with high dose Miralax, Senekot. Suggested Fleets and/or Mag Citrate if other measures fail. F/U GI or PCP.  Return precautions discussed.  [SU]    Clinical Course User Index [SU] Odell Balls, PA-C                                 Medical Decision Making Amount and/or Complexity of Data Reviewed Labs: ordered. Radiology: ordered.  Risk OTC drugs. Prescription drug management.        Final diagnoses:  Constipation, unspecified constipation type  Fecal impaction in rectum [facility])    ED Discharge Orders     None          Odell Balls, PA-C 08/14/24 0007    Elnor Jayson LABOR, DO 08/22/24 1650  "

## 2024-08-13 NOTE — ED Notes (Signed)
 Per provider, wait until after CT scan is performed to administered enema.

## 2024-08-16 ENCOUNTER — Telehealth: Payer: Self-pay

## 2024-08-16 NOTE — Telephone Encounter (Signed)
 Transition Care Management Unsuccessful Follow-up Telephone Call  Date of discharge and from where:  08/13/24 [facility]  Attempts:  1st Attempt  Reason for unsuccessful TCM follow-up call:  Unable to leave message

## 2024-09-03 ENCOUNTER — Other Ambulatory Visit: Payer: Self-pay | Admitting: Family Medicine

## 2024-10-13 ENCOUNTER — Other Ambulatory Visit

## 2024-10-20 ENCOUNTER — Ambulatory Visit: Admitting: Family Medicine

## 2025-04-03 ENCOUNTER — Ambulatory Visit

## 2025-04-19 ENCOUNTER — Encounter: Admitting: Family Medicine
